# Patient Record
Sex: Male | Born: 1940 | Race: Black or African American | Hispanic: No | Smoking: Former smoker
Health system: Southern US, Community
[De-identification: ages and names within clinical notes are randomized; demographics above are authoritative.]

## PROBLEM LIST (undated history)

## (undated) DIAGNOSIS — M1710 Unilateral primary osteoarthritis, unspecified knee: Secondary | ICD-10-CM

## (undated) DIAGNOSIS — K5730 Diverticulosis of large intestine without perforation or abscess without bleeding: Secondary | ICD-10-CM

## (undated) DIAGNOSIS — N138 Other obstructive and reflux uropathy: Secondary | ICD-10-CM

## (undated) DIAGNOSIS — I10 Essential (primary) hypertension: Secondary | ICD-10-CM

## (undated) DIAGNOSIS — Z8739 Personal history of other diseases of the musculoskeletal system and connective tissue: Secondary | ICD-10-CM

## (undated) DIAGNOSIS — M179 Osteoarthritis of knee, unspecified: Secondary | ICD-10-CM

## (undated) DIAGNOSIS — R911 Solitary pulmonary nodule: Secondary | ICD-10-CM

## (undated) DIAGNOSIS — Z87898 Personal history of other specified conditions: Secondary | ICD-10-CM

## (undated) DIAGNOSIS — I341 Nonrheumatic mitral (valve) prolapse: Secondary | ICD-10-CM

## (undated) DIAGNOSIS — N401 Enlarged prostate with lower urinary tract symptoms: Secondary | ICD-10-CM

## (undated) DIAGNOSIS — I4891 Unspecified atrial fibrillation: Secondary | ICD-10-CM

## (undated) DIAGNOSIS — Z8551 Personal history of malignant neoplasm of bladder: Secondary | ICD-10-CM

## (undated) DIAGNOSIS — I340 Nonrheumatic mitral (valve) insufficiency: Secondary | ICD-10-CM

## (undated) HISTORY — DX: Diverticulosis of large intestine without perforation or abscess without bleeding: K57.30

## (undated) HISTORY — DX: Unspecified atrial fibrillation: I48.91

## (undated) HISTORY — DX: Solitary pulmonary nodule: R91.1

## (undated) HISTORY — PX: TONSILLECTOMY: SUR1361

## (undated) HISTORY — DX: Essential (primary) hypertension: I10

---

## 1997-07-07 ENCOUNTER — Encounter: Admission: RE | Admit: 1997-07-07 | Discharge: 1997-07-07 | Payer: Self-pay | Admitting: Hematology and Oncology

## 1998-04-06 ENCOUNTER — Encounter: Admission: RE | Admit: 1998-04-06 | Discharge: 1998-04-06 | Payer: Self-pay | Admitting: Internal Medicine

## 1998-04-22 ENCOUNTER — Encounter: Admission: RE | Admit: 1998-04-22 | Discharge: 1998-04-22 | Payer: Self-pay | Admitting: Internal Medicine

## 1998-05-09 ENCOUNTER — Encounter: Admission: RE | Admit: 1998-05-09 | Discharge: 1998-05-09 | Payer: Self-pay | Admitting: Internal Medicine

## 1998-08-09 ENCOUNTER — Encounter: Admission: RE | Admit: 1998-08-09 | Discharge: 1998-08-09 | Payer: Self-pay | Admitting: Internal Medicine

## 1998-09-20 ENCOUNTER — Encounter: Admission: RE | Admit: 1998-09-20 | Discharge: 1998-09-20 | Payer: Self-pay | Admitting: Hematology and Oncology

## 1999-03-09 ENCOUNTER — Encounter: Payer: Self-pay | Admitting: Emergency Medicine

## 1999-03-09 ENCOUNTER — Emergency Department (HOSPITAL_COMMUNITY): Admission: EM | Admit: 1999-03-09 | Discharge: 1999-03-09 | Payer: Self-pay | Admitting: Emergency Medicine

## 1999-03-28 ENCOUNTER — Encounter: Admission: RE | Admit: 1999-03-28 | Discharge: 1999-03-28 | Payer: Self-pay | Admitting: Internal Medicine

## 1999-12-12 ENCOUNTER — Encounter: Admission: RE | Admit: 1999-12-12 | Discharge: 1999-12-12 | Payer: Self-pay | Admitting: Internal Medicine

## 1999-12-26 ENCOUNTER — Encounter: Admission: RE | Admit: 1999-12-26 | Discharge: 1999-12-26 | Payer: Self-pay | Admitting: Hematology and Oncology

## 2000-01-12 ENCOUNTER — Encounter: Admission: RE | Admit: 2000-01-12 | Discharge: 2000-01-12 | Payer: Self-pay | Admitting: Internal Medicine

## 2001-02-05 ENCOUNTER — Emergency Department (HOSPITAL_COMMUNITY): Admission: EM | Admit: 2001-02-05 | Discharge: 2001-02-05 | Payer: Self-pay | Admitting: Emergency Medicine

## 2001-02-26 ENCOUNTER — Encounter: Admission: RE | Admit: 2001-02-26 | Discharge: 2001-02-26 | Payer: Self-pay | Admitting: Internal Medicine

## 2001-04-02 ENCOUNTER — Encounter: Admission: RE | Admit: 2001-04-02 | Discharge: 2001-04-02 | Payer: Self-pay | Admitting: Internal Medicine

## 2001-05-21 ENCOUNTER — Encounter: Admission: RE | Admit: 2001-05-21 | Discharge: 2001-05-21 | Payer: Self-pay

## 2002-03-18 ENCOUNTER — Encounter: Admission: RE | Admit: 2002-03-18 | Discharge: 2002-03-18 | Payer: Self-pay | Admitting: Internal Medicine

## 2003-06-15 ENCOUNTER — Encounter: Admission: RE | Admit: 2003-06-15 | Discharge: 2003-06-15 | Payer: Self-pay | Admitting: Internal Medicine

## 2003-09-08 ENCOUNTER — Encounter: Admission: RE | Admit: 2003-09-08 | Discharge: 2003-09-08 | Payer: Self-pay | Admitting: Internal Medicine

## 2004-08-29 ENCOUNTER — Ambulatory Visit: Payer: Self-pay | Admitting: Internal Medicine

## 2004-09-12 ENCOUNTER — Ambulatory Visit: Payer: Self-pay | Admitting: Internal Medicine

## 2004-09-26 ENCOUNTER — Ambulatory Visit: Payer: Self-pay | Admitting: Internal Medicine

## 2004-10-18 ENCOUNTER — Ambulatory Visit: Payer: Self-pay | Admitting: Internal Medicine

## 2004-10-26 ENCOUNTER — Ambulatory Visit (HOSPITAL_COMMUNITY): Admission: RE | Admit: 2004-10-26 | Discharge: 2004-10-27 | Payer: Self-pay | Admitting: Urology

## 2004-10-26 ENCOUNTER — Encounter (INDEPENDENT_AMBULATORY_CARE_PROVIDER_SITE_OTHER): Payer: Self-pay | Admitting: Specialist

## 2004-10-26 HISTORY — PX: TRANSURETHRAL RESECTION OF BLADDER TUMOR: SHX2575

## 2004-12-20 ENCOUNTER — Ambulatory Visit: Payer: Self-pay | Admitting: Internal Medicine

## 2005-01-03 ENCOUNTER — Ambulatory Visit: Payer: Self-pay | Admitting: Internal Medicine

## 2005-04-17 ENCOUNTER — Ambulatory Visit: Payer: Self-pay | Admitting: Internal Medicine

## 2005-10-12 ENCOUNTER — Ambulatory Visit: Payer: Self-pay | Admitting: Hospitalist

## 2005-10-19 ENCOUNTER — Ambulatory Visit: Payer: Self-pay | Admitting: Internal Medicine

## 2006-02-01 DIAGNOSIS — E7800 Pure hypercholesterolemia, unspecified: Secondary | ICD-10-CM | POA: Insufficient documentation

## 2006-02-01 DIAGNOSIS — C679 Malignant neoplasm of bladder, unspecified: Secondary | ICD-10-CM

## 2006-02-01 DIAGNOSIS — I10 Essential (primary) hypertension: Secondary | ICD-10-CM

## 2006-02-01 DIAGNOSIS — M109 Gout, unspecified: Secondary | ICD-10-CM

## 2006-04-10 ENCOUNTER — Telehealth: Payer: Self-pay | Admitting: *Deleted

## 2006-04-26 ENCOUNTER — Encounter (INDEPENDENT_AMBULATORY_CARE_PROVIDER_SITE_OTHER): Payer: Self-pay | Admitting: *Deleted

## 2006-04-26 ENCOUNTER — Ambulatory Visit: Payer: Self-pay | Admitting: Internal Medicine

## 2006-04-26 DIAGNOSIS — K5730 Diverticulosis of large intestine without perforation or abscess without bleeding: Secondary | ICD-10-CM | POA: Insufficient documentation

## 2006-04-26 LAB — CONVERTED CEMR LAB
Albumin: 4.4 g/dL (ref 3.5–5.2)
Alkaline Phosphatase: 77 units/L (ref 39–117)
CO2: 25 meq/L (ref 19–32)
LDL Cholesterol: 112 mg/dL — ABNORMAL HIGH (ref 0–99)
Sodium: 142 meq/L (ref 135–145)
Total Bilirubin: 0.5 mg/dL (ref 0.3–1.2)
Total CHOL/HDL Ratio: 2.3
Total Protein: 8 g/dL (ref 6.0–8.3)
VLDL: 20 mg/dL (ref 0–40)

## 2006-05-03 ENCOUNTER — Encounter (INDEPENDENT_AMBULATORY_CARE_PROVIDER_SITE_OTHER): Payer: Self-pay | Admitting: *Deleted

## 2006-05-03 ENCOUNTER — Ambulatory Visit: Payer: Self-pay | Admitting: Internal Medicine

## 2006-05-03 LAB — CONVERTED CEMR LAB
CO2: 24 meq/L (ref 19–32)
Glucose, Bld: 133 mg/dL — ABNORMAL HIGH (ref 70–99)
Potassium: 3.4 meq/L — ABNORMAL LOW (ref 3.5–5.3)
Sodium: 136 meq/L (ref 135–145)

## 2006-05-17 ENCOUNTER — Encounter (INDEPENDENT_AMBULATORY_CARE_PROVIDER_SITE_OTHER): Payer: Self-pay | Admitting: *Deleted

## 2006-05-17 ENCOUNTER — Ambulatory Visit: Payer: Self-pay | Admitting: Hospitalist

## 2006-05-17 LAB — CONVERTED CEMR LAB
BUN: 15 mg/dL (ref 6–23)
Calcium: 9.6 mg/dL (ref 8.4–10.5)
Creatinine, Ser: 0.9 mg/dL (ref 0.40–1.50)
Potassium: 3.5 meq/L (ref 3.5–5.3)
Sodium: 140 meq/L (ref 135–145)

## 2007-02-07 ENCOUNTER — Ambulatory Visit: Payer: Self-pay | Admitting: Internal Medicine

## 2007-02-07 LAB — CONVERTED CEMR LAB
CO2: 27 meq/L (ref 19–32)
Calcium: 9.5 mg/dL (ref 8.4–10.5)
Chloride: 102 meq/L (ref 96–112)
Creatinine, Ser: 1.1 mg/dL (ref 0.40–1.50)
HCT: 39.7 % (ref 39.0–52.0)
Hemoglobin: 13.6 g/dL (ref 13.0–17.0)
MCV: 90.2 fL (ref 78.0–100.0)
Monocytes Absolute: 0.7 10*3/uL (ref 0.1–1.0)
Monocytes Relative: 11 % (ref 3–12)
Neutrophils Relative %: 58 % (ref 43–77)
Platelets: 338 10*3/uL (ref 150–400)
Sodium: 142 meq/L (ref 135–145)

## 2007-06-06 ENCOUNTER — Encounter: Payer: Self-pay | Admitting: Internal Medicine

## 2007-11-17 ENCOUNTER — Encounter: Payer: Self-pay | Admitting: Internal Medicine

## 2007-11-26 ENCOUNTER — Ambulatory Visit: Payer: Self-pay | Admitting: Internal Medicine

## 2007-11-26 DIAGNOSIS — H9319 Tinnitus, unspecified ear: Secondary | ICD-10-CM | POA: Insufficient documentation

## 2007-11-26 DIAGNOSIS — L600 Ingrowing nail: Secondary | ICD-10-CM | POA: Insufficient documentation

## 2007-12-03 ENCOUNTER — Ambulatory Visit: Payer: Self-pay | Admitting: Internal Medicine

## 2007-12-03 DIAGNOSIS — E876 Hypokalemia: Secondary | ICD-10-CM

## 2007-12-04 LAB — CONVERTED CEMR LAB
BUN: 18 mg/dL (ref 6–23)
CO2: 27 meq/L (ref 19–32)
Chloride: 100 meq/L (ref 96–112)
Cholesterol: 171 mg/dL (ref 0–200)
Glucose, Bld: 89 mg/dL (ref 70–99)
Sodium: 141 meq/L (ref 135–145)
Triglycerides: 164 mg/dL — ABNORMAL HIGH (ref <150)
VLDL: 33 mg/dL (ref 0–40)

## 2007-12-10 ENCOUNTER — Ambulatory Visit: Payer: Self-pay | Admitting: Gastroenterology

## 2007-12-11 ENCOUNTER — Encounter: Payer: Self-pay | Admitting: Gastroenterology

## 2007-12-17 ENCOUNTER — Encounter: Payer: Self-pay | Admitting: Internal Medicine

## 2007-12-17 ENCOUNTER — Ambulatory Visit: Payer: Self-pay | Admitting: *Deleted

## 2007-12-17 LAB — CONVERTED CEMR LAB
BUN: 17 mg/dL (ref 6–23)
CO2: 27 meq/L (ref 19–32)
Calcium: 9.1 mg/dL (ref 8.4–10.5)
Potassium: 4.4 meq/L (ref 3.5–5.3)

## 2008-05-05 ENCOUNTER — Ambulatory Visit: Payer: Self-pay | Admitting: Internal Medicine

## 2008-05-05 LAB — CONVERTED CEMR LAB
BUN: 16 mg/dL (ref 6–23)
CO2: 26 meq/L (ref 19–32)
Creatinine, Ser: 0.93 mg/dL (ref 0.40–1.50)
GFR calc non Af Amer: >60 mL/min (ref >60)
Potassium: 3.8 meq/L (ref 3.5–5.3)

## 2008-10-05 ENCOUNTER — Ambulatory Visit: Payer: Self-pay | Admitting: Internal Medicine

## 2008-10-06 ENCOUNTER — Ambulatory Visit: Payer: Self-pay | Admitting: Infectious Diseases

## 2008-10-08 LAB — CONVERTED CEMR LAB
ALT: 23 units/L (ref 0–53)
Alkaline Phosphatase: 63 units/L (ref 39–117)
BUN: 13 mg/dL (ref 6–23)
Chloride: 102 meq/L (ref 96–112)
Cholesterol, target level: 200 mg/dL
Cholesterol: 225 mg/dL — ABNORMAL HIGH (ref 0–200)
Glucose, Bld: 86 mg/dL (ref 70–99)
LDL Goal: 160 mg/dL
Triglycerides: 74 mg/dL (ref <150)

## 2009-02-23 ENCOUNTER — Ambulatory Visit: Payer: Self-pay | Admitting: Internal Medicine

## 2009-02-23 LAB — CONVERTED CEMR LAB
BUN: 12 mg/dL (ref 6–23)
Glucose, Bld: 85 mg/dL (ref 70–99)
LDL Goal: 160 mg/dL
Potassium: 4.1 meq/L (ref 3.5–5.3)

## 2009-04-02 ENCOUNTER — Emergency Department (HOSPITAL_COMMUNITY): Admission: EM | Admit: 2009-04-02 | Discharge: 2009-04-02 | Payer: Self-pay | Admitting: Emergency Medicine

## 2009-04-24 ENCOUNTER — Inpatient Hospital Stay (HOSPITAL_COMMUNITY): Admission: EM | Admit: 2009-04-24 | Discharge: 2009-04-25 | Payer: Self-pay | Admitting: Emergency Medicine

## 2009-04-24 ENCOUNTER — Encounter: Payer: Self-pay | Admitting: Internal Medicine

## 2009-04-24 ENCOUNTER — Ambulatory Visit: Payer: Self-pay | Admitting: Infectious Diseases

## 2009-04-24 DIAGNOSIS — D649 Anemia, unspecified: Secondary | ICD-10-CM | POA: Insufficient documentation

## 2009-04-24 DIAGNOSIS — K921 Melena: Secondary | ICD-10-CM | POA: Insufficient documentation

## 2009-04-24 DIAGNOSIS — R339 Retention of urine, unspecified: Secondary | ICD-10-CM

## 2009-04-24 LAB — CONVERTED CEMR LAB
AST: 19 units/L
Albumin: 3 g/dL
Alkaline Phosphatase: 55 units/L
BUN: 48 mg/dL
CO2: 24 meq/L
Calcium: 8.2 mg/dL
Cholesterol: 227 mg/dL
Creatinine, Ser: 4.78 mg/dL
HCT: 35 %
HDL: 79 mg/dL
Iron: 14 ug/dL
LDL Cholesterol: 126 mg/dL
MCV: 95.3 fL
Potassium: 2.9 meq/L
Sodium: 136 meq/L
TIBC: 324 ug/dL
Triglyceride fasting, serum: 108 mg/dL
Vitamin B-12: 362 pg/mL

## 2009-04-25 ENCOUNTER — Encounter: Payer: Self-pay | Admitting: Internal Medicine

## 2009-04-25 LAB — CONVERTED CEMR LAB
Creatinine, Ser: 1.71 mg/dL
HCT: 32.1 %
Hemoglobin: 10.9 g/dL
WBC, blood: 4.9 10*3/uL

## 2009-04-27 ENCOUNTER — Ambulatory Visit: Payer: Self-pay | Admitting: Internal Medicine

## 2009-04-27 ENCOUNTER — Ambulatory Visit (HOSPITAL_COMMUNITY): Admission: RE | Admit: 2009-04-27 | Discharge: 2009-04-27 | Payer: Self-pay | Admitting: Internal Medicine

## 2009-04-27 DIAGNOSIS — R918 Other nonspecific abnormal finding of lung field: Secondary | ICD-10-CM

## 2009-04-27 DIAGNOSIS — I4891 Unspecified atrial fibrillation: Secondary | ICD-10-CM | POA: Insufficient documentation

## 2009-04-27 DIAGNOSIS — N179 Acute kidney failure, unspecified: Secondary | ICD-10-CM | POA: Insufficient documentation

## 2009-04-27 LAB — CONVERTED CEMR LAB
Creatinine, Ser: 0.96 mg/dL (ref 0.40–1.50)
Free T4: 1.15 ng/dL (ref 0.80–1.80)
Glucose, Bld: 62 mg/dL — ABNORMAL LOW (ref 70–99)
Potassium: 4.3 meq/L (ref 3.5–5.3)

## 2009-04-30 ENCOUNTER — Emergency Department (HOSPITAL_COMMUNITY): Admission: EM | Admit: 2009-04-30 | Discharge: 2009-04-30 | Payer: Self-pay | Admitting: Emergency Medicine

## 2009-05-01 ENCOUNTER — Emergency Department (HOSPITAL_COMMUNITY): Admission: EM | Admit: 2009-05-01 | Discharge: 2009-05-01 | Payer: Self-pay | Admitting: Emergency Medicine

## 2009-05-03 ENCOUNTER — Encounter: Payer: Self-pay | Admitting: Internal Medicine

## 2009-05-03 ENCOUNTER — Ambulatory Visit (HOSPITAL_COMMUNITY): Admission: RE | Admit: 2009-05-03 | Discharge: 2009-05-03 | Payer: Self-pay | Admitting: Internal Medicine

## 2009-05-03 HISTORY — PX: TRANSTHORACIC ECHOCARDIOGRAM: SHX275

## 2009-05-04 ENCOUNTER — Ambulatory Visit: Payer: Self-pay | Admitting: Internal Medicine

## 2009-05-04 DIAGNOSIS — F1010 Alcohol abuse, uncomplicated: Secondary | ICD-10-CM

## 2009-05-12 ENCOUNTER — Encounter: Payer: Self-pay | Admitting: Internal Medicine

## 2009-06-03 ENCOUNTER — Encounter: Payer: Self-pay | Admitting: Internal Medicine

## 2009-06-17 ENCOUNTER — Encounter: Payer: Self-pay | Admitting: Internal Medicine

## 2009-06-26 ENCOUNTER — Telehealth: Payer: Self-pay | Admitting: Internal Medicine

## 2009-10-26 ENCOUNTER — Telehealth: Payer: Self-pay | Admitting: Internal Medicine

## 2009-11-01 ENCOUNTER — Ambulatory Visit: Payer: Self-pay | Admitting: Internal Medicine

## 2009-11-01 LAB — CONVERTED CEMR LAB
ALT: 19 units/L (ref 0–53)
Albumin: 4.1 g/dL (ref 3.5–5.2)
CO2: 26 meq/L (ref 19–32)
Calcium: 9.2 mg/dL (ref 8.4–10.5)
Chloride: 104 meq/L (ref 96–112)
Creatinine, Ser: 0.84 mg/dL (ref 0.40–1.50)
Eosinophils Relative: 4 % (ref 0–5)
Glucose, Bld: 85 mg/dL (ref 70–99)
MCHC: 34.9 g/dL (ref 30.0–36.0)
Monocytes Relative: 9 % (ref 3–12)
Neutro Abs: 2.6 10*3/uL (ref 1.7–7.7)
Platelets: 248 10*3/uL (ref 150–400)
Potassium: 3.6 meq/L (ref 3.5–5.3)
RBC: 4.61 M/uL (ref 4.22–5.81)
Total Bilirubin: 0.4 mg/dL (ref 0.3–1.2)
Total Protein: 6.8 g/dL (ref 6.0–8.3)
WBC: 4.6 10*3/uL (ref 4.0–10.5)

## 2010-01-25 ENCOUNTER — Ambulatory Visit
Admission: RE | Admit: 2010-01-25 | Discharge: 2010-01-25 | Payer: Self-pay | Source: Home / Self Care | Attending: Internal Medicine | Admitting: Internal Medicine

## 2010-01-25 LAB — CONVERTED CEMR LAB
BUN: 16 mg/dL (ref 6–23)
CO2: 27 meq/L (ref 19–32)
Creatinine, Ser: 1.05 mg/dL (ref 0.40–1.50)
Sodium: 142 meq/L (ref 135–145)

## 2010-02-21 NOTE — Assessment & Plan Note (Signed)
Summary: ACUTE/JOINES/1 WEEK RECHECK/CH   Vital Signs:  Patient profile:   70 year old male Height:      73 inches (185.42 cm) Weight:      157 pounds (72.91 kg) BMI:     20.79 Temp:     97.8 degrees F (36.56 degrees C) oral Pulse rate:   58 / minute BP sitting:   117 / 77  (right arm) Cuff size:   regular  Vitals Entered By: Theotis Barrio NT II (May 04, 2009 11:03 AM) CC: FOLLOW UP APPT / WAS TOLD TO COME BACK TODAY Is Patient Diabetic? No Pain Assessment Patient in pain? no      Nutritional Status BMI of 19 -24 = normal  Have you ever been in a relationship where you felt threatened, hurt or afraid?No   Does patient need assistance? Functional Status Self care Ambulation Normal Comments FOLLOW UP APPT /  WAS TOLD TO COME BACK TODAY.   Primary Care Provider:  Margarito Liner MD  CC:  FOLLOW UP APPT / WAS TOLD TO COME BACK TODAY.  History of Present Illness: [name] who was admitted to teaching service with urinary retention secondary to BPH and followed up in clinic by Dr. Meredith Pel is back for follow up. He was noted to have A fib on last visit and Dr. Meredith Pel wanted to follow up after starting him on metoprolol. He is doing well and has no complains. He does not report any chest pain, nausea or vomiting. He says his cathater is doing fine. He actually had gone to ED and had it removed but subsequently redeveloped retention of urine and was reinserted foley. He also was started on macrobid for UTI (7-10 WBC).  His new foley is working fine. He has no other complains.   Preventive Screening-Counseling & Management  Alcohol-Tobacco     Alcohol drinks/day: 2     Alcohol type: Whiskey     Smoking Status: quit     Year Quit: 2004  Caffeine-Diet-Exercise     Caffeine use/day: 1     Does Patient Exercise: yes     Type of exercise: Walking  Current Medications (verified): 1)  Norvasc 10 Mg Tabs (Amlodipine Besylate) .... Take 1 Tablet By Mouth Once A Day 2)   Colchicine 0.6 Mg Tabs (Colchicine) .... Take 1 Tablet By Mouth Once A Day 3)  Toprol Xl 25 Mg Xr24h-Tab (Metoprolol Succinate) .... Take 1 Tablet By Mouth Once A Day 4)  Macrobid 100 Mg Caps (Nitrofurantoin Monohyd Macro) .... Take 1 Tablet By Mouth Two Times A Day  Allergies (verified): No Known Drug Allergies  Past History:  Past Medical History: Last updated: 04/27/2009 Diverticulosis, colon Gout Hypertension Benign prostatic hypertrophy: bx neg 10/06 (Dr. Annabell Howells) Bladder CA (Dr. Annabell Howells) H/o tobacco use:  quit August 2005 Acute renal failure due to urinary obstruction 04/24/2009, resolved following Foley catheter placement Small right lung nodules by chest CT Acute urinary obstruction 04/24/2009, urology evaluation scheduled Anemia 04/24/2009 Hemoccult positive stool 04/24/2009  Past Surgical History: Last updated: 04/27/2009 TUBT for bladder CA Oct 2006  Family History: Last updated: 05/05/2008 No FH colon cancer. Aunt had diabetes. No FH early MI.  Social History: Last updated: 05/05/2008 Retired; worked in [employer]. Married; 5 children by first wife.  Risk Factors: Alcohol Use: 2 (05/04/2009) Caffeine Use: 1 (05/04/2009) Exercise: yes (05/04/2009)  Risk Factors: Smoking Status: quit (05/04/2009)  Review of Systems      See HPI  Physical Exam  General:  alert,  no distress Head:  atraumatic.   Eyes:  pupils equal, pupils round, and pupils reactive to light.   Ears:  no external deformities.   Nose:  no external erythema.   Mouth:  pharynx pink and moist.   Neck:  No deformities, masses, or tenderness noted. Lungs:  Normal respiratory effort, chest expands symmetrically. Lungs are clear to auscultation, no crackles or wheezes. Heart:  Irregular rate and regular rhythm. S1 and S2 normal without gallop, murmur, click, rub or other extra sounds. Abdomen:  Bowel sounds positive,abdomen soft and non-tender without masses, organomegaly or hernias noted. foley  appears fine.  Neurologic:  No cranial nerve deficits noted. Station and gait are normal. Plantar reflexes are down-going bilaterally. DTRs are symmetrical throughout. Sensory, motor and coordinative functions appear intact.   Impression & Recommendations:  Problem # 1:  ATRIAL FIBRILLATION (ICD-427.31) Now rate controlled. [place] score of 1 so no anticoagulation.  His updated medication list for this problem includes:    Norvasc 10 Mg Tabs (Amlodipine besylate) .[name]... Take 1 tablet by mouth once a day    Toprol Xl 25 Mg Xr24h-tab (Metoprolol succinate) .[name]... Take 1 tablet by mouth once a day  Problem # 2:  HYPERTENSION (ICD-401.9) BP well controlled. NO change in meds made.  His updated medication list for this problem includes:    Norvasc 10 Mg Tabs (Amlodipine besylate) .[name]... Take 1 tablet by mouth once a day    Toprol Xl 25 Mg Xr24h-tab (Metoprolol succinate) .[name]... Take 1 tablet by mouth once a day  BP today: 117/77 Prior BP: 134/90 (04/27/2009)  Prior 10 Yr Risk Heart Disease: 22 % (04/27/2009)  Labs Reviewed: K+: 4.3 (04/27/2009) Creat: : 0.96 (04/27/2009)   Chol: 227 (04/24/2009)   HDL: 79 (04/24/2009)   LDL: 126 (04/24/2009)   TG: 108 (04/24/2009)  Problem # 3:  HYPERCHOLESTEROLEMIA, HX OF (ICD-272.0) at goal. If he were to have any CAD, then he will need aggressive management.  Labs Reviewed: SGOT: 19 (04/24/2009)   SGPT: 15 (04/24/2009)  Lipid Goals: Chol Goal: 200 (10/08/2008)   HDL Goal: 40 (10/08/2008)   LDL Goal: 160 (02/23/2009)   TG Goal: 150 (10/08/2008)  Prior 10 Yr Risk Heart Disease: 22 % (04/27/2009)   HDL:79 (04/24/2009), 89 (10/06/2008)  LDL:126 (04/24/2009), 121 (10/06/2008)  Chol:227 (04/24/2009), 225 (10/06/2008)  Trig:108 (04/24/2009), 74 (10/06/2008)  Problem # 4:  URINARY RETENTION (ICD-788.20) has foley cathater now. I talked to [facility] and confirmed his appointment. I talked to patient and he understands that he likely to need surgery for  definitive management of his problem.   Problem # 5:  ALCOHOL ABUSE (ICD-305.00) 3 bears a day for last 55  years. He does not beleive he is abusing alcohol but he is fairly dependent on it. I asked him to stop using alchol but he is not ready for now.   Complete Medication List: 1)  Norvasc 10 Mg Tabs (Amlodipine besylate) .... Take 1 tablet by mouth once a day 2)  Colchicine 0.6 Mg Tabs (Colchicine) .... Take 1 tablet by mouth once a day 3)  Toprol Xl 25 Mg Xr24h-tab (Metoprolol succinate) .... Take 1 tablet by mouth once a day 4)  Macrobid 100 Mg Caps (Nitrofurantoin monohyd macro) .... Take 1 tablet by mouth two times a day  Patient Instructions: 1)  Please schedule a follow-up appointment in 1 month. 2)  Your appointment is with Dr. Wilson Singer on 04/18 at 1:45 PM.    Prevention & Chronic Care Immunizations  Influenza vaccine: Not documented   Influenza vaccine deferral: Refused  (02/23/2009)    Tetanus booster: Not documented   Td booster deferral: Refused  (02/23/2009)    Pneumococcal vaccine: Not documented   Pneumococcal vaccine deferral: Refused  (02/23/2009)    H. zoster vaccine: Not documented   H. zoster vaccine deferral: Refused  (02/23/2009)  Colorectal Screening   Hemoccult: positive  (04/24/2009)   Hemoccult action/deferral: Ordered  (02/23/2009)    Colonoscopy: Not documented   Colonoscopy action/deferral: Refused  (02/23/2009)  Other Screening   PSA: Not documented   PSA action/deferral: Discussed-PSA declined  (10/05/2008)   Smoking status: quit  (05/04/2009)  Lipids   Total Cholesterol: 227  (04/24/2009)   Lipid panel action/deferral: Lipid Panel ordered   LDL: 126  (04/24/2009)   LDL Direct: Not documented   HDL: 79  (04/24/2009)   Triglycerides: 74  (10/06/2008)    SGOT (AST): 19  (04/24/2009)   BMP action: Ordered   SGPT (ALT): 15  (04/24/2009)   Alkaline phosphatase: 55  (04/24/2009)   Total bilirubin: 0.9  (04/24/2009)  Hypertension    Last Blood Pressure: 117 / 77  (05/04/2009)   Serum creatinine: 0.96  (04/27/2009)   BMP action: Ordered   Serum potassium 4.3  (04/27/2009)  Self-Management Support :   Personal Goals (by the next clinic visit) :      Personal blood pressure goal: 140/90  (10/05/2008)     Personal LDL goal: 160  (02/23/2009)    Patient will work on the following items until the next clinic visit to reach self-care goals:     Medications and monitoring: take my medicines every day  (05/04/2009)     Eating: drink diet soda or water instead of juice or soda, eat more vegetables, use fresh or frozen vegetables, eat foods that are low in salt, eat baked foods instead of fried foods, eat fruit for snacks and desserts, limit or avoid alcohol  (05/04/2009)     Activity: take a 30 minute walk every day  (05/04/2009)    Hypertension self-management support: Resources for patients handout  (05/04/2009)    Lipid self-management support: Resources for patients handout  (05/04/2009)        Resource handout printed.

## 2010-02-21 NOTE — Miscellaneous (Signed)
Summary: hospital discharge (acute urinary retention)  Hospital Discharge  Date of admission: 04/24/2009  Date of discharge: 04/25/2009  Brief reason for admission/active problems: ARF due to acute urinary retention  Followup needed: Patient was in hurry to leave the hospital as wife had stroke.  - BP- holding lasix and lisinopril due to acute renal failure and hypokalemia.   - ARF- follow up with repeat BMET. Cr at admission was >4 due to acute rentention. Was 1.7 at d/c - Anemia- Hb 10.7 with FOBT +ve in the hospital (anemia panel shows AOCD)  - Chest mass- seen on CXR- followed up with CT which showed pulmonary veins joined together to give appearance of mass. Yearly follow up reccomended - Acute urinary rentention- secondary to BPH. Has indwelling Foley in. [facility] to call him with an appointment for furhter management.  - Hypokalemia- got hypokalemic in the hospital due to excessive diuresis (> 5 litres out after putting Foley catheter)    The medication and problem lists have been updated.  Please see the dictated discharge summary for details.      Patient Instructions: 1)  -Follow up in Outpatient clinic  at 9:10 am on Wednesday (04/27/2009) 2)  -Dr. Annabell Howells will call you with an appointment in his office.  3)  It is not healthy  for men to drink more than 2-3 drinks per day or for women to drink more than 1-2 drinks per day.

## 2010-02-21 NOTE — Progress Notes (Signed)
  Phone Note Outgoing Call   Call placed by: Margarito Liner MD,  October 26, 2009 3:59 PM Summary of Call: I received refill requests for furosemide, benazepril, amlodipine, and metoprolol; I called patient and he confirmed that he has been taking all four medications since his last visit without any apparent problems or symptoms.  Will refill as requested.  He has an appointment with me next week.    New/Updated Medications: BENAZEPRIL HCL 40 MG TABS (BENAZEPRIL HCL) Take 1 tablet by mouth once a day FUROSEMIDE 40 MG TABS (FUROSEMIDE) Take 1 tablet by mouth once a day

## 2010-02-21 NOTE — Progress Notes (Signed)
Summary: Converted 05/02/2009 flag [id number] colonoscopy appt  ---- Converted from flag ---- ---- 04/28/2009 12:16 PM, Mamie Hague NT II wrote: PATIENT WAS SET UP FOR ONE IN 11-09 BUT AT THAT IME HE SAID IT COST TO MUCH  SO HE DID NOT HAVE IT DONE, I CONFIRMED THIS WITH [facility] OFFICIE.  MH  ---- 04/27/2009 5:22 PM, Margarito Liner MD wrote: Please request report of last colonoscopy.  Thanks, Dorene Sorrow ------------------------------

## 2010-02-21 NOTE — Assessment & Plan Note (Signed)
Summary: EST-CK/FU/MEDS/CFB   Vital Signs:  Patient profile:   70 year old male Height:      73 inches Weight:      154.0 pounds BMI:     20.39 Temp:     97.6 degrees F oral Pulse rate:   63 / minute BP sitting:   121 / 74  (right arm)  Vitals Entered By: Filomena Jungling NT II (February 23, 2009 12:10 PM) Is Patient Diabetic? No Pain Assessment Patient in pain? no      Nutritional Status BMI of 19 -24 = normal  Have you ever been in a relationship where you felt threatened, hurt or afraid?No   Does patient need assistance? Functional Status Self care Ambulation Normal    History of Present Illness: Patient returns for follow-up of his hypertension, hyperlipidemia, and other chronic medical problems.  Today he is doing well, with no complaints.  He reports that he is compliant with his medications.  Preventive Screening-Counseling & Management  Alcohol-Tobacco     Alcohol drinks/day: 2     Alcohol type: Whiskey     Smoking Status: quit     Year Quit: 2004  Caffeine-Diet-Exercise     Caffeine use/day: 1     Does Patient Exercise: yes     Type of exercise: Walking  Current Medications (verified): 1)  Lotensin 40 Mg Tabs (Benazepril Hcl) .... Take 1 Tablet By Mouth Once A Day 2)  Lasix 40 Mg Tabs (Furosemide) .... Take 1 Tablet By Mouth Once A Day 3)  Norvasc 10 Mg Tabs (Amlodipine Besylate) .... Take 1 Tablet By Mouth Once A Day 4)  Clonidine Hcl 0.1 Mg Tabs (Clonidine Hcl) .... Take 1 Tablet By Mouth Two Times A Day 5)  Colchicine 0.6 Mg Tabs (Colchicine) .... Take 1 Tablet By Mouth Once A Day  Allergies (verified): No Known Drug Allergies  Review of Systems General:  Denies chills, fever, and sweats. CV:  Denies chest pain or discomfort, difficulty breathing at night, difficulty breathing while lying down, and swelling of feet. Resp:  Denies chest discomfort and shortness of breath. GI:  Denies abdominal pain, bloody stools, dark tarry stools, nausea, and  vomiting. GU:  Denies dysuria. MS:  Denies muscle aches and cramps. Psych:  Complains of anxiety and depression.  Physical Exam  General:  alert, no distress Lungs:  normal respiratory effort, normal breath sounds, no crackles, and no wheezes.   Heart:  normal rate, regular rhythm, no murmur, no gallop, and no rub.   Abdomen:  soft, non-tender, normal bowel sounds, no hepatomegaly, and no splenomegaly.   Extremities:  no edema   Impression & Recommendations:  Problem # 1:  HYPERTENSION (ICD-401.9) Patient's blood pressure is well controlled on current regimen.  Plan is to continue current antihypertensive medications, and follow up blood pressure at next visit.   His updated medication list for this problem includes:    Lotensin 40 Mg Tabs (Benazepril hcl) .[name]... Take 1 tablet by mouth once a day    Lasix 40 Mg Tabs (Furosemide) .[name]... Take 1 tablet by mouth once a day    Norvasc 10 Mg Tabs (Amlodipine besylate) .[name]... Take 1 tablet by mouth once a day    Clonidine Hcl 0.1 Mg Tabs (Clonidine hcl) .[name]... Take 1 tablet by mouth two times a day  BP today: 121/74 Prior BP: 122/76 (10/05/2008)  10 Yr Risk Heart Disease: 14 % Prior 10 Yr Risk Heart Disease: 14 % (10/08/2008)  Labs Reviewed: K+:  3.5 (10/06/2008) Creat: : 0.87 (10/06/2008)   Chol: 225 (10/06/2008)   HDL: 89 (10/06/2008)   LDL: 121 (10/06/2008)   TG: 74 (10/06/2008)  Orders: T-Basic Metabolic Panel [id number])  Problem # 2:  HYPERCHOLESTEROLEMIA, HX OF (ICD-272.0) Labs from September show patient at goal on diet management alone.  Labs Reviewed: SGOT: 35 (10/06/2008)   SGPT: 23 (10/06/2008)  Lipid Goals: Chol Goal: 200 (10/08/2008)   HDL Goal: 40 (10/08/2008)   LDL Goal: 160 (02/23/2009)   TG Goal: 150 (10/08/2008)  10 Yr Risk Heart Disease: 14 % Prior 10 Yr Risk Heart Disease: 14 % (10/08/2008)   HDL:89 (10/06/2008), 75 (12/03/2007)  LDL:121 (10/06/2008), 63 (09/81/1914)  Chol:225 (10/06/2008), 171 (12/03/2007)   Trig:74 (10/06/2008), 164 (12/03/2007)  Problem # 3:  GOUT (ICD-274.9) Patient is doing well, with no recent gout attacks.  His updated medication list for this problem includes:    Colchicine 0.6 Mg Tabs (Colchicine) .[name]... 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 Annabell Howells.  Problem # 5:  Preventive Health Care (ICD-V70.0) Hemoccult cards with instructions were provided today.  Complete Medication List: 1)  Lotensin 40 Mg Tabs (Benazepril hcl) .... Take 1 tablet by mouth once a day 2)  Lasix 40 Mg Tabs (Furosemide) .... Take 1 tablet by mouth once a day 3)  Norvasc 10 Mg Tabs (Amlodipine besylate) .... Take 1 tablet by mouth once a day 4)  Clonidine Hcl 0.1 Mg Tabs (Clonidine hcl) .... Take 1 tablet by mouth two times a day 5)  Colchicine 0.6 Mg Tabs (Colchicine) .... Take 1 tablet by mouth once a day  Other Orders: T-Hemoccult Card-Multiple (take home) ([id number])  Patient Instructions: 1)  Please schedule a follow-up appointment in 6 months.  Prevention & Chronic Care Immunizations   Influenza vaccine: Not documented   Influenza vaccine deferral: Refused  (02/23/2009)    Tetanus booster: Not documented   Td booster deferral: Refused  (02/23/2009)    Pneumococcal vaccine: Not documented   Pneumococcal vaccine deferral: Refused  (02/23/2009)    H. zoster vaccine: Not documented   H. zoster vaccine deferral: Refused  (02/23/2009)  Colorectal Screening   Hemoccult: Not documented   Hemoccult action/deferral: Ordered  (02/23/2009)    Colonoscopy: Not documented   Colonoscopy action/deferral: Refused  (02/23/2009)  Other Screening   PSA: Not documented   PSA action/deferral: Discussed-PSA declined  (10/05/2008)   Smoking status: quit  (02/23/2009)  Lipids   Total Cholesterol: 225  (10/06/2008)   Lipid panel action/deferral: Lipid Panel ordered   LDL: 121  (10/06/2008)   LDL  Direct: Not documented   HDL: 89  (10/06/2008)   Triglycerides: 74  (10/06/2008)    SGOT (AST): 35  (10/06/2008)   BMP action: Ordered   SGPT (ALT): 23  (10/06/2008)   Alkaline phosphatase: 63  (10/06/2008)   Total bilirubin: 0.4  (10/06/2008)    Lipid flowsheet reviewed?: Yes   Progress toward LDL goal: At goal  Hypertension   Last Blood Pressure: 121 / 74  (02/23/2009)   Serum creatinine: 0.87  (10/06/2008)   BMP action: Ordered   Serum potassium 3.5  (10/06/2008)    Hypertension flowsheet reviewed?: Yes   Progress toward BP goal: At goal  Self-Management Support :   Personal Goals (by the next clinic visit) :      Personal blood pressure goal: 140/90  (10/05/2008)     Personal  LDL goal: 160  (02/23/2009)    Patient will work on the following items until the next clinic visit to reach self-care goals:     Medications and monitoring: take my medicines every day  (02/23/2009)     Eating: drink diet soda or water instead of juice or soda, eat more vegetables, use fresh or frozen vegetables, eat foods that are low in salt, eat baked foods instead of fried foods  (02/23/2009)     Activity: take the stairs instead of the elevator, park at the far end of the parking lot  (02/23/2009)    Hypertension self-management support: Written self-care plan  (02/23/2009)   Hypertension self-care plan printed.    Lipid self-management support: Written self-care plan  (02/23/2009)   Lipid self-care plan printed.   Nursing Instructions: Provide Hemoccult cards with instructions (see order)    Process Orders Check Orders Results:     [facility]: Check successful Tests Sent for requisitioning (February 25, 2009 6:16 PM):     02/23/2009: [facility] -- T-Basic Metabolic Panel [id number] (signed)    Appended Document: EST-CK/FU/MEDS/CFB Patient's chart code should be established patient level 3 rather than new patient level 3.

## 2010-02-21 NOTE — Consult Note (Signed)
Summary: [facility]  [facility]   Imported By: Margie Billet 06/08/2009 10:12:50  _____________________________________________________________________  External Attachment:    Type:   Image     Comment:   External Document

## 2010-02-21 NOTE — Letter (Signed)
Summary: [facility]  [facility]   Imported By: Margie Billet 06/22/2009 14:07:00  _____________________________________________________________________  External Attachment:    Type:   Image     Comment:   External Document

## 2010-02-21 NOTE — Letter (Signed)
Summary: [facility]   Imported By: Margie Billet 05/09/2009 15:20:36  _____________________________________________________________________  External Attachment:    Type:   Image     Comment:   External Document

## 2010-02-21 NOTE — Consult Note (Signed)
Summary: [facility]  [facility]   Imported By: Margie Billet 05/20/2009 11:34:15  _____________________________________________________________________  External Attachment:    Type:   Image     Comment:   External Document

## 2010-02-21 NOTE — Miscellaneous (Signed)
Summary: Hospital Admission 04/24/09  INTERNAL MEDICINE ADMISSION HISTORY AND PHYSICAL  Intern: Dr. Scot Dock [phone] R2: Dr. Comer Locket   please do not remove from progress notes   PCP:Dr. Joines YQ:[id number] HPI: 70 yr old male comes to ED with complain of constipation and low grade abdominal pain. His abdominal pain and constipation started on 03/30. His pain is dull, diffuse and progressive. He grades it 5/10. He is compliant with medications. He did not take any laxative or stool softner. He also had large amount of alcohol intake on wednesday and could not urinate for several hours. He says now his urination has improved but still has some difficulty. He denies fever, nausea, vomiting, chest pain, headache, vision changes, lower extremity edema. He has history of bladder cancer for which he had surgery. He is followed by Dr. Annabell Howells for BPH and Hx of bladder cancer but he has not seen them for two years.   ALLERGIES: NKDA  PAST MEDICAL HISTORY: Past Medical History: Diverticulosis, colon Gout Hypertension Benign prostatic hypertrophy: bx neg 10/06 (Dr. Annabell Howells) Bladder CA (Dr. 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 Annabell Howells H/o tobacco use:  quit August 2005 alcohol abuse: 3-4 drinks of brandy each day.    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 Married; 5 children by first wife. lives with wife who recently had a stroke.   Substance use: alchol- as mentioned in PMH  Insurance: Medicare  and [employer]  FAMILY HISTORY Family History: No FH colon cancer. Aunt had diabetes. Mother died after having alzheimers. No FH early MI.   ROS: General: Denies fever, chills, weight loss/gain, fatigue, recent travel, sick contacts, diaphoresis, night sweats HEENT: Denies earache, nosebleed, sore throat CV: Denies chest pain, palpitations, dyspnea, orthopnea, PND, edema, syncope Resp: Denies dyspnea, DOE, cough, sputum, hemoptysis, wheezing GI: Denies nausea, vomiting, diarrhea, hematemesis, hematochezia, melena  GU: Denies hematuria, urinary incontinence Derm: Denies rash, hair loss Endocrine: Denies heat intolerance, cold intolerance, polyuria, polydipsia Heme/Lymph: Denies bruising, lymphadenopathy MS: Denies joint pain except right knee which is bothered by gout, joint swelling, recent trauma, muscle pain, back pain  Neuro/Eyes: Denies weakness, numbness, tremor, headache, diplopia, blurry vision, vision change, hearing loss, dizziness, tinnitus, imbalance, vertigo, neck stiffness Psych: Denies depression, anxiety, suicidal ideations, homicidal ideations, hallucinations   VITALS: T: 97.4 P:112  BP: 144/90 R:20  O2SAT: 97% ON:RA PHYSICAL EXAM: General:  alert, well-developed, and cooperative to examination.   Head:  normocephalic and atraumatic.   Eyes:  vision grossly intact in right eye, left eye can not see, no injection and anicteric.   Mouth:  pharynx pink and moist, no erythema, and no exudates.   Neck:  supple, full ROM, no thyromegaly, no JVD, and no carotid bruits.   Lungs:  normal respiratory effort, no accessory muscle use, normal breath sounds, no crackles, and no wheezes.  Heart:  tachycardia, regular rhythm, no murmur, no gallop, and no rub.   Abdomen:  tight, bloated, mild to moderate tenderness, low bowel sounds,  no guarding, no rebound tenderness, no hepatomegaly, and no splenomegaly.   Msk:  right knee joint swelling, no joint warmth, and no redness over joints.     Pulses:  2+ DP/PT pulses bilaterally Extremities:  No cyanosis, clubbing, edema  Neurologic:  alert & oriented X3, cranial nerves II-XII intact, strength normal in all extremities, sensation intact to light touch, and gait normal.   Skin:  turgor normal and no rashes.   Psych:  Oriented X3, memory intact for recent and remote, normally interactive, good eye contact, not anxious appearing, and not depressed appearing.  LABS:   ASSESSMENT AND PLAN:   WBC                                      7.4               4.0-10.5         K/uL  RBC                                      3.68       l      4.22-5.81        MIL/uL  Hemoglobin (HGB)                         12.0       l      13.0-17.0        g/dL  Hematocrit (HCT)                         35.0       l      39.0-52.0        %  MCV                                      95.3              78.0-100.0       fL  MCHC                                     34.3              30.0-36.0        g/dL  RDW                                      13.8              11.5-15.5        %  Platelet Count (PLT)                     212               150-400          K/uL   Sodium (NA)                              136  135-145          mEq/L  Potassium (K)                            2.9        l      3.5-5.1          mEq/L  Chloride                                 103               96-112           mEq/L  CO2                                      24                19-32            mEq/L  Glucose                                  98                70-99            mg/dL  BUN                                      48         h      6-23             mg/dL  Creatinine                               4.78       h      0.4-1.5          mg/dL  GFR, Est Non African American            12         l      >60              mL/min  GFR, Est African American                15         l      >60              mL/min    Oversized comment, see footnote  1  Bilirubin, Total                          0.9               0.3-1.2          mg/dL  Alkaline Phosphatase                     55                39-117           U/L  SGOT (AST)  19                0-37             U/L  SGPT (ALT)                               15                0-53             U/L  Total  Protein                           6.8               6.0-8.3          g/dL  Albumin-Blood                            3.0        l      3.5-5.2          g/dL  Calcium                                  8.2        l      8.4-10.5         mg/dL    Color, Urine                             YELLOW            YELLOW  Appearance                               CLOUDY     a      CLEAR  Specific Gravity                         1.010             1.005-1.030  pH                                       5.0               5.0-8.0  Urine Glucose                            NEGATIVE          NEG              mg/dL  Bilirubin                                NEGATIVE          NEG  Ketones                                  NEGATIVE          NEG  mg/dL  Blood                                    TRACE      a      NEG  Protein                                  NEGATIVE          NEG              mg/dL  Urobilinogen                             0.2               0.0-1.0          mg/dL  Nitrite                                  NEGATIVE          NEG  Leukocytes                               NEGATIVE          NEG   Squamous Epithelial / LPF                RARE              RARE  WBC / HPF                                0-2               <3               WBC/hpf  RBC / HPF                                0-2               <3               RBC/hpf  Bacteria / HPF                           RARE              RARE  ABD XRAY   1.  Nonobstructive bowel gas pattern.  The majority of the stool is   in the proximal colon.  No evidence of impaction.   2.  Oval density projecting over the right heart, best seen on the   supine  view of the abdomen is likely calcified, and may be a large   osteophyte(s) extending from the thoracic spine, but this is not   certain.  Dedicated views of the thoracic spine may be useful for   further evaluation.  A/P 1. Abdominal distension:    a> Constipation: abdominal xray shows stool in proximal bowel without SBO. We will go ahead and start him on good bowel regimen that includes colace and miralax. If no stool in next 24-48  hrs consider enema.   b> urinary retention: hx of significant BPH and lack of follow up. Also hx of bladder cancer. Will get CT scan. Abdominal distetion improved remarkably after cathaterisation. More then 2L of urine was recovered.   2. ARI: likely secondary to obstructive uropathy. Will continue to have foley in for decompression. Also get CT scan to image urinary tract, bladder and prostate. We will hold his lasix for now.   3. BPH: s/p TURP. rectal exam by ED suggested enlarged prostate. Will call urology to take another look. will start on flomax.  4. Opacity over heart on abdominal xray: will get thorasic spine xray as suggested by radiologist.  5. HTN: well controlled. Cont home meds.   6. Gout: will continue on colchicine.  7. Hypokalemia: will replete K orally  8. Alchol abuse: inpatient consult. Will check B12. Has marginal anemia so also will get anemia panel. Start him on thiamine and folate supplementation.  9. VTE PROPH: lovenox   10. Code status: Full code.

## 2010-02-21 NOTE — Assessment & Plan Note (Signed)
Summary: EST-CK/FU/MEDS/CFB   Vital Signs:  Patient profile:   70 year old male Height:      73 inches Weight:      160.8 pounds BMI:     21.29 Temp:     97.2 degrees F oral Pulse rate:   74 / minute BP sitting:   124 / 86  (right arm)  Vitals Entered By: Filomena Jungling NT II (November 01, 2009 1:56 PM) CC: checkup Pain Assessment Patient in pain? no      Nutritional Status BMI of 19 -24 = normal  Have you ever been in a relationship where you felt threatened, hurt or afraid?No   Does patient need assistance? Functional Status Self care Ambulation Normal   Primary Care Provider:  Margarito Liner MD  CC:  checkup.  History of Present Illness: Patient returns for followup of his atrial fibrillation, hypertension, hypercholesterolemia, and other chronic medical problems. He has no complaints, and reports that he has been doing well. He confirmed that since his last visit he has continued to take four blood pressure medications (Norvasc, Toprol-XL, benazepril, and furosemide) and has had no apparent problems. He denies chest pain, palpitations, shortness of breath, dizziness, syncope or near syncope, and lower extremity edema.  He has had no recent gout attacks. Following his last visit here, he followed up with his urologist and his Foley catheter was removed. He reports that he has been voiding without difficulty.  Preventive Screening-Counseling & Management  Alcohol-Tobacco     Alcohol drinks/day: 2     Alcohol type: Whiskey     Smoking Status: quit     Year Quit: 2004  Caffeine-Diet-Exercise     Caffeine use/day: 1     Does Patient Exercise: yes     Type of exercise: Walking  Current Medications (verified): 1)  Norvasc 10 Mg Tabs (Amlodipine Besylate) .... Take 1 Tablet By Mouth Once A Day 2)  Colchicine 0.6 Mg Tabs (Colchicine) .... Take 1 Tablet By Mouth Once A Day 3)  Toprol Xl 25 Mg Xr24h-Tab (Metoprolol Succinate) .... Take 1 Tablet By Mouth Once A Day 4)   Benazepril Hcl 40 Mg Tabs (Benazepril Hcl) .... Take 1 Tablet By Mouth Once A Day 5)  Furosemide 40 Mg Tabs (Furosemide) .... Take 1 Tablet By Mouth Once A Day  Allergies (verified): No Known Drug Allergies  Past History:  Past Medical History: Atrial fibrillation Diverticulosis, colon Gout Hypertension Benign prostatic hypertrophy: bx neg 10/06 (Dr. Annabell Howells) Bladder CA (Dr. Annabell Howells) H/o tobacco use:  quit August 2005 Acute renal failure due to urinary obstruction 04/24/2009, resolved following Foley catheter placement Small right lung nodules by chest CT Acute urinary obstruction 04/24/2009, urology evaluation scheduled Anemia 04/24/2009 Hemoccult positive stool 04/24/2009  Review of Systems General:  Denies chills, fatigue, fever, loss of appetite, sweats, and weight loss. ENT:  Denies decreased hearing and earache; chronic tinnitus right ear; has seen ear doctor in the past. CV:  Denies chest pain or discomfort, difficulty breathing at night, difficulty breathing while lying down, fainting, fatigue, lightheadness, near fainting, palpitations, shortness of breath with exertion, and swelling of feet. Resp:  Denies chest discomfort, cough, shortness of breath, and wheezing. GI:  Denies abdominal pain, bloody stools, nausea, and vomiting. GU:  Denies urinary frequency and urinary hesitancy. MS:  Denies muscle aches and cramps.  Physical Exam  General:  alert, no distress Lungs:  normal respiratory effort, normal breath sounds, no crackles, and no wheezes.   Heart:  irregularly irregular; normal  rate; no murmur, no gallop Abdomen:  soft, non-tender, normal bowel sounds, no hepatomegaly, and no splenomegaly.   Extremities:  no edema   Impression & Recommendations:  Problem # 1:  ATRIAL FIBRILLATION (ICD-427.31) Patient's rate is controlled and he has no symptoms related to his atrial fibrillation. I discussed at length with him the issue of stroke risk and stroke prophylaxis; given  his moderately reduced ejection fraction on 2-D echocardiogram and his history of hypertension, anticoagulation with warfarin or Pradaxa would likely provide the best protection against embolic stroke.  He does not wish to take either of these medications, and prefers to use aspirin for stroke prophylaxis; he understands that aspirin will provide less protection than full anticoagulation.  His updated medication list for this problem includes:    Norvasc 10 Mg Tabs (Amlodipine besylate) .[name]... Take 1 tablet by mouth once a day    Toprol Xl 25 Mg Xr24h-tab (Metoprolol succinate) .[name]... Take 1 tablet by mouth once a day    Aspirin Ec 81 Mg Tbec (Aspirin) .[name]... Take 1 tablet by mouth once a day  Problem # 2:  HYPERTENSION (ICD-401.9) Patient's blood pressure is well controlled on current regimen; will continue as before.  His updated medication list for this problem includes:    Norvasc 10 Mg Tabs (Amlodipine besylate) .[name]... Take 1 tablet by mouth once a day    Toprol Xl 25 Mg Xr24h-tab (Metoprolol succinate) .[name]... Take 1 tablet by mouth once a day    Benazepril Hcl 40 Mg Tabs (Benazepril hcl) .[name]... Take 1 tablet by mouth once a day    Furosemide 40 Mg Tabs (Furosemide) .[name]... Take 1 tablet by mouth once a day  BP today: 124/86 Prior BP: 117/77 (05/04/2009)  Prior 10 Yr Risk Heart Disease: 22 % (04/27/2009)  Labs Reviewed: K+: 4.3 (04/27/2009) Creat: : 0.96 (04/27/2009)   Chol: 227 (04/24/2009)   HDL: 79 (04/24/2009)   LDL: 126 (04/24/2009)   TG: 108 (04/24/2009)  Problem # 3:  HYPERCHOLESTEROLEMIA, HX OF (ICD-272.0) Patient's LDL is at goal on no medications. The plan is to continue therapeutic lifestyle changes.  Labs Reviewed: SGOT: 19 (04/24/2009)   SGPT: 15 (04/24/2009)  Lipid Goals: Chol Goal: 200 (10/08/2008)   HDL Goal: 40 (10/08/2008)   LDL Goal: 160 (02/23/2009)   TG Goal: 150 (10/08/2008)  Prior 10 Yr Risk Heart Disease: 22 % (04/27/2009)   HDL:79 (04/24/2009), 89 (10/06/2008)   LDL:126 (04/24/2009), 121 (10/06/2008)  Chol:227 (04/24/2009), 225 (10/06/2008)  Trig:108 (04/24/2009), 74 (10/06/2008)  Problem # 4:  Hx of URINARY RETENTION (ICD-788.20) Patient no longer has symptoms of urinary retention. He will followup with his urologist as scheduled.  Problem # 5:  GOUT (ICD-274.9) Patient has had no recent gout flares. The plan is to continue colchicine at current dose.  His updated medication list for this problem includes:    Colchicine 0.6 Mg Tabs (Colchicine) .[name]... Take 1 tablet by mouth once a day  Problem # 6:  PULMONARY NODULE (ICD-518.89) Plan is to repeat chest CT scan within the next 6 months.  Complete Medication List: 1)  Norvasc 10 Mg Tabs (Amlodipine besylate) .... Take 1 tablet by mouth once a day 2)  Colchicine 0.6 Mg Tabs (Colchicine) .... Take 1 tablet by mouth once a day 3)  Toprol Xl 25 Mg Xr24h-tab (Metoprolol succinate) .... Take 1 tablet by mouth once a day 4)  Benazepril Hcl 40 Mg Tabs (Benazepril hcl) .... Take 1 tablet by mouth once a day 5)  Furosemide 40 Mg Tabs (Furosemide) .... Take 1 tablet by mouth once a day 6)  Aspirin Ec 81 Mg Tbec (Aspirin) .... Take 1 tablet by mouth once a day  Other Orders: T-CMP with Estimated GFR ([id number]) T-CBC w/Diff ([id number])  Patient Instructions: 1)  Please schedule a follow-up appointment in 3 months. 2)  Start aspirin 81 mg one tablet daily.  Prevention & Chronic Care Immunizations   Influenza vaccine: Not documented   Influenza vaccine deferral: Refused  (11/01/2009)    Tetanus booster: Not documented   Td booster deferral: Refused  (11/01/2009)    Pneumococcal vaccine: Not documented   Pneumococcal vaccine deferral: Refused  (11/01/2009)    H. zoster vaccine: Not documented   H. zoster vaccine deferral: Refused  (11/01/2009)  Colorectal Screening   Hemoccult: positive  (04/24/2009)   Hemoccult action/deferral: Ordered  (02/23/2009)    Colonoscopy: Not documented    Colonoscopy action/deferral: Refused  (11/01/2009)  Other Screening   PSA: Not documented   PSA action/deferral: Discussed-PSA declined  (10/05/2008)   Smoking status: quit  (11/01/2009)  Lipids   Total Cholesterol: 227  (04/24/2009)   Lipid panel action/deferral: Lipid Panel ordered   LDL: 126  (04/24/2009)   LDL Direct: Not documented   HDL: 79  (04/24/2009)   Triglycerides: 74  (10/06/2008)    SGOT (AST): 19  (04/24/2009)   BMP action: Ordered   SGPT (ALT): 15  (04/24/2009)   Alkaline phosphatase: 55  (04/24/2009)   Total bilirubin: 0.9  (04/24/2009)    Lipid flowsheet reviewed?: Yes   Progress toward LDL goal: Unchanged  Hypertension   Last Blood Pressure: 124 / 86  (11/01/2009)   Serum creatinine: 0.96  (04/27/2009)   BMP action: Ordered   Serum potassium 4.3  (04/27/2009)    Hypertension flowsheet reviewed?: Yes   Progress toward BP goal: At goal  Self-Management Support :   Personal Goals (by the next clinic visit) :      Personal blood pressure goal: 140/90  (10/05/2008)     Personal LDL goal: 160  (02/23/2009)    Patient will work on the following items until the next clinic visit to reach self-care goals:     Medications and monitoring: take my medicines every day, bring all of my medications to every visit, examine my feet every day  (11/01/2009)     Eating: drink diet soda or water instead of juice or soda, eat more vegetables, use fresh or frozen vegetables, eat foods that are low in salt, eat baked foods instead of fried foods, eat fruit for snacks and desserts, limit or avoid alcohol  (11/01/2009)     Activity: take a 30 minute walk every day  (11/01/2009)    Hypertension self-management support: Education handout, Resources for patients handout, Written self-care plan  (11/01/2009)   Hypertension self-care plan printed.   Hypertension education handout printed    Lipid self-management support: Education handout, Resources for patients handout, Written  self-care plan  (11/01/2009)   Lipid self-care plan printed.   Lipid education handout printed      Resource handout printed.  Process Orders Check Orders Results:     [facility]: Check successful Tests Sent for requisitioning (November 04, 2009 1:20 PM):     11/01/2009: [facility] -- T-CMP with Estimated GFR [[id number]] (signed)     11/01/2009: [facility] -- T-CBC w/Diff [[id number]] (signed)

## 2010-02-21 NOTE — Assessment & Plan Note (Signed)
Summary: OK TO ADD ON PER HELEN FOR ER F/U VISIT/CH   Vital Signs:  Patient profile:   70 year old male Height:      73 inches Weight:      160.4 pounds BMI:     21.24 Temp:     97.1 degrees F Pulse rate:   84 / minute BP sitting:   134 / 90  (right arm)  Vitals Entered By: Filomena Jungling NT II (April 27, 2009 10:06 AM) CC: HFU Pain Assessment Patient in pain? no      Nutritional Status BMI of 19 -24 = normal  Have you ever been in a relationship where you felt threatened, hurt or afraid?No   Does patient need assistance? Functional Status Self care Ambulation Normal   CC:  HFU.  History of Present Illness: Patient presents for followup of a recent hospitalization April 3 through April 25, 2009 at [facility] with acute urinary retention and associated acute renal failure.  A Foley catheter was placed at the time of hospital admission, and patient's creatinine, which was initially 4.78, improved to a value of 1.71 at the time of discharge. Urology consultation was obtained from Dr. Annabell Howells, and the patient was discharged home with Foley catheter in place with a followup appointment with Dr. Annabell Howells scheduled for April 18. Other problems noted during the hospitalization include anemia with a discharge hemoglobin of 10.9, Hemoccult positive stool, and small lung nodules noted on a chest CT scan.  Patient today reports that he has done well since his discharge home. His Foley catheter is draining without problems. He has no acute complaints.  Preventive Screening-Counseling & Management  Alcohol-Tobacco     Alcohol drinks/day: 2     Alcohol type: Whiskey     Smoking Status: quit     Year Quit: 2004  Caffeine-Diet-Exercise     Caffeine use/day: 1     Does Patient Exercise: yes     Type of exercise: Walking  Current Medications (verified): 1)  Norvasc 10 Mg Tabs (Amlodipine Besylate) .... Take 1 Tablet By Mouth Once A Day 2)  Clonidine Hcl 0.1 Mg Tabs (Clonidine Hcl) ....  Take 1 Tablet By Mouth Two Times A Day 3)  Colchicine 0.6 Mg Tabs (Colchicine) .... Take 1 Tablet By Mouth Once A Day  Allergies (verified): No Known Drug Allergies  Past History:  Past Medical History: Diverticulosis, colon Gout Hypertension Benign prostatic hypertrophy: bx neg 10/06 (Dr. Annabell Howells) Bladder CA (Dr. Annabell Howells) H/o tobacco use:  quit August 2005 Acute renal failure due to urinary obstruction 04/24/2009, resolved following Foley catheter placement Small right lung nodules by chest CT Acute urinary obstruction 04/24/2009, urology evaluation scheduled Anemia 04/24/2009 Hemoccult positive stool 04/24/2009  Past Surgical History: TUBT for bladder CA Oct 2006  Review of Systems General:  Denies chills, fever, and sweats. CV:  Denies chest pain or discomfort, difficulty breathing at night, difficulty breathing while lying down, lightheadness, palpitations, shortness of breath with exertion, and swelling of feet. Resp:  Denies chest discomfort and shortness of breath. GI:  Denies abdominal pain, bloody stools, nausea, and vomiting. GU:  Foley draining well. MS:  Denies muscle aches.  Physical Exam  General:  alert, no distress Lungs:  normal respiratory effort, normal breath sounds, no crackles, and no wheezes.   Heart:  irregular;no murmurs Abdomen:  soft, non-tender, normal bowel sounds, no distention, and no masses.   Extremities:  no edema   Impression & Recommendations:  Problem # 1:  ATRIAL FIBRILLATION (ICD-427.31) Patient had an irregular rhythm on exam today, and a 12-lead EKG shows atrial fibrillation with a ventricular rate of 96 and with some premature aberrantly conducted complexes.  I reviewed the EKG from patient's recent hospitalization, and it also shows atrial fibrillation.  This is apparently a new diagnosis. Patient is completely asymptomatic.  Following ambulation around the clinic hallway today his maximal heart rate was 105.  I discussed his new  diagnosis of atrial fibrillation with Dr. Riley Kill of [facility] by telephone, and the plan is to start a beta blocker (Toprol-XL) for better rate control and obtain a 2-D echocardiogram.  Patient's CHADS2 score is one, so chronic anti-coagulation would be an option as stroke prevention.  However, with his indwelling Foley and upcoming urology evaluation with possible need for a surgical procedure, I will not start anticoagulation today.  I called his urologist Dr.Wrenn, who will move his urology appointment up hopefully to this week to address the issue of urinary retention.  Patient also has hemoccult positive stool and a mild anemia, and may require GI evaluation prior to considering anti-coagulation.  I cautioned patient to call or return if he develops any symptoms including palpitations, chest pain, shortness of breath, dizziness, or other problems.  His updated medication list for this problem includes:    Norvasc 10 Mg Tabs (Amlodipine besylate) .[name]... Take 1 tablet by mouth once a day    Toprol Xl 25 Mg Xr24h-tab (Metoprolol succinate) .[name]... Take 1 tablet by mouth once a day  Orders: T-TSH ([id number]) T-T4, Free [phone]) 2 D Echo (2 D Echo)  Problem # 2:  URINARY RETENTION (ICD-788.20) Patient has a Foley catheter in place which is draining without problems. As above, he will see Dr. Annabell Howells in followup.  Problem # 3:  HYPERTENSION (ICD-401.9) Patient's blood pressure is reasonably well controlled. In order to provide better heart rate control, I added Toprol-XL 25 mg daily to his regimen. Will continue Norvasc 10 mg daily, and stop his low dose of clonidine. I do not anticipate rebound hypertension following discontinution of the clonidine since he is on a low dose.  The following medications were removed from the medication list:    Clonidine Hcl 0.1 Mg Tabs (Clonidine hcl) .[name]... Take 1 tablet by mouth two times a day His updated medication list for this problem includes:     Norvasc 10 Mg Tabs (Amlodipine besylate) .[name]... Take 1 tablet by mouth once a day    Toprol Xl 25 Mg Xr24h-tab (Metoprolol succinate) .[name]... Take 1 tablet by mouth once a day  BP today: 134/90 Prior BP: 121/74 (02/23/2009)  10 Yr Risk Heart Disease: 22 % Prior 10 Yr Risk Heart Disease: 14 % (02/23/2009)  Labs Reviewed: K+: 3.6 (04/25/2009) Creat: : 1.71 (04/25/2009)   Chol: 227 (04/24/2009)   HDL: 79 (04/24/2009)   LDL: 126 (04/24/2009)   TG: 108 (04/24/2009)  Problem # 4:  ANEMIA, NORMOCYTIC (ICD-285.9) Will recheck a CBC upon return. Patient has no symptoms of anemia. His Hemoccult positive stool suggests a GI source of blood loss, although his ferritin was normal and his anemia is normocytic.  Hgb: 10.9 (04/25/2009)   Hct: 32.1 (04/25/2009)   Platelets: 204 (04/25/2009) RBC: 4.40 (02/07/2007)   RDW: 13.8 (04/24/2009)   WBC: 4.9 (04/25/2009) MCV: 95.1 (04/25/2009)   MCHC: 34.3 (02/07/2007) Ferritin: 229 (04/24/2009) Iron: 14 (04/24/2009)   TIBC: 324 (04/24/2009)   B12: 362 (04/24/2009)   Folate: 10.0 (04/24/2009)     Problem # 5:  HEMOCCULT POSITIVE STOOL (ICD-578.1) Patient will need GI evaluation; plan is GI referral once his other acute issues are addressed.  Complete Medication List: 1)  Norvasc 10 Mg Tabs (Amlodipine besylate) .... Take 1 tablet by mouth once a day 2)  Colchicine 0.6 Mg Tabs (Colchicine) .... Take 1 tablet by mouth once a day 3)  Toprol Xl 25 Mg Xr24h-tab (Metoprolol succinate) .... Take 1 tablet by mouth once a day  Other Orders: T-Basic Metabolic Panel [id number]) 12 Lead EKG (12 Lead EKG)   Patient Instructions: 1)  Please schedule a follow-up appointment in 1 week. 2)  Stop clonidine. 3)  Start Toprol XL 25 mg once daily. 4)  Please keep appointment for echocardiogram. 5)  Call immediately if you have any palpitations, dizziness, shortness of breath, chest pain, or other new problems.  Prescriptions: TOPROL XL 25 MG XR24H-TAB (METOPROLOL  SUCCINATE) Take 1 tablet by mouth once a day  #30 x 3   Entered and Authorized by:   Margarito Liner MD   Signed by:   Margarito Liner MD on 04/27/2009   Method used:   Electronically to        [facility] [id number] (retail)       [address] [place], [place]  [address]       Ph: [phone]       Fax: [phone]   RxID:   [id number]    Prevention & Chronic Care Immunizations   Influenza vaccine: Not documented   Influenza vaccine deferral: Refused  (02/23/2009)    Tetanus booster: Not documented   Td booster deferral: Refused  (02/23/2009)    Pneumococcal vaccine: Not documented   Pneumococcal vaccine deferral: Refused  (02/23/2009)    H. zoster vaccine: Not documented   H. zoster vaccine deferral: Refused  (02/23/2009)  Colorectal Screening   Hemoccult: positive  (04/24/2009)   Hemoccult action/deferral: Ordered  (02/23/2009)    Colonoscopy: Not documented   Colonoscopy action/deferral: Refused  (02/23/2009)  Other Screening   PSA: Not documented   PSA action/deferral: Discussed-PSA declined  (10/05/2008)  Reports requested:   Last colonoscopy report requested.  Smoking status: quit  (04/27/2009)  Lipids   Total Cholesterol: 227  (04/24/2009)   Lipid panel action/deferral: Lipid Panel ordered   LDL: 126  (04/24/2009)   LDL Direct: Not documented   HDL: 79  (04/24/2009)   Triglycerides: 74  (10/06/2008)    SGOT (AST): 19  (04/24/2009)   BMP action: Ordered   SGPT (ALT): 15  (04/24/2009)   Alkaline phosphatase: 55  (04/24/2009)   Total bilirubin: 0.9  (04/24/2009)    Lipid flowsheet reviewed?: Yes   Progress toward LDL goal: At goal  Hypertension   Last Blood Pressure: 134 / 90  (04/27/2009)   Serum creatinine: 1.71  (04/25/2009)   BMP action: Ordered   Serum potassium 3.6  (04/25/2009)    Hypertension flowsheet reviewed?: Yes   Progress toward BP goal: At goal  Self-Management Support :   Personal Goals (by the  next clinic visit) :      Personal blood pressure goal: 140/90  (10/05/2008)     Personal LDL goal: 160  (02/23/2009)    Patient will work on the following items until the next clinic visit to reach self-care goals:     Medications and monitoring: take my medicines every day  (04/27/2009)     Eating: drink diet soda  or water instead of juice or soda, eat more vegetables, use fresh or frozen vegetables, eat foods that are low in salt, eat baked foods instead of fried foods, eat fruit for snacks and desserts  (04/27/2009)     Activity: take the stairs instead of the elevator, park at the far end of the parking lot  (04/27/2009)    Hypertension self-management support: Written self-care plan  (02/23/2009)    Lipid self-management support: Written self-care plan  (02/23/2009)    Nursing Instructions: Request report of last colonoscopy    Process Orders Check Orders Results:     [facility]: Check successful Tests Sent for requisitioning (April 27, 2009 7:15 PM):     04/27/2009: [facility] -- T-Basic Metabolic Panel [phone] (signed)     04/27/2009: [facility] -- T-TSH [phone] (signed)     04/27/2009: [facility] -- T-T4, [place] [[id number]] (signed)   Labs from recent hospitalization entered into flowsheet:  -  Date:  04/25/2009    BG Random: 99    BUN: 29    Creatinine: 1.71    Sodium: 138    Potassium: 3.6    Chloride: 108    CO2 Total: 24    Calcium: 8.4    HGB: 10.9    HCT: 32.1    MCV: 95.1    WBC: 4.9    Hgb: 10.9    Hct: 32.1    Platelets: 204  Date:  04/24/2009    BG Random: 98    BUN: 48    Creatinine: 4.78    Sodium: 136    Potassium: 2.9    Chloride: 103    CO2 Total: 24    SGOT (AST): 19    SGPT (ALT): 15    T. Bilirubin: 0.9    Alk Phos: 55    Calcium: 8.2    Total Protein: 6.8    Albumin: 3.0    WBC: 7.4    HGB: 12.0    HCT: 35.0    PLT: 212    MCV: 95.3    RDW:  13.8    Cholesterol: 227    LDL: 126    HDL: 79    Triglycerides: 578    Hemoccult positive    B12: 362    Folate: 10.0    Ferritin: 229    TIBC: 324    Iron: 14    Hgb: 12.0    Hct: 35.0

## 2010-02-23 NOTE — Assessment & Plan Note (Signed)
Summary: FU VISIT/DS   Vital Signs:  Patient profile:   70 year old male Height:      73 inches Weight:      169.3 pounds BMI:     22.42 Temp:     97.8 degrees F oral Pulse rate:   76 / minute BP sitting:   140 / 100  (right arm)  Vitals Entered By: Filomena Jungling NT II (January 25, 2010 9:32 AM) CC: checkup  Is Patient Diabetic? No Pain Assessment Patient in pain? no      Nutritional Status BMI of 19 -24 = normal  Have you ever been in a relationship where you felt threatened, hurt or afraid?No   Does patient need assistance? Functional Status Self care Ambulation Normal   Primary Care Provider:  Margarito Liner MD  CC:  checkup .  History of Present Illness: Patient returns today for followup of his hypertension, atrial fibrillation, hypercholesterolemia, and other chronic medical problems. He is doing well without any complaints.  He did not bring his medications to clinic today; he apparently has run out of one of his blood pressure medications, since he reports that recently he has only been taking three medications for his blood pressure rather than four medications.    Preventive Screening-Counseling & Management  Alcohol-Tobacco     Alcohol drinks/day: 2     Alcohol type: Whiskey     Smoking Status: quit     Year Quit: 2004  Caffeine-Diet-Exercise     Caffeine use/day: 1     Does Patient Exercise: yes     Type of exercise: Walking  Current Medications (verified): 1)  Norvasc 10 Mg Tabs (Amlodipine Besylate) .... Take 1 Tablet By Mouth Once A Day 2)  Colchicine 0.6 Mg Tabs (Colchicine) .... Take 1 Tablet By Mouth Once A Day 3)  Toprol Xl 25 Mg Xr24h-Tab (Metoprolol Succinate) .... Take 1 Tablet By Mouth Once A Day 4)  Benazepril Hcl 40 Mg Tabs (Benazepril Hcl) .... Take 1 Tablet By Mouth Once A Day 5)  Furosemide 40 Mg Tabs (Furosemide) .... Take 1 Tablet By Mouth Once A Day 6)  Aspirin Ec 81 Mg Tbec (Aspirin) .... Take 1 Tablet By Mouth Once A Day  Allergies  (verified): No Known Drug Allergies  Social History: Retired; worked in [employer]. Married; 5 children by first wife. 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  Review of Systems CV:  Denies chest pain or discomfort, difficulty breathing at night, difficulty breathing while lying down, lightheadness, near fainting, palpitations, shortness of breath with exertion, swelling of feet, and swelling of hands. Resp:  Denies chest discomfort, cough, and shortness of breath. GI:  Denies abdominal pain, bloody stools, dark tarry stools, nausea, and vomiting. GU:  Denies urinary hesitancy. MS:  Denies muscle aches and cramps. Psych:  Denies anxiety and depression.  Physical Exam  General:  alert, no distress Lungs:  normal respiratory effort, normal breath sounds, no crackles, and no wheezes.   Heart:  irregularly irregular; normal rate; no murmur, no S3, no S4 Extremities:  no edema   Impression & Recommendations:  Problem # 1:  HYPERTENSION (ICD-401.9) Patient's blood pressure is above goal, most likely because he has been out of one of his medications; review of his refill history suggests that he has been out of amlodipine.  I instructed him to call the clinic today and confirm his current medications, and then to refill the medication so he will be back on  his full antihypertensive regimen.  Will check a BMET today.  His updated medication list for this problem includes:    Norvasc 10 Mg Tabs (Amlodipine besylate) .[name]... Take 1 tablet by mouth once a day    Toprol Xl 25 Mg Xr24h-tab (Metoprolol succinate) .[name]... Take 1 tablet by mouth once a day    Benazepril Hcl 40 Mg Tabs (Benazepril hcl) .[name]... Take 1 tablet by mouth once a day    Furosemide 40 Mg Tabs (Furosemide) .[name]... Take 1 tablet by mouth once a day  Orders: T-Basic Metabolic Panel [phone])  BP today: 140/100 Prior BP: 124/86 (11/01/2009)  Prior 10 Yr Risk Heart Disease: 22 % (04/27/2009)  Labs Reviewed: K+:  3.6 (11/01/2009) Creat: : 0.84 (11/01/2009)   Chol: 227 (04/24/2009)   HDL: 79 (04/24/2009)   LDL: 126 (04/24/2009)   TG: 108 (04/24/2009)  Problem # 2:  ATRIAL FIBRILLATION (ICD-427.31) Patient is asymptomatic and rate-controlled on current regimen.  As previously documented, he declined anticoagulation for stroke prophylaxis.  Will continue current regimen.  His updated medication list for this problem includes:    Norvasc 10 Mg Tabs (Amlodipine besylate) .[name]... Take 1 tablet by mouth once a day    Toprol Xl 25 Mg Xr24h-tab (Metoprolol succinate) .[name]... Take 1 tablet by mouth once a day    Aspirin Ec 81 Mg Tbec (Aspirin) .[name]... Take 1 tablet by mouth once a day  Problem # 3:  HYPERCHOLESTEROLEMIA, HX OF (ICD-272.0) Patient has had acceptable LDL without medication. Plan is to check a lipid panel at next visit.  Problem # 4:  HEMOCCULT POSITIVE STOOL (ICD-578.1) I again discussed this with patient and advised referral for a screening colonoscopy.  He declined referral, and said that he could not afford the prep or copay when he was referred in the past.  I explained the risk of colon cancer and the rationale for the colonoscopy.  He will let us know if he reconsiders, in which case will refer again to GI.  Problem # 5:  Hx of URINARY RETENTION (ICD-788.20) Patient will follow-up with his urologist.  He has had no recent problems with urinary retention.  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  Complete Medication List: 1)  Norvasc 10 Mg Tabs (Amlodipine besylate) .... Take 1 tablet by mouth once a day 2)  Colchicine 0.6 Mg Tabs (Colchicine) .... Take 1 tablet by mouth once a day 3)  Toprol Xl 25 Mg Xr24h-tab (Metoprolol succinate) .... Take 1 tablet by mouth once a day 4)  Benazepril Hcl 40 Mg Tabs (Benazepril hcl) .... Take 1 tablet by mouth once a day 5)  Furosemide 40 Mg Tabs (Furosemide) .... Take 1 tablet by mouth once a day 6)  Aspirin Ec 81 Mg Tbec (Aspirin) ....  Take 1 tablet by mouth once a day  Patient Instructions: 1)  Please schedule a follow-up appointment in 2 months. 2)  Please call clinic later today in order to confirm your medication list. 3)  Please bring all medications to each clinic visit.  Prescriptions: TOPROL XL 25 MG XR24H-TAB (METOPROLOL SUCCINATE) Take 1 tablet by mouth once a day  #30 Tablet x 11   Entered and Authorized by:   Margarito Liner MD   Signed by:   Margarito Liner MD on 01/25/2010   Method used:   Electronically to        [facility] [id number] (retail)       [address]  [place], [place]  [address]       Ph: [phone]       Fax: [phone]   RxID:   [id number] NORVASC 10 MG TABS (AMLODIPINE BESYLATE) Take 1 tablet by mouth once a day  #30 Tablet x 11   Entered and Authorized by:   Margarito Liner MD   Signed by:   Margarito Liner MD on 01/25/2010   Method used:   Electronically to        [facility] [id number] (retail)       [address]       [place], [place]  [address]       Ph: [phone]       Fax: [phone]   RxID:   [id number]    Orders Added: 1)  T-Basic Metabolic Panel [phone] 2)  Est. Patient Level III [[address]]    Process Orders Check Orders Results:     [facility]: Check successful Tests Sent for requisitioning (January 25, 2010 10:26 AM):     01/25/2010: [facility] -- T-Basic Metabolic Panel [phone] (signed)     Prevention & Chronic Care Immunizations   Influenza vaccine: Not documented   Influenza vaccine deferral: Refused  (01/25/2010)    Tetanus booster: Not documented   Td booster deferral: Refused  (01/25/2010)    Pneumococcal vaccine: Not documented   Pneumococcal vaccine deferral: Refused  (01/25/2010)    H. zoster vaccine: Not documented   H. zoster vaccine deferral: Refused  (01/25/2010)  Colorectal Screening   Hemoccult: positive  (04/24/2009)    Hemoccult action/deferral: Ordered  (02/23/2009)    Colonoscopy: Not documented   Colonoscopy action/deferral: Refused  (01/25/2010)  Other Screening   PSA: Not documented   PSA action/deferral: Discussed-PSA declined  (10/05/2008)   Smoking status: quit  (01/25/2010)  Lipids   Total Cholesterol: 227  (04/24/2009)   Lipid panel action/deferral: Lipid Panel ordered   LDL: 126  (04/24/2009)   LDL Direct: Not documented   HDL: 79  (04/24/2009)   Triglycerides: 74  (10/06/2008)    SGOT (AST): 22  (11/01/2009)   BMP action: Ordered   SGPT (ALT): 19  (11/01/2009)   Alkaline phosphatase: 87  (11/01/2009)   Total bilirubin: 0.4  (11/01/2009)    Lipid flowsheet reviewed?: Yes   Progress toward LDL goal: At goal  Hypertension   Last Blood Pressure: 140 / 100  (01/25/2010)   Serum creatinine: 0.84  (11/01/2009)   BMP action: Ordered   Serum potassium 3.6  (11/01/2009)    Hypertension flowsheet reviewed?: Yes   Progress toward BP goal: Deteriorated  Self-Management Support :   Personal Goals (by the next clinic visit) :      Personal blood pressure goal: 140/90  (10/05/2008)     Personal LDL goal: 160  (02/23/2009)    Patient will work on the following items until the next clinic visit to reach self-care goals:     Medications and monitoring: take my medicines every day, bring all of my medications to every visit  (01/25/2010)     Eating: drink diet soda or water instead of juice or soda, eat more vegetables, use fresh or frozen vegetables, eat foods that are low in salt, eat baked foods instead of fried foods, eat fruit for snacks and desserts  (01/25/2010)     Activity: take a 30 minute walk every day  (01/25/2010)  Hypertension self-management support: Education handout  (01/25/2010)   Hypertension education handout printed    Lipid self-management support: Education handout  (01/25/2010)     Lipid education handout printed

## 2010-03-31 ENCOUNTER — Encounter: Payer: Self-pay | Admitting: Internal Medicine

## 2010-04-04 ENCOUNTER — Ambulatory Visit (INDEPENDENT_AMBULATORY_CARE_PROVIDER_SITE_OTHER): Payer: MEDICARE | Admitting: Internal Medicine

## 2010-04-04 ENCOUNTER — Encounter: Payer: Self-pay | Admitting: Internal Medicine

## 2010-04-04 VITALS — BP 124/85 | HR 89 | Temp 97.7°F | Ht 73.0 in | Wt 162.8 lb

## 2010-04-04 DIAGNOSIS — E7800 Pure hypercholesterolemia, unspecified: Secondary | ICD-10-CM

## 2010-04-04 DIAGNOSIS — C679 Malignant neoplasm of bladder, unspecified: Secondary | ICD-10-CM

## 2010-04-04 DIAGNOSIS — I4891 Unspecified atrial fibrillation: Secondary | ICD-10-CM

## 2010-04-04 DIAGNOSIS — I10 Essential (primary) hypertension: Secondary | ICD-10-CM

## 2010-04-04 LAB — COMPREHENSIVE METABOLIC PANEL
AST: 28 U/L (ref 0–37)
Albumin: 4.4 g/dL (ref 3.5–5.2)
Alkaline Phosphatase: 90 U/L (ref 39–117)
Calcium: 9.3 mg/dL (ref 8.4–10.5)
Chloride: 100 mEq/L (ref 96–112)
Creat: 0.99 mg/dL (ref 0.40–1.50)
Glucose, Bld: 76 mg/dL (ref 70–99)
Potassium: 3.7 mEq/L (ref 3.5–5.3)
Sodium: 140 mEq/L (ref 135–145)
Total Bilirubin: 0.5 mg/dL (ref 0.3–1.2)

## 2010-04-04 LAB — LIPID PANEL
Cholesterol: 250 mg/dL — ABNORMAL HIGH (ref 0–200)
Triglycerides: 129 mg/dL (ref <150)

## 2010-04-04 NOTE — Progress Notes (Signed)
  Subjective:    Patient ID: Tommy Morris, male    DOB: 1940/05/17, 70 y.o.   MRN: [id number]  HPI Patient returns for follow up of his hypertension and other chronic medical problems. He reports that he has been compliant with his antihypertensive medications. He has no acute complaints today. He did not bring his medications to clinic, but confirmed his list when I read it back to him. He denies any palpitations or other cardiac symptoms.   Review of Systems  Constitutional: Negative for appetite change.  Respiratory: Negative for chest tightness and shortness of breath.   Cardiovascular: Negative for chest pain, palpitations and leg swelling.  Gastrointestinal: Negative for nausea, vomiting and abdominal pain.  Genitourinary: Negative for dysuria and difficulty urinating.       Objective:   Physical Exam  Constitutional: No distress.  Cardiovascular: An irregularly irregular rhythm present. Exam reveals no gallop and no friction rub.   No murmur heard. Pulmonary/Chest: Effort normal and breath sounds normal. No respiratory distress. He has no wheezes. He has no rales.  Musculoskeletal: He exhibits no edema.          Assessment & Plan:

## 2010-04-04 NOTE — Assessment & Plan Note (Addendum)
Lipids:    Component Value Date/Time   CHOL 227 04/24/2009   TRIG 74 10/06/2008 1823   HDL 79 04/24/2009   VLDL 15 1/61/0960 1823   CHOLHDL 2.5 Ratio 10/06/2008 1823    Patient is doing well on diet control alone. Will check a lipid panel.

## 2010-04-04 NOTE — Assessment & Plan Note (Signed)
Patient is asymptomatic with acceptable rate control on current medication regimen. He is taking aspirin for stroke prophylaxis, since he declined anticoagulation.  Plan is to continue current medication regimen.

## 2010-04-04 NOTE — Assessment & Plan Note (Signed)
Lab Results  Component Value Date   NA 142 01/25/2010   K 4.2 01/25/2010   CL 104 01/25/2010   CO2 27 01/25/2010   BUN 16 01/25/2010   CREATININE 1.05 01/25/2010    BP Readings from Last 3 Encounters:  04/04/10 124/85  01/25/10 140/100  11/01/09 124/86    Assessment: Hypertension control:  controlled  Progress toward goals:  at goal Barriers to meeting goals:  no barriers identified  Plan: Hypertension treatment:  continue current medications

## 2010-04-04 NOTE — Assessment & Plan Note (Signed)
Patient has no symptoms of recurrence. I advised him to follow up with his urologist.

## 2010-04-05 ENCOUNTER — Ambulatory Visit: Payer: Self-pay | Admitting: Internal Medicine

## 2010-04-12 LAB — COMPREHENSIVE METABOLIC PANEL
ALT: 15 U/L (ref 0–53)
Alkaline Phosphatase: 55 U/L (ref 39–117)
BUN: 48 mg/dL — ABNORMAL HIGH (ref 6–23)
CO2: 24 mEq/L (ref 19–32)
Calcium: 8.2 mg/dL — ABNORMAL LOW (ref 8.4–10.5)
Chloride: 103 mEq/L (ref 96–112)
GFR calc non Af Amer: 12 mL/min — ABNORMAL LOW (ref >60)
Glucose, Bld: 98 mg/dL (ref 70–99)
Potassium: 2.9 mEq/L — ABNORMAL LOW (ref 3.5–5.1)
Total Protein: 6.8 g/dL (ref 6.0–8.3)

## 2010-04-12 LAB — URINALYSIS, ROUTINE W REFLEX MICROSCOPIC
Bilirubin Urine: NEGATIVE
Glucose, UA: NEGATIVE mg/dL
Ketones, ur: NEGATIVE mg/dL
Ketones, ur: NEGATIVE mg/dL
Leukocytes, UA: NEGATIVE
Nitrite: NEGATIVE
Nitrite: NEGATIVE
Protein, ur: 100 mg/dL — AB
Protein, ur: NEGATIVE mg/dL
Urobilinogen, UA: 0.2 mg/dL (ref 0.0–1.0)
Urobilinogen, UA: 1 mg/dL (ref 0.0–1.0)

## 2010-04-12 LAB — DIFFERENTIAL
Basophils Absolute: 0.1 10*3/uL (ref 0.0–0.1)
Basophils Relative: 2 % — ABNORMAL HIGH (ref 0–1)
Lymphs Abs: 0.5 10*3/uL — ABNORMAL LOW (ref 0.7–4.0)
Monocytes Relative: 7 % (ref 3–12)
Neutrophils Relative %: 83 % — ABNORMAL HIGH (ref 43–77)

## 2010-04-12 LAB — URINE CULTURE
Colony Count: 15000
Culture: NO GROWTH

## 2010-04-12 LAB — BASIC METABOLIC PANEL
BUN: 29 mg/dL — ABNORMAL HIGH (ref 6–23)
BUN: 38 mg/dL — ABNORMAL HIGH (ref 6–23)
CO2: 24 mEq/L (ref 19–32)
Calcium: 8.4 mg/dL (ref 8.4–10.5)
Calcium: 8.8 mg/dL (ref 8.4–10.5)
Chloride: 103 mEq/L (ref 96–112)
Creatinine, Ser: 1.71 mg/dL — ABNORMAL HIGH (ref 0.4–1.5)
Creatinine, Ser: 2.93 mg/dL — ABNORMAL HIGH (ref 0.4–1.5)
GFR calc Af Amer: 26 mL/min — ABNORMAL LOW (ref >60)
GFR calc Af Amer: 48 mL/min — ABNORMAL LOW (ref >60)
GFR calc non Af Amer: 21 mL/min — ABNORMAL LOW (ref >60)

## 2010-04-12 LAB — RAPID URINE DRUG SCREEN, HOSP PERFORMED
Benzodiazepines: NOT DETECTED
Cocaine: NOT DETECTED
Opiates: POSITIVE — AB
Tetrahydrocannabinol: NOT DETECTED

## 2010-04-12 LAB — URINE MICROSCOPIC-ADD ON

## 2010-04-12 LAB — LIPID PANEL
HDL: 79 mg/dL (ref >39)
LDL Cholesterol: 126 mg/dL — ABNORMAL HIGH (ref 0–99)
Total CHOL/HDL Ratio: 2.9 RATIO
VLDL: 22 mg/dL (ref 0–40)

## 2010-04-12 LAB — CBC
HCT: 35 % — ABNORMAL LOW (ref 39.0–52.0)
MCHC: 34.1 g/dL (ref 30.0–36.0)
MCHC: 34.3 g/dL (ref 30.0–36.0)
MCV: 95.1 fL (ref 78.0–100.0)
MCV: 95.3 fL (ref 78.0–100.0)
Platelets: 204 10*3/uL (ref 150–400)
RBC: 3.37 MIL/uL — ABNORMAL LOW (ref 4.22–5.81)
WBC: 4.9 10*3/uL (ref 4.0–10.5)
WBC: 7.4 10*3/uL (ref 4.0–10.5)

## 2010-04-12 LAB — RETICULOCYTES
RBC.: 4.36 MIL/uL (ref 4.22–5.81)
Retic Count, Absolute: 48 10*3/uL (ref 19.0–186.0)
Retic Ct Pct: 1.1 % (ref 0.4–3.1)

## 2010-04-12 LAB — IRON AND TIBC: TIBC: 324 ug/dL (ref 215–435)

## 2010-04-12 LAB — FOLATE: Folate: 10 ng/mL

## 2010-06-09 NOTE — Op Note (Signed)
NAME:  Tommy Morris, DELIS NO.:  [id number]   MEDICAL RECORD NO.:  [id number]          PATIENT TYPE:  AMB   LOCATION:  DAY                          FACILITY:  [facility]   PHYSICIAN:  Glade Nurse, MD     DATE OF BIRTH:  January 16, 1941   DATE OF PROCEDURE:  10/26/2004  DATE OF DISCHARGE:                                 OPERATIVE REPORT   PREOPERATIVE DIAGNOSIS:  1.  Elevated PSA.  2.  Bladder mass.   POSTOPERATIVE DIAGNOSIS:  1.  Elevated PSA.  2.  Bladder mass.   PROCEDURE:  1.  Transrectal ultrasound-guided biopsy of prostate.  2.  Cystourethroscopy.  3.  TURBT, 2.0 to 5.0 cm.  4.  Placement of right double-J stent.   SURGEON:  Davian Hanshaw. Annabell Howells, M.D.   ASSISTANT:  Glade Nurse, M.D.   ANESTHESIA:  General endotracheal.   SPECIMENS:  1.  Prostate.  2.  Bladder mass.   PROCEDURE:  The patient was identified by his wrist bracelet and brought to  Room 6 where he received preoperative antibiotics and was administered  general anesthesia. He was prepped and draped in usual sterile fashion.  Next, we gently inserted the transrectal-ultrasound probe into the rectum  and visualized the prostate. We systematically viewed the entire prostate  from seminal vesicles through apex, both the transverse and sagittal planes.  Measurements were taken which demonstrated a volume of 47.4 cc. The  patient's preoperative PSA was 5.86 giving him a PSA density 0.12. Next, we  took 12 core biopsy taking two cores at the base on each side, one lateral  one medially. We reported this process at the mid and apical portions of  gland. This was repeated on the contralateral side. Next, the probe was  removed. Pressure was held on his prostate. No hematoma was appreciated.  Following this, the patient was reprepped and draped. We inserted a 22-  French rigid cystoscope into the anterior urethra. His anterior and  posterior urethra were without mucosal abnormality. Of note, his prostatic  urethra was quite long with a high bladder neck without any lateral or  median lobe hypertrophy. On entering the bladder, we immediately noticed a  large papillary mass on the right side of the trigone, just medial lateral  to the ureteral orifice. Both ureteral orifices were seen to be effluxing  clear bilaterally. The remainder of the mucosa was mildly trabeculated but  was without other mucosal abnormalities or foreign body. Next, we removed  the cystoscope and inserted a 26-French resectoscope sheath with a  obturator. The obturator was removed. Resectoscope was placed. We then  resected the mass from a lateral orientation medially, keeping the ureteral  orifice under constant vision and trying to maintain a superficial resection  plane while still resecting the urothelium and obtaining muscle in the  specimen. The entire mass was resected and passed off the field for  pathologic analysis. We then used the coagulation setting to obtain  hemostasis on the periphery of the resection site.  Next, the patient was given an amp of indigo carmine, and a clear  efflux of  blue urine was seen from the left ureteral orifice. From the right ureteral  orifice, we noted that the intramural ureter had been involved in the  resection. It was the anterior wall of the urethra over approximately 1/2 cm  area was resected leaving a window in the ureter. On further infection, we  were not able to identify the ureteral orifice. We then removed the  resectoscope and placed a 22-French cystoscope, and with the aid of an  _____________ bridge, we were able to pass a 0.038 guidewire through the  defect in the intramural ureter in retrograde fashion. Over this, we placed  a 5-French end-hole catheter and the wire proximally to the mid ureteral  position, and the wire was removed. We then aspirated and obtained blue  urine confirming our presence in the collecting system. We then placed the  0.039 guidewire back  through the end-hole catheter, back loaded off the end-  hole catheter, and placed a 6 x 26 double-J stent, obtaining excellent  distal curl. Following this, the hemostasis of the bladder was reconfirmed  and the cystoscope was removed, and we were able to easily place a 20-French  Foley catheter with clear blue urine being obtained. Foley catheter was  attached to drainage bag, and the patient was reversed from his anesthesia  which he tolerated without complication. Please note Dr. Annabell Howells was present  and participated in all aspects of this case.           ______________________________  Glade Nurse, MD     MT/MEDQ  D:  10/26/2004  T:  10/26/2004  Job:  [id number]

## 2010-10-24 ENCOUNTER — Other Ambulatory Visit: Payer: Self-pay | Admitting: Internal Medicine

## 2011-01-31 ENCOUNTER — Ambulatory Visit (INDEPENDENT_AMBULATORY_CARE_PROVIDER_SITE_OTHER): Payer: Medicare Other | Admitting: Internal Medicine

## 2011-01-31 ENCOUNTER — Encounter: Payer: Self-pay | Admitting: Internal Medicine

## 2011-01-31 DIAGNOSIS — D649 Anemia, unspecified: Secondary | ICD-10-CM

## 2011-01-31 DIAGNOSIS — I4891 Unspecified atrial fibrillation: Secondary | ICD-10-CM

## 2011-01-31 DIAGNOSIS — J984 Other disorders of lung: Secondary | ICD-10-CM

## 2011-01-31 DIAGNOSIS — I10 Essential (primary) hypertension: Secondary | ICD-10-CM

## 2011-01-31 DIAGNOSIS — C679 Malignant neoplasm of bladder, unspecified: Secondary | ICD-10-CM

## 2011-01-31 DIAGNOSIS — R911 Solitary pulmonary nodule: Secondary | ICD-10-CM

## 2011-01-31 LAB — CBC WITH DIFFERENTIAL/PLATELET
Basophils Relative: 1 % (ref 0–1)
HCT: 45.9 % (ref 39.0–52.0)
Hemoglobin: 15.9 g/dL (ref 13.0–17.0)
Lymphocytes Relative: 26 % (ref 12–46)
Lymphs Abs: 1.7 10*3/uL (ref 0.7–4.0)
Monocytes Absolute: 0.7 10*3/uL (ref 0.1–1.0)
Monocytes Relative: 11 % (ref 3–12)
Neutro Abs: 3.9 10*3/uL (ref 1.7–7.7)
Neutrophils Relative %: 59 % (ref 43–77)
RBC: 4.91 MIL/uL (ref 4.22–5.81)
WBC: 6.6 10*3/uL (ref 4.0–10.5)

## 2011-01-31 LAB — LIPID PANEL: Total CHOL/HDL Ratio: 2.3 Ratio

## 2011-01-31 MED ORDER — FUROSEMIDE 40 MG PO TABS: 40.0000 mg | ORAL_TABLET | Freq: Every day | ORAL | Status: DC

## 2011-01-31 MED ORDER — BENAZEPRIL HCL 40 MG PO TABS: 40.0000 mg | ORAL_TABLET | Freq: Every day | ORAL | Status: DC

## 2011-01-31 MED ORDER — AMLODIPINE BESYLATE 5 MG PO TABS: 5.0000 mg | ORAL_TABLET | Freq: Every day | ORAL | Status: DC

## 2011-01-31 MED ORDER — ASPIRIN 81 MG PO TBEC: 81.0000 mg | DELAYED_RELEASE_TABLET | Freq: Every day | ORAL | Status: DC

## 2011-01-31 MED ORDER — METOPROLOL SUCCINATE ER 25 MG PO TB24: 12.5000 mg | ORAL_TABLET | Freq: Every day | ORAL | Status: DC

## 2011-01-31 NOTE — Assessment & Plan Note (Signed)
Patient is asymptomatic; his heart rate is in the upper end of normal range since he has been out of his metoprolol.  His blood pressure is normal.  The plan is to restart Toprol-XL at a dose of 12.5 mg daily; will also decrease amlodipine 5 mg daily to avoid lowering his blood pressure with the addition of metoprolol.  I again discussed the advisability of anticoagulation for stroke prophylaxis, but patient declined this; he is willing to take a daily aspirin as before.

## 2011-01-31 NOTE — Assessment & Plan Note (Signed)
Patient had 2 small right lung nodules on a chest CT scan on 04/25/2009, with a followup CT scan recommended in one year.  He did not follow up so this has not been done.  I discussed this with him today, and the plan is to obtain a chest CT scan to evaluate the right lung nodules.

## 2011-01-31 NOTE — Progress Notes (Signed)
  Subjective:    Patient ID: Tommy Morris, male    DOB: 03-22-1940, 71 y.o.   MRN: [id number]  HPI Patient returns for followup of his hypertension, chronic atrial fibrillation, and other chronic medical problems.  He has no acute complaints today.  He reports that he ran out of his metoprolol about one month ago, but otherwise has been compliant with his medications. He reports that his wife died of a stroke about one month ago; he says that he has been dealing reasonably well with the loss.  Review of Systems  Constitutional: Negative for fever, chills, diaphoresis and appetite change.  Eyes: Negative for visual disturbance.  Respiratory: Negative for shortness of breath.   Cardiovascular: Negative for chest pain, palpitations and leg swelling.  Gastrointestinal: Negative for nausea, vomiting, abdominal pain and blood in stool.  Genitourinary: Negative for dysuria and difficulty urinating.  Musculoskeletal: Negative for myalgias and arthralgias.  Neurological: Negative for dizziness, weakness and numbness.       Objective:   Physical Exam  Constitutional: No distress.  Cardiovascular: S1 normal and S2 normal.  An irregularly irregular rhythm present. Exam reveals no S3.   No murmur heard.      Apical heart rate is 96.  Pulmonary/Chest: Effort normal and breath sounds normal. He has no wheezes. He has no rales.  Abdominal: Bowel sounds are normal. There is no tenderness.  Musculoskeletal: He exhibits no edema.       Assessment & Plan:

## 2011-01-31 NOTE — Assessment & Plan Note (Signed)
Lab Results  Component Value Date   NA 140 04/04/2010   K 3.7 04/04/2010   CL 100 04/04/2010   CO2 26 04/04/2010   BUN 17 04/04/2010   CREATININE 0.99 04/04/2010   CREATININE 1.05 01/25/2010    BP Readings from Last 3 Encounters:  01/31/11 112/76  04/04/10 124/85  01/25/10 140/100    Assessment: Hypertension control:  controlled  Progress toward goals:  at goal Barriers to meeting goals:  no barriers identified  Plan: Hypertension treatment:  As noted under the problem of atrial fibrillation, the plan is to have patient restart Toprol-XL at 12.5 mg daily for rate control, and also decrease amlodipine to a dose of 5 mg daily to avoid dropping his blood pressure with the resumption of metoprolol. Will continue furosemide and benazepril at current dose.

## 2011-01-31 NOTE — Patient Instructions (Addendum)
1.  Stop amlodipine 10 mg daily. 2.  Start amlodipine 5 mg one tablet daily. 3.  Change metoprolol XL (Toprol XL) 25 mg to a dose of one-half tablet daily. 4.  Please keep appointment for CT scan of chest to followup small right lung nodules appear

## 2011-01-31 NOTE — Assessment & Plan Note (Signed)
Lab Results  Component Value Date   HGB 14.8 11/01/2009   HGB 10.9* 04/25/2009   HGB 10.9 04/25/2009    Plan is to check a CBC today.

## 2011-01-31 NOTE — Assessment & Plan Note (Signed)
I advised patient to followup with his urologist Dr. Annabell Howells, and he agreed to do so to

## 2011-02-01 LAB — COMPLETE METABOLIC PANEL WITH GFR
Albumin: 4.1 g/dL (ref 3.5–5.2)
Alkaline Phosphatase: 109 U/L (ref 39–117)
BUN: 15 mg/dL (ref 6–23)
CO2: 27 mEq/L (ref 19–32)
Calcium: 9.5 mg/dL (ref 8.4–10.5)
Chloride: 99 mEq/L (ref 96–112)
GFR, Est Non African American: 77 mL/min
Glucose, Bld: 67 mg/dL — ABNORMAL LOW (ref 70–99)
Potassium: 3.5 mEq/L (ref 3.5–5.3)
Sodium: 142 mEq/L (ref 135–145)
Total Protein: 7.5 g/dL (ref 6.0–8.3)

## 2011-02-21 ENCOUNTER — Other Ambulatory Visit (HOSPITAL_COMMUNITY): Payer: Medicare Other

## 2011-04-04 ENCOUNTER — Encounter: Payer: Self-pay | Admitting: Internal Medicine

## 2011-04-04 ENCOUNTER — Ambulatory Visit (INDEPENDENT_AMBULATORY_CARE_PROVIDER_SITE_OTHER): Payer: Medicare Other | Admitting: Internal Medicine

## 2011-04-04 VITALS — BP 147/90 | HR 74 | Temp 97.7°F | Wt 165.1 lb

## 2011-04-04 DIAGNOSIS — Z1211 Encounter for screening for malignant neoplasm of colon: Secondary | ICD-10-CM

## 2011-04-04 DIAGNOSIS — F1011 Alcohol abuse, in remission: Secondary | ICD-10-CM

## 2011-04-04 DIAGNOSIS — E7800 Pure hypercholesterolemia, unspecified: Secondary | ICD-10-CM

## 2011-04-04 DIAGNOSIS — C679 Malignant neoplasm of bladder, unspecified: Secondary | ICD-10-CM

## 2011-04-04 DIAGNOSIS — J984 Other disorders of lung: Secondary | ICD-10-CM

## 2011-04-04 DIAGNOSIS — I10 Essential (primary) hypertension: Secondary | ICD-10-CM

## 2011-04-04 DIAGNOSIS — I4891 Unspecified atrial fibrillation: Secondary | ICD-10-CM

## 2011-04-04 MED ORDER — METOPROLOL SUCCINATE ER 25 MG PO TB24: 25.0000 mg | ORAL_TABLET | Freq: Every day | ORAL | Status: DC

## 2011-04-04 NOTE — Patient Instructions (Addendum)
Increase metoprolol XL (Toprol-XL) 25 mg daily dose of one tablet daily. Please complete and return the Hemoccult cards as instructed. Please keep your appointment with Dr. Annabell Howells for followup of your bladder cancer. Please call and reschedule your appointment for a screening colonoscopy. Please call and reschedule your appointment for a chest CT scan to followup two small lung nodules.

## 2011-04-04 NOTE — Assessment & Plan Note (Signed)
Assessment: Patient reports consumption at upper limits of moderate range.  Plan: I advised that patient limit alcohol consumption.

## 2011-04-04 NOTE — Assessment & Plan Note (Signed)
Assessment: Patient did not followup for the scheduled chest CT scan to monitor 2 small right lung nodules noted in April of 2001.   Plan: I advised him to reschedule the scan and keep the appointment.

## 2011-04-04 NOTE — Assessment & Plan Note (Signed)
Assessment: Patient has acceptable rate control and no new symptoms on current regimen.  He previously declined warfarin anticoagulation following a discussion of the potential benefits and risks.  Plan: Continue aspirin 81 mg daily and metoprolol for rate control; the metoprolol dose was increased to 25 mg daily today in order to more effectively treat his hypertension.

## 2011-04-04 NOTE — Assessment & Plan Note (Signed)
Assessment: Patient reports that he has an appointment with Dr. Annabell Howells in April.  Plan: I advised patient to keep his scheduled appointment with Dr. Annabell Howells.

## 2011-04-04 NOTE — Assessment & Plan Note (Signed)
Lab Results  Component Value Date   NA 142 01/31/2011   K 3.5 01/31/2011   CL 99 01/31/2011   CO2 27 01/31/2011   BUN 15 01/31/2011   CREATININE 0.98 01/31/2011    BP Readings from Last 3 Encounters:  04/04/11 147/90  01/31/11 112/76  04/04/10 124/85    Assessment: Hypertension control:  mildly elevated  Progress toward goals:  deteriorated Barriers to meeting goals:  no barriers identified  Plan: Hypertension treatment:  Increase metoprolol XL to a dose of 25 mg daily; continue amlodipine at current dose of 5 mg daily; continue benazepril 40 mg daily; continue furosemide 40 mg daily.

## 2011-04-04 NOTE — Progress Notes (Signed)
  Subjective:    Patient ID: Tommy Morris, male    DOB: [birth year], 71 y.o.   MRN: [id number]  HPI Patient returns for followup of his hypertension, hypercholesterolemia, chronic atrial fibrillation, and other chronic medical problems.  He has no acute complaints today, and reports that he has been doing well.  He reports that he has been compliant with his medications, but he has not yet taken his dose of metoprolol today.  Although I advised at the time of patient's last visit that he follow up with gastroenterology for a colonoscopy, and also advised and ordered a chest CT scan  to follow up a pulmonary nodule, he did not have these procedures done; he reports that the main issue is having to pay a co-pay.   Review of Systems  Constitutional: Negative for fever, chills and diaphoresis.  Respiratory: Negative for shortness of breath.   Cardiovascular: Negative for chest pain and leg swelling.  Gastrointestinal: Negative for nausea, vomiting and abdominal pain.       Objective:   Physical Exam  Constitutional: No distress.  Cardiovascular: An irregularly irregular rhythm present. Exam reveals no S3 and no S4.   No murmur heard.      No lower extremity edema.  Pulmonary/Chest: Effort normal and breath sounds normal. He has no wheezes. He has no rales.  Abdominal: Soft. Bowel sounds are normal. There is no tenderness. There is no rebound and no guarding.       Assessment & Plan:

## 2011-04-04 NOTE — Assessment & Plan Note (Addendum)
Lipids:    Component Value Date/Time   CHOL 231* 01/31/2011 1105   TRIG 77 01/31/2011 1105   HDL 100 01/31/2011 1105   LDLCALC 116* 01/31/2011 1105   VLDL 15 01/31/2011 1105   CHOLHDL 2.3 01/31/2011 1105    Assessment: Patient's LDL is at goal on dietary management alone.  Plan: Continue dietary management.

## 2011-08-15 IMAGING — CR DG KNEE COMPLETE 4+V*R*
4 series · 4 of 4 positions shown · non-contrast
Comparison: None

CLINICAL DATA: Right knee pain.

RIGHT KNEE - COMPLETE 4+ VIEW

[t knee ap right *]
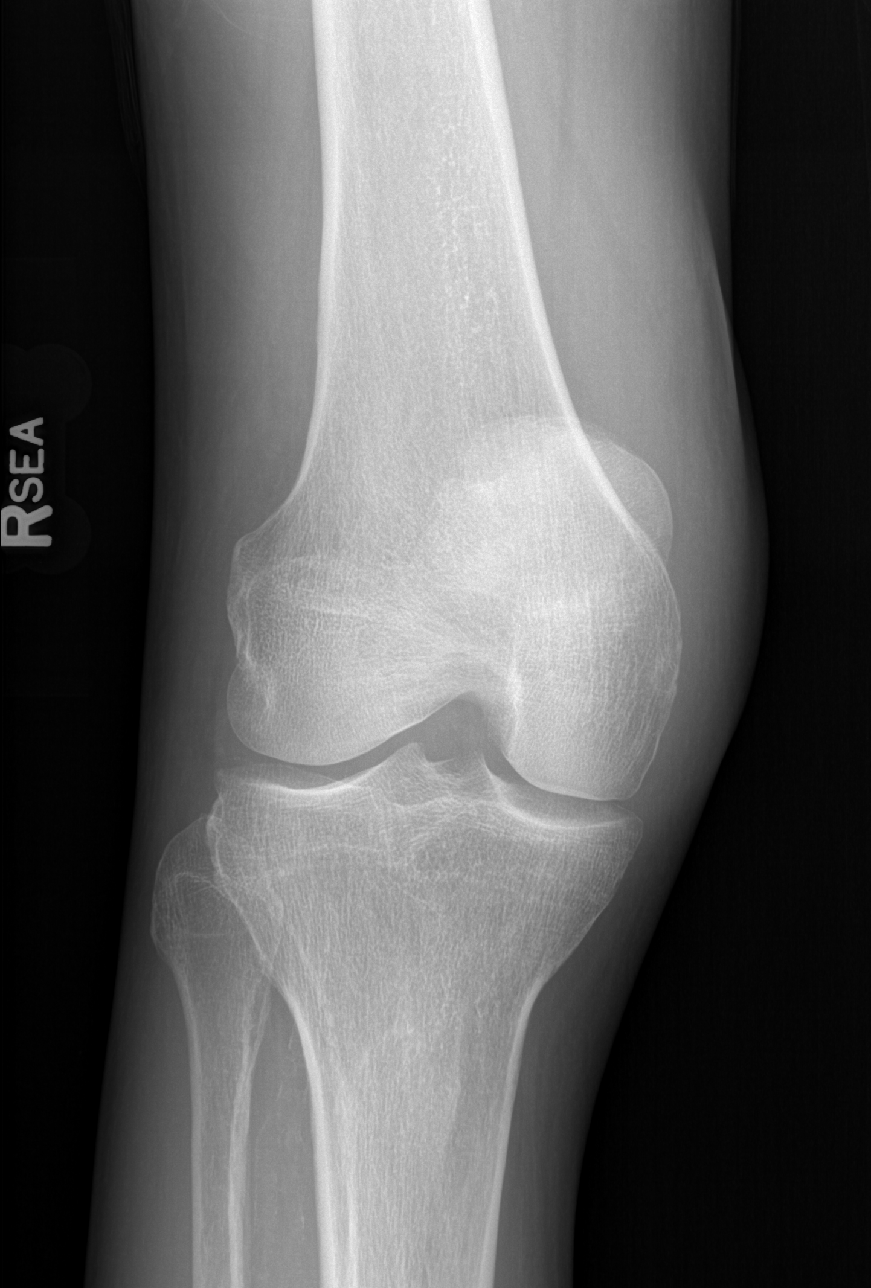

[t knee oblique right (1 of 2)]
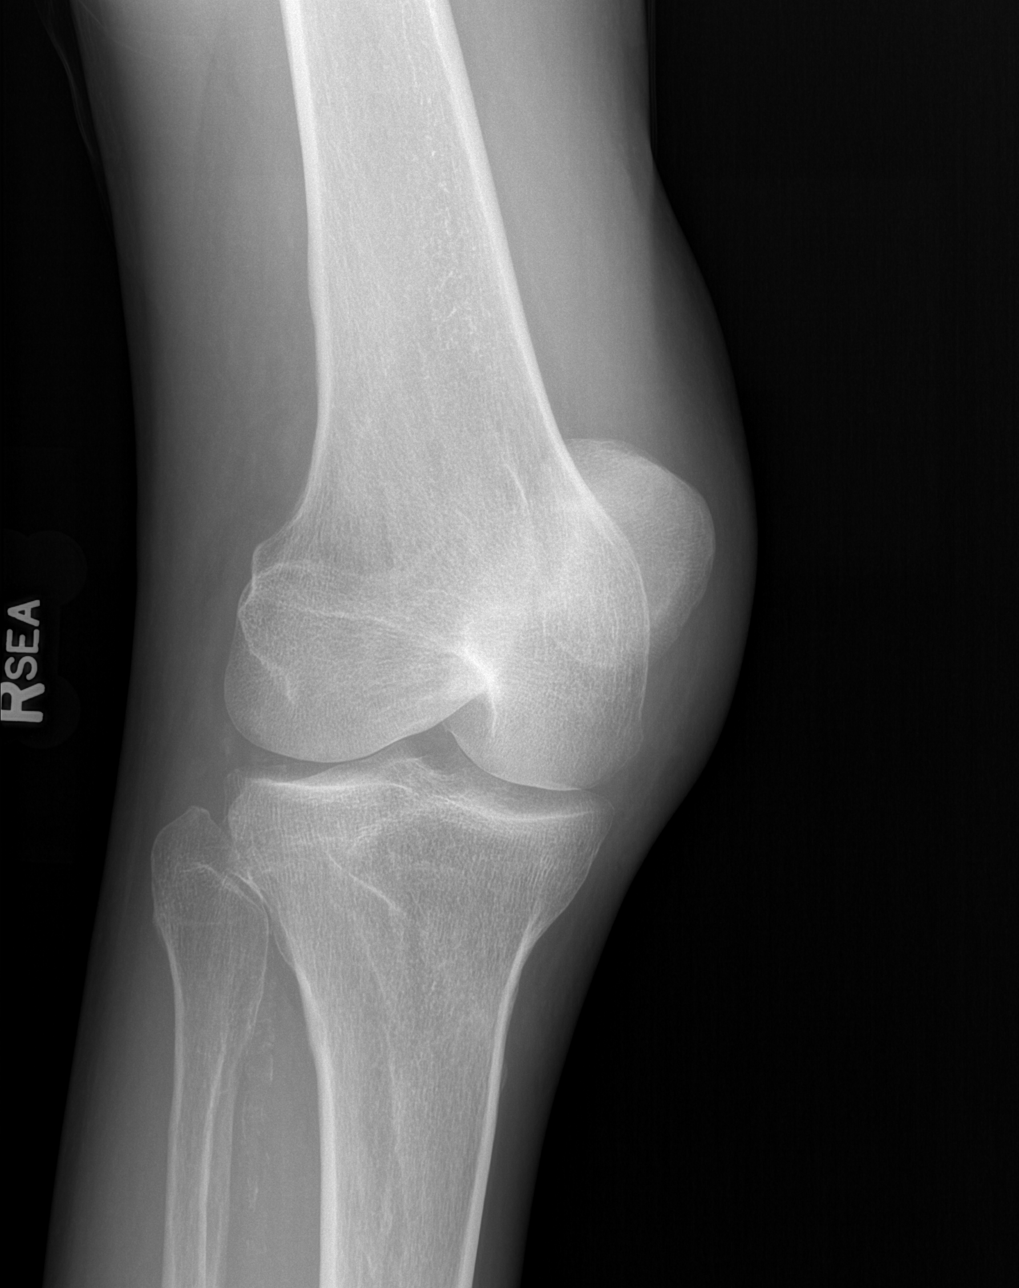

[t knee oblique right (2 of 2)]
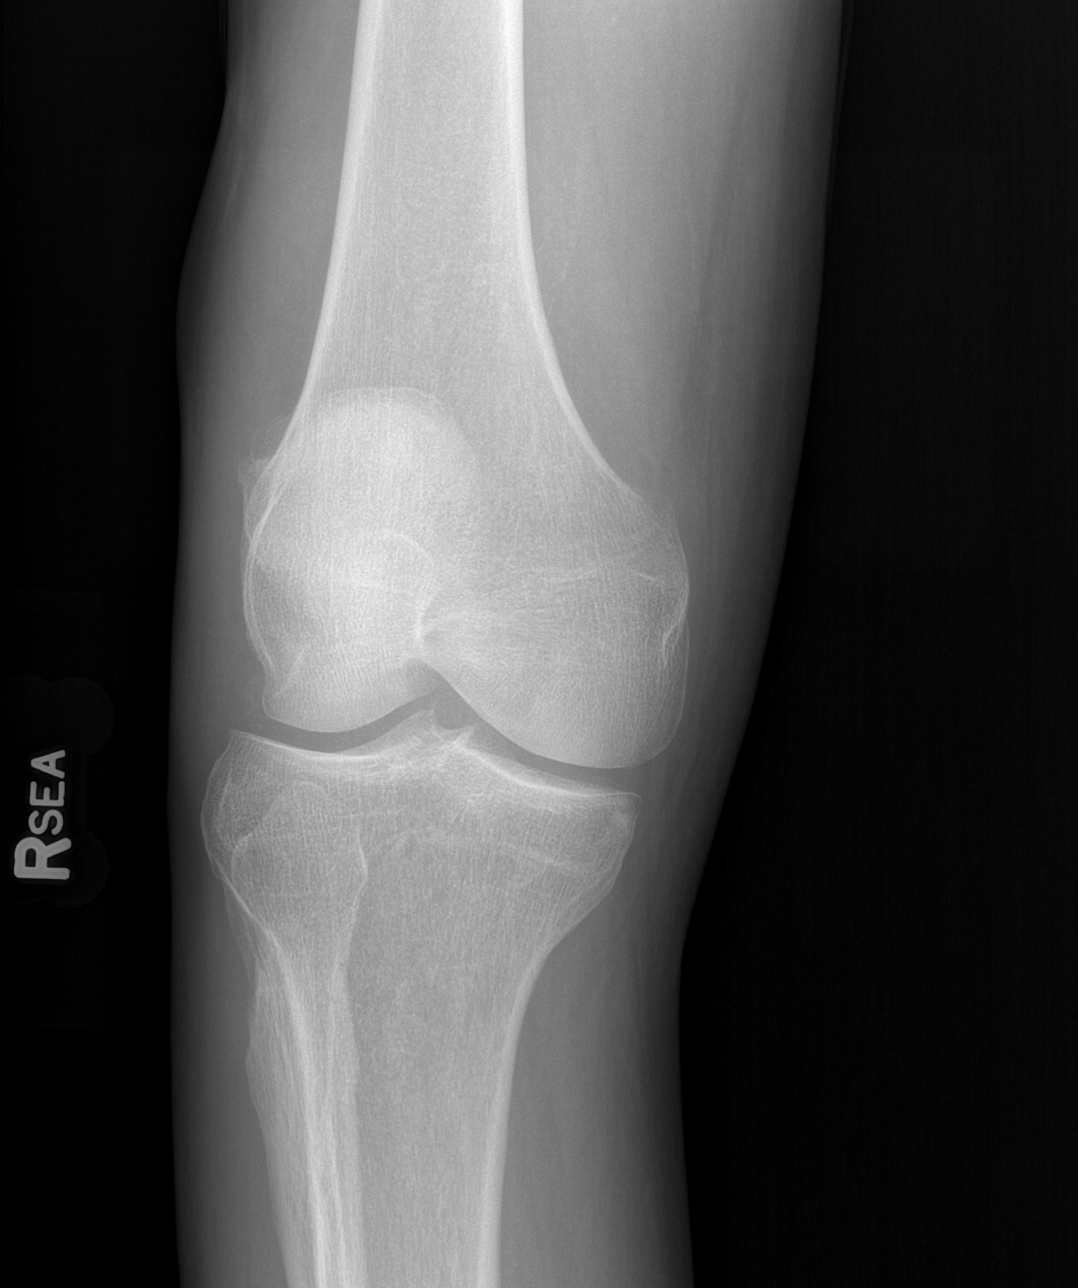

[t knee lat right]
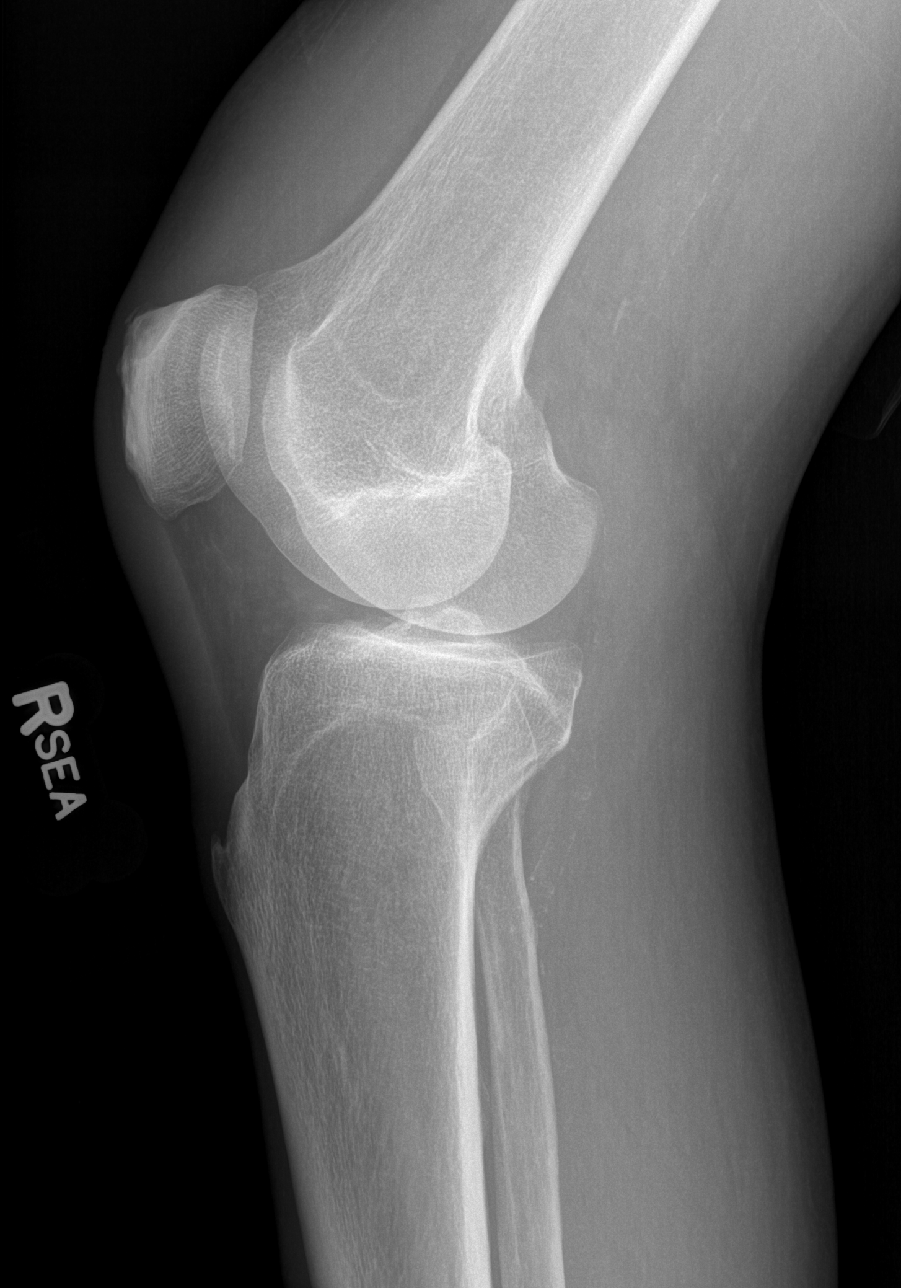

[4 of 4 positions shown; findings below may reference images not displayed]

FINDINGS: The joint spaces are fairly well maintained.  There are
mild degenerative changes and chondrocalcinosis.  Vascular
calcifications are noted.  A joint effusion is suspected.
IMPRESSION: Mild degenerative changes but no acute bony findings.  Probable
joint effusion.

## 2011-08-31 ENCOUNTER — Other Ambulatory Visit: Payer: Self-pay | Admitting: *Deleted

## 2011-08-31 NOTE — Telephone Encounter (Signed)
Last labs 01/31/11

## 2011-09-03 MED ORDER — FUROSEMIDE 40 MG PO TABS: 40.0000 mg | ORAL_TABLET | Freq: Every day | ORAL | Status: DC

## 2011-09-03 MED ORDER — AMLODIPINE BESYLATE 5 MG PO TABS: 5.0000 mg | ORAL_TABLET | Freq: Every day | ORAL | Status: DC

## 2011-09-03 MED ORDER — BENAZEPRIL HCL 40 MG PO TABS: 40.0000 mg | ORAL_TABLET | Freq: Every day | ORAL | Status: DC

## 2011-09-28 ENCOUNTER — Other Ambulatory Visit: Payer: Self-pay | Admitting: Internal Medicine

## 2011-12-12 ENCOUNTER — Ambulatory Visit (INDEPENDENT_AMBULATORY_CARE_PROVIDER_SITE_OTHER): Payer: Medicare Other | Admitting: Internal Medicine

## 2011-12-12 ENCOUNTER — Encounter: Payer: Self-pay | Admitting: Internal Medicine

## 2011-12-12 VITALS — BP 127/85 | HR 72 | Temp 98.0°F | Ht 73.0 in | Wt 148.2 lb

## 2011-12-12 DIAGNOSIS — J984 Other disorders of lung: Secondary | ICD-10-CM

## 2011-12-12 DIAGNOSIS — R634 Abnormal weight loss: Secondary | ICD-10-CM | POA: Insufficient documentation

## 2011-12-12 DIAGNOSIS — I4891 Unspecified atrial fibrillation: Secondary | ICD-10-CM

## 2011-12-12 DIAGNOSIS — C679 Malignant neoplasm of bladder, unspecified: Secondary | ICD-10-CM

## 2011-12-12 DIAGNOSIS — R911 Solitary pulmonary nodule: Secondary | ICD-10-CM

## 2011-12-12 DIAGNOSIS — I10 Essential (primary) hypertension: Secondary | ICD-10-CM

## 2011-12-12 LAB — CBC WITH DIFFERENTIAL/PLATELET
HCT: 42.1 % (ref 39.0–52.0)
Hemoglobin: 14.8 g/dL (ref 13.0–17.0)
Lymphocytes Relative: 34 % (ref 12–46)
Monocytes Absolute: 0.5 10*3/uL (ref 0.1–1.0)
Monocytes Relative: 11 % (ref 3–12)
Neutro Abs: 1.9 10*3/uL (ref 1.7–7.7)
Neutrophils Relative %: 48 % (ref 43–77)
RBC: 4.55 MIL/uL (ref 4.22–5.81)
WBC: 4 10*3/uL (ref 4.0–10.5)

## 2011-12-12 LAB — COMPLETE METABOLIC PANEL WITH GFR
Alkaline Phosphatase: 97 U/L (ref 39–117)
BUN: 15 mg/dL (ref 6–23)
Creat: 0.9 mg/dL (ref 0.50–1.35)
GFR, Est African American: >89 mL/min
GFR, Est Non African American: 84 mL/min
Glucose, Bld: 68 mg/dL — ABNORMAL LOW (ref 70–99)
Sodium: 145 mEq/L (ref 135–145)
Total Bilirubin: 0.5 mg/dL (ref 0.3–1.2)
Total Protein: 6.7 g/dL (ref 6.0–8.3)

## 2011-12-12 LAB — TSH: TSH: 0.92 u[IU]/mL (ref 0.350–4.500)

## 2011-12-12 NOTE — Patient Instructions (Addendum)
1.  Continue current medications. 2.  Please keep appointment for CT scan of the chest to follow up two small lung nodules.  3.  Please eat regular meals and snacks. 4.  Please complete and return Hemoccult cards as instructed.

## 2011-12-12 NOTE — Assessment & Plan Note (Signed)
Assessment: Patient still did not followup for the recommended chest CT scan to monitor 2 small right lung nodules noted in April of 2011. I discussed again the importance of having a follow-up chest CT scan, and he agreed to have the scan done.  Plan: I again ordered a chest CT scan to follow up the two small right lung nodules.

## 2011-12-12 NOTE — Assessment & Plan Note (Signed)
Lab Results  Component Value Date   NA 142 01/31/2011   K 3.5 01/31/2011   CL 99 01/31/2011   CO2 27 01/31/2011   BUN 15 01/31/2011   CREATININE 0.98 01/31/2011    BP Readings from Last 3 Encounters:  12/12/11 127/85  04/04/11 147/90  01/31/11 112/76    Assessment: Hypertension control:  controlled  Progress toward goals:  at goal Barriers to meeting goals:  no barriers identified  Plan: Hypertension treatment:  continue current medications (amlodipine 5 mg daily, benazepril 40 mg daily, and furosemide 40 mg daily)

## 2011-12-12 NOTE — Progress Notes (Signed)
  Subjective:    Patient ID: Tommy Morris, male    DOB: 07/31/1940, 71 y.o.   MRN: [id number]  HPI Patient returns for followup of his hypertension, chronic atrial fibrillation, and other chronic medical problems.  He has no acute complaints today, and reports that he has been doing well.  He does report an approximate 15 pound weight loss which he attributes to eating less.  He did not bring his medications to clinic, but confirmed his list from memory and also stated that he had not taken Toprol-XL for approximately 6 months and feels that he has done well without that medication.   Review of Systems  Constitutional: Positive for unexpected weight change. Negative for fever, chills, diaphoresis and appetite change.  Respiratory: Negative for cough and shortness of breath.   Cardiovascular: Negative for chest pain, palpitations and leg swelling.  Gastrointestinal: Negative for nausea, vomiting, abdominal pain and blood in stool.  Genitourinary: Negative for difficulty urinating.  Neurological: Negative for speech difficulty, weakness and numbness.  Psychiatric/Behavioral: Negative for dysphoric mood.       Objective:   Physical Exam  Constitutional: No distress.  Cardiovascular: An irregularly irregular rhythm present. Exam reveals no S3.   Murmur heard.  Crescendo decrescendo systolic murmur is present with a grade of 2/6       No edema  Pulmonary/Chest: Effort normal and breath sounds normal. He has no wheezes. He has no rales.  Abdominal: Soft. Bowel sounds are normal. He exhibits no distension. There is no hepatosplenomegaly. There is no tenderness. There is no guarding.       Assessment & Plan:

## 2011-12-12 NOTE — Assessment & Plan Note (Signed)
Assessment: Patient has followed up with his urologist and reestablished care.  Plan: I encouraged patient to follow up as planned.

## 2011-12-12 NOTE — Assessment & Plan Note (Signed)
Wt Readings from Last 3 Encounters:  12/12/11 148 lb 3.2 oz (67.223 kg)  04/04/11 165 lb 1.6 oz (74.889 kg)  01/31/11 163 lb 9.6 oz (74.208 kg)    Assessment: Patient presents with an approximate 17 pound weight loss over the past 8 months.  He attributes this to eating less, although he does not seem to have loss of appetite.  He denies symptoms of depression.  I discussed at length with him the importance of working this up, including a chest CT scan to follow up the previously noted pulmonary nodules (patient did not follow up for the CT scan I previously ordered), and also including a colonoscopy to evaluate his history of heme positive stool and now his weight loss.  Patient agreed to have a CT scan of the chest, but he still refuses colonoscopy.  Plan: CT scan of the chest to follow up the previously noted pulmonary nodules; labs to include a comprehensive metabolic panel, CBC with differential, and TSH.  I advised patient to eat threeregular meals a day and snacks, and to supplement his diet with Ensure or a similar supplement.

## 2011-12-12 NOTE — Assessment & Plan Note (Signed)
Assessment: Patient has chronic atrial fibrillation.  Today he stated that he has not taken his metoprolol in over 6 months and that he is doing fine without the medication.  He previously declined anticoagulation for his atrial fibrillation, and chose aspirin alone as stroke prophylaxis.  Today in clinic his rate is reasonably well controlled both at rest and with ambulation.  Plan: Continue aspirin 81 mg daily.  I advised patient to let me know if he has any tachycardia, palpitations, or other symptoms related to his atrial fibrillation.

## 2012-01-03 ENCOUNTER — Other Ambulatory Visit: Payer: Self-pay | Admitting: Internal Medicine

## 2012-01-03 MED ORDER — POTASSIUM CHLORIDE ER 10 MEQ PO TBCR: 10.0000 meq | EXTENDED_RELEASE_TABLET | Freq: Every day | ORAL | Status: DC

## 2012-01-03 NOTE — Progress Notes (Signed)
Patient's potassium is low at 3.3, and he is on a diuretic. The plan is to start potassium chloride 10 mEq daily. I tried to reach patient by phone, but his number Is no longer in use. The plan is to send a prescription to his pharmacy with a note to ask him to contact the clinic regarding the prescription. He has a follow-up appointment with me in January.

## 2012-01-07 ENCOUNTER — Ambulatory Visit (HOSPITAL_COMMUNITY): Payer: Medicare Other

## 2012-01-08 ENCOUNTER — Encounter: Payer: Self-pay | Admitting: *Deleted

## 2012-02-06 ENCOUNTER — Ambulatory Visit: Payer: Medicare Other | Admitting: Internal Medicine

## 2012-04-03 ENCOUNTER — Other Ambulatory Visit: Payer: Self-pay | Admitting: *Deleted

## 2012-04-04 MED ORDER — AMLODIPINE BESYLATE 5 MG PO TABS: 5.0000 mg | ORAL_TABLET | Freq: Every day | ORAL | Status: DC

## 2012-04-30 ENCOUNTER — Ambulatory Visit (INDEPENDENT_AMBULATORY_CARE_PROVIDER_SITE_OTHER): Payer: Medicare Other | Admitting: Internal Medicine

## 2012-04-30 ENCOUNTER — Encounter: Payer: Self-pay | Admitting: Internal Medicine

## 2012-04-30 VITALS — BP 126/72 | HR 118 | Temp 96.8°F | Wt 159.3 lb

## 2012-04-30 DIAGNOSIS — I10 Essential (primary) hypertension: Secondary | ICD-10-CM

## 2012-04-30 DIAGNOSIS — I4891 Unspecified atrial fibrillation: Secondary | ICD-10-CM

## 2012-04-30 DIAGNOSIS — R634 Abnormal weight loss: Secondary | ICD-10-CM

## 2012-04-30 DIAGNOSIS — J984 Other disorders of lung: Secondary | ICD-10-CM

## 2012-04-30 LAB — COMPLETE METABOLIC PANEL WITH GFR
ALT: 19 U/L (ref 0–53)
Albumin: 3.8 g/dL (ref 3.5–5.2)
Alkaline Phosphatase: 80 U/L (ref 39–117)
CO2: 28 mEq/L (ref 19–32)
GFR, Est African American: >89 mL/min
GFR, Est Non African American: 80 mL/min
Glucose, Bld: 69 mg/dL — ABNORMAL LOW (ref 70–99)
Potassium: 3.9 mEq/L (ref 3.5–5.3)
Sodium: 141 mEq/L (ref 135–145)
Total Bilirubin: 0.5 mg/dL (ref 0.3–1.2)
Total Protein: 6.6 g/dL (ref 6.0–8.3)

## 2012-04-30 LAB — CBC WITH DIFFERENTIAL/PLATELET
Basophils Absolute: 0 10*3/uL (ref 0.0–0.1)
Basophils Relative: 1 % (ref 0–1)
Eosinophils Absolute: 0.1 10*3/uL (ref 0.0–0.7)
HCT: 41.6 % (ref 39.0–52.0)
Hemoglobin: 14.9 g/dL (ref 13.0–17.0)
MCH: 33 pg (ref 26.0–34.0)
MCHC: 35.8 g/dL (ref 30.0–36.0)
Monocytes Absolute: 0.6 10*3/uL (ref 0.1–1.0)
Monocytes Relative: 14 % — ABNORMAL HIGH (ref 3–12)
Neutro Abs: 2.5 10*3/uL (ref 1.7–7.7)
RDW: 14.8 % (ref 11.5–15.5)

## 2012-04-30 MED ORDER — BENAZEPRIL HCL 40 MG PO TABS: 40.0000 mg | ORAL_TABLET | Freq: Every day | ORAL | Status: DC

## 2012-04-30 MED ORDER — AMLODIPINE BESYLATE 5 MG PO TABS: 5.0000 mg | ORAL_TABLET | Freq: Every day | ORAL | Status: DC

## 2012-04-30 MED ORDER — POTASSIUM CHLORIDE ER 10 MEQ PO TBCR: 10.0000 meq | EXTENDED_RELEASE_TABLET | Freq: Every day | ORAL | Status: DC

## 2012-04-30 MED ORDER — FUROSEMIDE 40 MG PO TABS: 40.0000 mg | ORAL_TABLET | Freq: Every day | ORAL | Status: DC

## 2012-04-30 NOTE — Assessment & Plan Note (Signed)
BP Readings from Last 3 Encounters:  04/30/12 126/72  12/12/11 127/85  04/04/11 147/90    Lab Results  Component Value Date   NA 145 12/12/2011   K 3.3* 12/12/2011   CREATININE 0.90 12/12/2011    Assessment: Blood pressure control: controlled Progress toward BP goal:  at goal Comments: Doing well on amlodipine 5 mg daily, benazepril 40 mg daily, and furosemide 40 mg daily.  Plan: Medications:  continue current medications Educational resources provided: brochure;video Self management tools provided: home blood pressure logbook

## 2012-04-30 NOTE — Patient Instructions (Addendum)
General Instructions: Please keep appointment for CT scan of the chest to follow pulmonary nodules. Please complete and return the Hemoccult cards as instructed.    Treatment Goals:  Goals (1 Years of Data) as of 04/30/12         As of Today As of Today 12/12/11 04/04/11 01/31/11     Blood Pressure    . Blood Pressure < 140/90  126/72 146/95 127/85 147/90      Result Component    . LDL CALC < 130      116      Progress Toward Treatment Goals:  Treatment Goal 04/30/2012  Blood pressure at goal    Self Care Goals & Plans:  Self Care Goal 04/30/2012  Manage my medications take my medicines as prescribed; bring my medications to every visit  Eat healthy foods eat baked foods instead of fried foods; eat foods that are low in salt  Be physically active find an activity I enjoy       Care Management & Community Referrals:  Referral 04/30/2012  Referrals made for care management support none needed  Referrals made to community resources none

## 2012-04-30 NOTE — Assessment & Plan Note (Signed)
Wt Readings from Last 3 Encounters:  04/30/12 159 lb 4.8 oz (72.258 kg)  12/12/11 148 lb 3.2 oz (67.223 kg)  04/04/11 165 lb 1.6 oz (74.889 kg)    Assessment: Patient has gained 11 pounds since his last visit here, and reports improved appetite.  He again declined colonoscopy, as he has done in the past.   Plan: No further workup planned at this time; I encouraged good oral intake.

## 2012-04-30 NOTE — Progress Notes (Signed)
  Subjective:    Patient ID: Tommy Morris, male    DOB: April 20, 1940, 72 y.o.   MRN: [id number]  HPI Patient returns for followup of his chronic atrial fibrillation, hypertension, and other medical problems.  He has no acute complaints today, and reports that he has been doing well.  He reports that he has been compliant with his medications.  He reports that he followed up with his urologist since his last visit here.  His appetite and his weight have improved since his last visit, and he has gained back about 10 pounds.  He reports that he is currently drinking about 3 alcoholic drinks per day, and plans to cut down.  He has not yet had the previously ordered CT scan of the chest, which was ordered to follow up previously noted pulmonary nodules   Review of Systems  Constitutional: Negative for fever, chills and diaphoresis.  HENT: Positive for tinnitus (Chronic right tinnitus for years). Negative for hearing loss.   Eyes: Negative for visual disturbance.  Respiratory: Negative for cough, shortness of breath and wheezing.   Cardiovascular: Negative for chest pain, palpitations and leg swelling.  Gastrointestinal: Negative for nausea, vomiting, abdominal pain and blood in stool.  Genitourinary: Negative for dysuria and difficulty urinating.  Musculoskeletal: Negative for myalgias and arthralgias.  Skin: Negative for rash.  Neurological: Negative for syncope, weakness and numbness.       Objective:   Physical Exam  Constitutional: He appears well-nourished. No distress.  Cardiovascular: S1 normal and S2 normal.  An irregularly irregular rhythm present. Exam reveals no S3 and no S4.   No murmur heard. Abdominal: Soft. Bowel sounds are normal. There is no hepatosplenomegaly. There is no tenderness.  Musculoskeletal: He exhibits no edema.       Assessment & Plan:

## 2012-04-30 NOTE — Assessment & Plan Note (Signed)
Assessment: Patient still has not followed up for the recommended chest CT scan to monitor two small right lung nodules noted in April of 2011. I discussed again the importance of having a follow-up chest CT scan, and he agreed to have the scan done.    Plan: We will reschedule the CT scan.

## 2012-04-30 NOTE — Assessment & Plan Note (Signed)
Assessment: Patient has chronic atrial fibrillation.  His rate is well controlled both at rest and with ambulation in clinic today.  I again discussed the issue of anticoagulation for his atrial fibrillation In order to prevent strokes; the patient does not want to go on anticoagulation and prefers to continue aspirin 81 mg daily.  Plan: Continue aspirin 81 mg daily.  I advised patient to let me know if he has any tachycardia, palpitations, or other symptoms related to his atrial fibrillation.

## 2012-12-10 ENCOUNTER — Inpatient Hospital Stay (HOSPITAL_COMMUNITY): Payer: Medicare Other

## 2012-12-10 ENCOUNTER — Ambulatory Visit: Payer: Medicare Other | Admitting: Internal Medicine

## 2012-12-10 ENCOUNTER — Inpatient Hospital Stay (HOSPITAL_COMMUNITY)
Admission: EM | Admit: 2012-12-10 | Discharge: 2012-12-11 | DRG: 684 | Disposition: A | Discharge status: Home / Self Care | Payer: Medicare Other | Attending: Internal Medicine | Admitting: Internal Medicine

## 2012-12-10 ENCOUNTER — Encounter: Payer: Medicare Other | Admitting: Internal Medicine

## 2012-12-10 ENCOUNTER — Emergency Department (HOSPITAL_COMMUNITY): Payer: Medicare Other

## 2012-12-10 ENCOUNTER — Encounter (HOSPITAL_COMMUNITY): Payer: Self-pay | Admitting: Emergency Medicine

## 2012-12-10 DIAGNOSIS — N320 Bladder-neck obstruction: Secondary | ICD-10-CM | POA: Diagnosis present

## 2012-12-10 DIAGNOSIS — Z91199 Patient's noncompliance with other medical treatment and regimen due to unspecified reason: Secondary | ICD-10-CM

## 2012-12-10 DIAGNOSIS — C679 Malignant neoplasm of bladder, unspecified: Secondary | ICD-10-CM | POA: Diagnosis present

## 2012-12-10 DIAGNOSIS — N401 Enlarged prostate with lower urinary tract symptoms: Secondary | ICD-10-CM | POA: Diagnosis present

## 2012-12-10 DIAGNOSIS — N329 Bladder disorder, unspecified: Secondary | ICD-10-CM

## 2012-12-10 DIAGNOSIS — Z87891 Personal history of nicotine dependence: Secondary | ICD-10-CM

## 2012-12-10 DIAGNOSIS — R338 Other retention of urine: Secondary | ICD-10-CM

## 2012-12-10 DIAGNOSIS — N179 Acute kidney failure, unspecified: Principal | ICD-10-CM

## 2012-12-10 DIAGNOSIS — I10 Essential (primary) hypertension: Secondary | ICD-10-CM | POA: Diagnosis present

## 2012-12-10 DIAGNOSIS — R339 Retention of urine, unspecified: Secondary | ICD-10-CM

## 2012-12-10 DIAGNOSIS — N281 Cyst of kidney, acquired: Secondary | ICD-10-CM | POA: Diagnosis present

## 2012-12-10 DIAGNOSIS — M109 Gout, unspecified: Secondary | ICD-10-CM | POA: Diagnosis present

## 2012-12-10 DIAGNOSIS — Z8559 Personal history of malignant neoplasm of other urinary tract organ: Secondary | ICD-10-CM

## 2012-12-10 DIAGNOSIS — N138 Other obstructive and reflux uropathy: Secondary | ICD-10-CM | POA: Diagnosis present

## 2012-12-10 DIAGNOSIS — Z9119 Patient's noncompliance with other medical treatment and regimen: Secondary | ICD-10-CM

## 2012-12-10 DIAGNOSIS — I4891 Unspecified atrial fibrillation: Secondary | ICD-10-CM

## 2012-12-10 DIAGNOSIS — E876 Hypokalemia: Secondary | ICD-10-CM | POA: Diagnosis present

## 2012-12-10 DIAGNOSIS — Z8551 Personal history of malignant neoplasm of bladder: Secondary | ICD-10-CM

## 2012-12-10 DIAGNOSIS — Z820 Family history of epilepsy and other diseases of the nervous system: Secondary | ICD-10-CM

## 2012-12-10 DIAGNOSIS — Z79899 Other long term (current) drug therapy: Secondary | ICD-10-CM

## 2012-12-10 DIAGNOSIS — F1010 Alcohol abuse, uncomplicated: Secondary | ICD-10-CM | POA: Diagnosis present

## 2012-12-10 DIAGNOSIS — E7800 Pure hypercholesterolemia, unspecified: Secondary | ICD-10-CM | POA: Diagnosis present

## 2012-12-10 LAB — URINALYSIS, ROUTINE W REFLEX MICROSCOPIC
Specific Gravity, Urine: 1.011 (ref 1.005–1.030)
Urobilinogen, UA: 0.2 mg/dL (ref 0.0–1.0)
pH: 5 (ref 5.0–8.0)

## 2012-12-10 LAB — URINE MICROSCOPIC-ADD ON

## 2012-12-10 LAB — CBC
MCH: 34.3 pg — ABNORMAL HIGH (ref 26.0–34.0)
MCV: 93.1 fL (ref 78.0–100.0)
Platelets: 269 10*3/uL (ref 150–400)
RBC: 4.66 MIL/uL (ref 4.22–5.81)
RDW: 13 % (ref 11.5–15.5)

## 2012-12-10 LAB — POCT I-STAT, CHEM 8
Chloride: 103 mEq/L (ref 96–112)
Creatinine, Ser: 5.1 mg/dL — ABNORMAL HIGH (ref 0.50–1.35)
HCT: 47 % (ref 39.0–52.0)
Hemoglobin: 16 g/dL (ref 13.0–17.0)
Potassium: 2.9 mEq/L — ABNORMAL LOW (ref 3.5–5.1)
Sodium: 142 mEq/L (ref 135–145)

## 2012-12-10 LAB — COMPREHENSIVE METABOLIC PANEL
ALT: 52 U/L (ref 0–53)
BUN: 72 mg/dL — ABNORMAL HIGH (ref 6–23)
CO2: 29 mEq/L (ref 19–32)
Calcium: 9.5 mg/dL (ref 8.4–10.5)
Creatinine, Ser: 2.85 mg/dL — ABNORMAL HIGH (ref 0.50–1.35)
GFR calc Af Amer: 24 mL/min — ABNORMAL LOW (ref >90)
GFR calc non Af Amer: 20 mL/min — ABNORMAL LOW (ref >90)
Glucose, Bld: 80 mg/dL (ref 70–99)
Sodium: 146 mEq/L — ABNORMAL HIGH (ref 135–145)
Total Bilirubin: 0.7 mg/dL (ref 0.3–1.2)
Total Protein: 7.8 g/dL (ref 6.0–8.3)

## 2012-12-10 LAB — PROTIME-INR: INR: 1.14 (ref 0.00–1.49)

## 2012-12-10 LAB — PHOSPHORUS: Phosphorus: 4.1 mg/dL (ref 2.3–4.6)

## 2012-12-10 MED ORDER — LORAZEPAM 0.5 MG PO TABS
0.5000 mg | ORAL_TABLET | Freq: Once | ORAL | Status: AC
  Administered 2012-12-10: 0.5 mg via ORAL

## 2012-12-10 MED ORDER — LORAZEPAM 1 MG PO TABS
1.0000 mg | ORAL_TABLET | Freq: Four times a day (QID) | ORAL | Status: DC | PRN
  Filled 2012-12-10: qty 1

## 2012-12-10 MED ORDER — HYDRALAZINE HCL 25 MG PO TABS
25.0000 mg | ORAL_TABLET | Freq: Three times a day (TID) | ORAL | Status: DC
  Administered 2012-12-10: 25 mg via ORAL
  Filled 2012-12-10 (×6): qty 1

## 2012-12-10 MED ORDER — HEPARIN SODIUM (PORCINE) 5000 UNIT/ML IJ SOLN
5000.0000 [IU] | Freq: Three times a day (TID) | INTRAMUSCULAR | Status: DC
  Administered 2012-12-10 – 2012-12-11 (×3): 5000 [IU] via SUBCUTANEOUS
  Filled 2012-12-10 (×6): qty 1

## 2012-12-10 MED ORDER — ASPIRIN EC 81 MG PO TBEC
81.0000 mg | DELAYED_RELEASE_TABLET | Freq: Every day | ORAL | Status: DC
  Administered 2012-12-10 – 2012-12-11 (×2): 81 mg via ORAL
  Filled 2012-12-10 (×2): qty 1

## 2012-12-10 MED ORDER — ADULT MULTIVITAMIN W/MINERALS CH
1.0000 | ORAL_TABLET | Freq: Every day | ORAL | Status: DC
  Administered 2012-12-10 – 2012-12-11 (×2): 1 via ORAL
  Filled 2012-12-10 (×2): qty 1

## 2012-12-10 MED ORDER — SODIUM CHLORIDE 0.9 % IJ SOLN: 3.0000 mL | INTRAMUSCULAR | Status: DC | PRN

## 2012-12-10 MED ORDER — SODIUM CHLORIDE 0.9 % IJ SOLN
3.0000 mL | Freq: Two times a day (BID) | INTRAMUSCULAR | Status: DC
  Administered 2012-12-10 – 2012-12-11 (×3): 3 mL via INTRAVENOUS

## 2012-12-10 MED ORDER — AMLODIPINE BESYLATE 10 MG PO TABS
10.0000 mg | ORAL_TABLET | Freq: Every day | ORAL | Status: DC
  Administered 2012-12-10 – 2012-12-11 (×2): 10 mg via ORAL
  Filled 2012-12-10 (×2): qty 1

## 2012-12-10 MED ORDER — THIAMINE HCL 100 MG/ML IJ SOLN
100.0000 mg | Freq: Every day | INTRAMUSCULAR | Status: DC
  Filled 2012-12-10 (×2): qty 1

## 2012-12-10 MED ORDER — FOLIC ACID 1 MG PO TABS
1.0000 mg | ORAL_TABLET | Freq: Every day | ORAL | Status: DC
  Administered 2012-12-10 – 2012-12-11 (×2): 1 mg via ORAL
  Filled 2012-12-10 (×2): qty 1

## 2012-12-10 MED ORDER — SODIUM CHLORIDE 0.9 % IV SOLN: 250.0000 mL | INTRAVENOUS | Status: DC | PRN

## 2012-12-10 MED ORDER — SODIUM CHLORIDE 0.9 % IJ SOLN: 3.0000 mL | Freq: Two times a day (BID) | INTRAMUSCULAR | Status: DC

## 2012-12-10 MED ORDER — VITAMIN B-1 100 MG PO TABS
100.0000 mg | ORAL_TABLET | Freq: Every day | ORAL | Status: DC
  Administered 2012-12-10 – 2012-12-11 (×2): 100 mg via ORAL
  Filled 2012-12-10 (×2): qty 1

## 2012-12-10 MED ORDER — LORAZEPAM 2 MG/ML IJ SOLN: 0.5000 mg | Freq: Once | INTRAMUSCULAR | Status: DC

## 2012-12-10 MED ORDER — LORAZEPAM 2 MG/ML IJ SOLN: 1.0000 mg | Freq: Four times a day (QID) | INTRAMUSCULAR | Status: DC | PRN

## 2012-12-10 NOTE — ED Notes (Addendum)
Foley Catheter inserted by Florence Canner, EMT

## 2012-12-10 NOTE — ED Notes (Signed)
Bladder scanner showed >999 per Florence Canner, EMT

## 2012-12-10 NOTE — ED Notes (Signed)
Patient transported to Ultrasound 

## 2012-12-10 NOTE — Progress Notes (Signed)
Pt's heart rate has been increasing from the 140's-180's while at rest. Pt asymptomatic. MD made aware and stated to continue to monitor. Will f/u.

## 2012-12-10 NOTE — ED Provider Notes (Signed)
CSN: [id number]     Arrival date & time 12/10/12  0605 History   First MD Initiated Contact with Patient 12/10/12 [phone]     Chief Complaint  Patient presents with  . Urinary Retention   (Consider location/radiation/quality/duration/timing/severity/associated sxs/prior Treatment) The history is provided by the patient. No language interpreter was used.   Tommy Morris Is a 72 year old male with a past medical history of bladder carcinoma, acute urinary retention with acute kidney injury, alcohol abuse who presents emergency Department with chief complaint of acute urinary retention.  Patient states that he was playing cards one week ago and was drinking liquor heavily.  He states that during that time he could feel the beginnings of urinary retention.  He has been urinating in small amounts however he has been unable to fully empty his bladder.  He has had some dribbling at night.  This morning his retention became acute with abdominal swelling and pain he was unable to urinate at all and came into the emergency department seeking relief. Denies fevers, chills, myalgias, arthralgias. Denies DOE, SOB, chest tightness or pressure, radiation to left arm, jaw or back, or diaphoresis. Denies dysuria, flank pain,, or hematuria. Denies headaches, light headedness, weakness, visual disturbances. Denies nausea, vomiting, diarrhea or constipation.     Past Medical History  Diagnosis Date  . Atrial fibrillation 04/27/2009    2-D echocardiogram 05/03/2009 showed mildly to moderately reduced LV systolic function with estimated ejection fraction of 40% to 45%; diffuse hypokinesis; trivial aortic regurgitation; mild mitral valve prolapse; mild to moderate mitral regurgitation; mildly dilated right and left atria.  Patient declined anticoagulation for stroke prophylaxis.  [name] Hypertension   . Hypercholesteremia   . 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  . Gout   . Diverticulosis of colon   . Tinnitus   . Pulmonary nodule     Two small right lung nodules on chest CT scan 04/25/2009; follow-up CT advised in 6-12 months.  . Normocytic anemia   . Heme positive stool     Noted during hospitalization with acute urinary retention in April 2011; referral was advised for a colonoscopy but patient declined  . Acute urinary retention 04/24/2009    Hospitalized 04/24/2009 with acute urinary retention and Cr=4.78; creatinine normalized by 04/27/2009 following Foley catheter placement; patient followed up with Dr. Annabell Howells, and the Foley catheter was removed without problems.  . Acute renal failure     Hospitalized 04/24/2009 with acute urinary retention and Cr=4.78; creatinine normalized by 04/27/2009 following Foley catheter placement; patient followed up with Dr. Annabell Howells.  . Alcohol abuse    Past Surgical History  Procedure Laterality Date  . Transurethral resection of bladder tumor  10/26/2004    S/P transrectal ultrasound-guided biopsy of prostate, cystourethroscopy, and TURBT on 10/26/2004; pathology of bladder tumor showed papillary urothelial carcinoma with no invasion identified; needle biopsies of prostate showed benign prostate tissue.   Family History  Problem Relation Age of Onset  . Alzheimer's disease Mother     Died in 12 at age 35.  . Cancer Neg Hx   . Diabetes Neg Hx   . Hypertension Neg Hx   . Heart disease Neg Hx    History  Substance Use Topics  . Smoking status: Former Smoker -- 1.00 packs/day for 50 years    Quit  date: 01/22/2002  . Smokeless tobacco: Former Neurosurgeon    Types: Chew    Quit date: 01/23/1964     Comment: Used for less than 1 year, quit over 40 years ago.  . Alcohol Use: 0.0 oz/week     Comment: Average of about 3 drinks  per day.    Review of Systems Ten systems reviewed and are negative for acute change, except as noted in the HPI.   Allergies  Review of patient's allergies indicates no known allergies.  Home Medications   Current Outpatient Rx  Name  Route  Sig  Dispense  Refill  . amLODipine (NORVASC) 5 MG tablet   Oral   Take 1 tablet (5 mg total) by mouth daily.   30 tablet   11   . aspirin 81 MG EC tablet   Oral   Take 1 tablet (81 mg total) by mouth daily.   100 tablet   3   . benazepril (LOTENSIN) 40 MG tablet   Oral   Take 1 tablet (40 mg total) by mouth daily.   30 tablet   11   . furosemide (LASIX) 40 MG tablet   Oral   Take 1 tablet (40 mg total) by mouth daily.   30 tablet   11   . potassium chloride (K-DUR) 10 MEQ tablet   Oral   Take 1 tablet (10 mEq total) by mouth daily.   30 tablet   6    There were no vitals taken for this visit. Physical Exam  Nursing note and vitals reviewed. Constitutional: He is oriented to person, place, and time. He appears well-developed and well-nourished. No distress.  HENT:  Head: Normocephalic and atraumatic.  Eyes: Conjunctivae are normal. No scleral icterus.  Neck: Normal range of motion. Neck supple.  Cardiovascular: Normal rate, regular rhythm and normal heart sounds.   Pulmonary/Chest: Effort normal and breath sounds normal. No respiratory distress.  Abdominal: Soft. There is no tenderness.  Genitourinary:  The Foley catheter in place urine draining appropriately  Musculoskeletal: He exhibits no edema.  Neurological: He is alert and oriented to person, place, and time.  Skin: Skin is warm and dry. He is not diaphoretic.  Psychiatric: His behavior is normal.    ED Course  Procedures (including critical care time) Labs Review Labs Reviewed  URINALYSIS, ROUTINE W REFLEX MICROSCOPIC   Imaging Review No results found.  EKG Interpretation   None       Date: 12/10/2012  Rate: 96  Rhythm: atrial fibrillation   QRS Axis: normal  Intervals: normal  ST/T Wave abnormalities: normal  Conduction Disutrbances:none  Narrative Interpretation:   Old EKG Reviewed: unchanged   MDM  No diagnosis found. 7:37 AM Filed Vitals:   12/10/12 0715  BP: 147/99  Pulse: 100   Patient seen after insertion of Foley catheter. [name] He states he is feeling much better and his pain is relieved.  Bladder scan measured at its highest reading. 2 L of fluid were removed from the bladder. Patient Has AKI of with BUN/ Creatinine 86/5.10. I have discussed the case with Dr. Evelena Leyden the patient needs admission. I have placed call to internal medicine   8:08 AM BP 147/99  Pulse 100  SpO2 97% Patient accepted by Dr. Dierdre Searles internal med. Teaching service.  Arthor Captain, PA-C 12/10/12 1601

## 2012-12-10 NOTE — H&P (Signed)
Date: 12/10/2012               Patient Name:  Tommy Morris MRN: [id number]  DOB: [birth year] Age / Sex: 72 y.o., male   PCP: Farley Ly, MD         Medical Service: Internal Medicine Teaching Service         Attending Physician: Dr. Inez Catalina, MD    First Contact: Dr. Andrey Campanile Pager: [phone]  Second Contact: Dr. Burtis Junes Pager: [phone]       After Hours (After 5p/  First Contact Pager: [phone]  weekends / holidays): Second Contact Pager: [phone]   Chief Complaint: acute urinary retention  History of Present Illness:  Patient is a 72 year-old man with a PMH of bladder carcinoma diagnosed in 2006, A Fib not on anticoagulation therapy, HTN, HLD and alcohol abuse with last drink one week ago per patient, who presented with acute urinary retention.   Patient states that he was playing cards and drinking liquor heavily one week ago when he noticed the feeling of the beginnings of urinary retention. He could only urinate in small amounts and unable to completely empty his bladder. He also endorses urinary dribbling at night. He states that his symptoms became worse this morning, and he was unable to urinate at all and noticed abdominal swelling. He came to the ED and was noted to have urinary retention of 2000 ml. A foley catheter was inserted. He was also noted to have acute renal failure with BUN/CR of 86/5.01. The IMTS is called for admission.   Denies fevers, chills, DOE, SOB, chest tightness or pressure, radiation to left arm, jaw or back, or diaphoresis. Denies dysuria, flank pain,, or hematuria. Denies headaches, light headedness, weakness, visual disturbances. Denies nausea, vomiting, diarrhea or constipation.  Of note, he was hospitalized in 2011 for acute renal failure 2/2 acute urinary retention. His Cr was 4.78 and normalized after foley insertion. He followed with Dr. Annabell Howells.   Meds: Current Facility-Administered Medications  Medication Dose Route Frequency Provider Last  Rate Last Dose  . 0.9 %  sodium chloride infusion  250 mL Intravenous PRN Elley Harp, MD      . amLODipine (NORVASC) tablet 10 mg  10 mg Oral Daily Tomoki Lucken, MD   10 mg at 12/10/12 1523  . aspirin EC tablet 81 mg  81 mg Oral Daily Cynthia Cogle, MD   81 mg at 12/10/12 1523  . folic acid (FOLVITE) tablet 1 mg  1 mg Oral Daily Ammi Hutt, MD   1 mg at 12/10/12 1523  . heparin injection 5,000 Units  5,000 Units Subcutaneous Q8H Kaylaann Mountz, MD   5,000 Units at 12/10/12 1523  . hydrALAZINE (APRESOLINE) tablet 25 mg  25 mg Oral Q8H Roshini Fulwider, MD   25 mg at 12/10/12 1523  . LORazepam (ATIVAN) tablet 1 mg  1 mg Oral Q6H PRN Dede Query, MD       Or  . LORazepam (ATIVAN) injection 1 mg  1 mg Intravenous Q6H PRN Adana Marik, MD      . multivitamin with minerals tablet 1 tablet  1 tablet Oral Daily Anyiah Coverdale, MD   1 tablet at 12/10/12 1523  . sodium chloride 0.9 % injection 3 mL  3 mL Intravenous Q12H Landrie Beale, MD      . sodium chloride 0.9 % injection 3 mL  3 mL Intravenous Q12H Emrah Ariola, MD      . sodium chloride 0.9 %  injection 3 mL  3 mL Intravenous PRN Meredith Kilbride, MD      . thiamine (VITAMIN B-1) tablet 100 mg  100 mg Oral Daily Dorathy Stallone, MD   100 mg at 12/10/12 1345   Or  . thiamine (B-1) injection 100 mg  100 mg Intravenous Daily Rajohn Henery Dierdre Searles, MD        Allergies: Allergies as of 12/10/2012  . (No Known Allergies)   Past Medical History  Diagnosis Date  . Atrial fibrillation 04/27/2009    2-D echocardiogram 05/03/2009 showed mildly to moderately reduced LV systolic function with estimated ejection fraction of 40% to 45%; diffuse hypokinesis; trivial aortic regurgitation; mild mitral valve prolapse; mild to moderate mitral regurgitation; mildly dilated right and left atria.  Patient declined anticoagulation for stroke prophylaxis.  [name] Hypertension   . Hypercholesteremia   . Bladder carcinoma 10/26/2004    S/P transrectal ultrasound-guided biopsy of prostate, cystourethroscopy, and TURBT on 10/26/2004; pathology of bladder tumor showed papillary urothelial  carcinoma with no invasion identified; needle biopsies of prostate showed benign prostate tissue.  . Gout   . Diverticulosis of colon   . Tinnitus   . Pulmonary nodule     Two small right lung nodules on chest CT scan 04/25/2009; follow-up CT advised in 6-12 months.  . Normocytic anemia   . Heme positive stool     Noted during hospitalization with acute urinary retention in April 2011; referral was advised for a colonoscopy but patient declined  . Acute urinary retention 04/24/2009    Hospitalized 04/24/2009 with acute urinary retention and Cr=4.78; creatinine normalized by 04/27/2009 following Foley catheter placement; patient followed up with Dr. Annabell Howells, and the Foley catheter was removed without problems.  . Acute renal failure     Hospitalized 04/24/2009 with acute urinary retention and Cr=4.78; creatinine normalized by 04/27/2009 following Foley catheter placement; patient followed up with Dr. Annabell Howells.  . Alcohol abuse    Past Surgical History  Procedure Laterality Date  . Transurethral resection of bladder tumor  10/26/2004    S/P transrectal ultrasound-guided biopsy of prostate, cystourethroscopy, and TURBT on 10/26/2004; pathology of bladder tumor showed papillary urothelial carcinoma with no invasion identified; needle biopsies of prostate showed benign prostate tissue.   Family History  Problem Relation Age of Onset  . Alzheimer's disease Mother     Died in 67 at age 23.  . Cancer Neg Hx   . Diabetes Neg Hx   . Hypertension Neg Hx   . Heart disease Neg Hx    History   Social History  . Marital Status: Married    Spouse Name: N/A    Number of Children: N/A  . Years of Education: N/A   Occupational History  . Not on file.   Social History Main Topics  . Smoking status: Former Smoker -- 1.00 packs/day for 50 years    Quit date: 01/22/2002  . Smokeless tobacco: Former Neurosurgeon    Types: Chew    Quit date: 01/23/1964     Comment: Used for less than 1 year, quit over 40 years  ago.  . Alcohol Use: 0.0 oz/week     Comment: Average of about 3 drinks per day.  . Drug Use: Not on file  . Sexual Activity: Not on file   Other Topics Concern  . Not on file   Social History Narrative  . No narrative on file    Review of Systems: Review of Systems:  Constitutional:  Denies fever, chills, diaphoresis, appetite change  and fatigue.   HEENT:  Denies congestion, sore throat, rhinorrhea, sneezing, mouth sores, trouble swallowing, neck pain   Respiratory:  Denies SOB, DOE, cough, and wheezing.   Cardiovascular:  Denies palpitations and leg swelling.   Gastrointestinal:  Denies nausea, vomiting, abdominal pain, diarrhea, constipation, blood in stool and abdominal distention.   Genitourinary:  Denies dysuria, urgency, frequency, hematuria, flank pain and positive for difficulty urinating and abdominal distention   Musculoskeletal:  Denies myalgias, back pain, joint swelling, arthralgias and gait problem.   Skin:  Denies pallor, rash and wound.   Neurological:  Denies dizziness, seizures, syncope, weakness, light-headedness, numbness and headaches.    .    Physical Exam: Blood pressure 140/85, pulse 84, temperature 98 F (36.7 C), temperature source Oral, resp. rate 20, SpO2 100.00%. General: alert, well-developed, and cooperative to examination.  Head: normocephalic and atraumatic.  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 pupils equal, pupils round, pupils reactive to light, no injection and anicteric.  Mouth: pharynx pink and moist, no erythema, and no exudates.  Neck: supple, full ROM, no thyromegaly, no JVD, and no carotid bruits.  Lungs: normal respiratory effort, no accessory muscle use, normal breath sounds, no crackles, and no wheezes. Heart: normal rate, regular rhythm, no gallop, and no rub. 3/6 systolic murmur noted at apex radiating to axillary.  Abdomen: soft, non-tender, normal bowel sounds, no distention, no guarding, no rebound  tenderness, no hepatomegaly, and no splenomegaly.  Msk: no joint swelling, no joint warmth, and no redness over joints.  Pulses: 2+ DP/PT pulses bilaterally Extremities: No cyanosis, clubbing, edema Neurologic: alert & oriented X3, cranial nerves II-XII intact, strength normal in all extremities, sensation intact to light touch, and gait normal.  Skin: turgor normal and no rashes.  Psych: Oriented X3, memory intact for recent and remote, normally interactive, good eye contact, not anxious appearing, and not depressed appearing.   Lab results: Basic Metabolic Panel:  Recent Labs  16/10/96 0716 12/10/12 1500  Verbie Babic 142 146*  K 2.9* 3.2*  CL 103 104  CO2  --  29  GLUCOSE 137* 80  BUN 86* 72*  CREATININE 5.10* 2.85*  CALCIUM  --  9.5  MG  --  2.3  PHOS  --  4.1   CBC:  Recent Labs  12/10/12 0716 12/10/12 1500  WBC  --  8.1  HGB 16.0 16.0  HCT 47.0 43.4  MCV  --  93.1  PLT  --  269   Urine Drug Screen: Drugs of Abuse     Component Value Date/Time   LABOPIA POSITIVE* 04/25/2009 0034   COCAINSCRNUR NONE DETECTED 04/25/2009 0034   LABBENZ NONE DETECTED 04/25/2009 0034   AMPHETMU NONE DETECTED 04/25/2009 0034   THCU NONE DETECTED 04/25/2009 0034   LABBARB  Value: NONE DETECTED        DRUG SCREEN FOR MEDICAL PURPOSES ONLY.  IF CONFIRMATION IS NEEDED FOR ANY PURPOSE, NOTIFY LAB WITHIN 5 DAYS.        LOWEST DETECTABLE LIMITS FOR URINE DRUG SCREEN Drug Class       Cutoff (ng/mL) Amphetamine      1000 Barbiturate      200 Benzodiazepine   200 Tricyclics       300 Opiates          300 Cocaine          300 THC              50 04/25/2009 0034  Urinalysis:  Recent Labs  12/10/12 0700  COLORURINE YELLOW  LABSPEC 1.011  PHURINE 5.0  GLUCOSEU NEGATIVE  HGBUR LARGE*  BILIRUBINUR NEGATIVE  KETONESUR NEGATIVE  PROTEINUR NEGATIVE  UROBILINOGEN 0.2  NITRITE NEGATIVE  LEUKOCYTESUR NEGATIVE     Imaging results:  US Renal  12/10/2012   CLINICAL DATA:  Urinary retention.  Hypertension.   EXAM: RENAL/URINARY TRACT ULTRASOUND COMPLETE  COMPARISON:  04/24/2009  FINDINGS: Right Kidney  Length: 12.1 cm. Normal echogenicity. There is mild hydronephrosis. Multiple cysts are noted within the right kidney. The largest is in the lower pole measuring 5.3 x 4.2 x 5.7 cm. Septation is noted stress set this dominant cyst is complicated by an internal area thin septation.  Left Kidney  Length: 13 cm. Normal echogenicity. Multiple cysts are noted. The largest is in the upper pole measuring 3.1 cm. Within the inferior pole there is a cyst measuring 1.6 x 1.4 x 1.9 cm. This cyst is hypoechoic and contains diffuse internal echoes.  Bladder  Thick walled urinary bladder is identified. Soft tissue filling defect is identified within the bladder base. There is blood flow identified within this defect which is suspicious for a mass. This measures 2.7 x 1.7 x 3.1 cm.  Other: The prostate gland is enlarged. The gland measures 5.1 x 4.7 x 5.6 cm.  IMPRESSION: 1. Prostate gland enlargement. There is bladder wall thickening which may reflect chronic bladder outlet obstruction. 2. Suspicious lesion is identified within the bladder base. Cannot rule out urothelial tumor. Consider further evaluation with either contrast-enhanced hematuria protocol CT or direct visualization. 3. Bilateral renal cysts. At least two these cysts do not meet the criteria for simple cyst. Further assessment with contrast enhanced cross-sectional imaging is advised. The study of choice would be an MRI. 4. Mild right hydronephrosis.   Electronically Signed   By: Signa Kell M.D.   On: 12/10/2012 08:38   Portable Chest 1 View  12/10/2012   CLINICAL DATA:  History of hypertension and urinary retention  EXAM: PORTABLE CHEST - 1 VIEW  COMPARISON:  Chest CT 04/25/2009  FINDINGS: The heart size and mediastinal contours are within normal limits. Both lungs are clear. The visualized skeletal structures are unremarkable.  IMPRESSION: No active disease.    Electronically Signed   By: Salome Holmes M.D.   On: 12/10/2012 14:20   Renal ultrasound on 11/19 1. Prostate gland enlargement. There is bladder wall thickening  which may reflect chronic bladder outlet obstruction.  2. Suspicious lesion is identified within the bladder base. Cannot  rule out urothelial tumor. Consider further evaluation with either  contrast-enhanced hematuria protocol CT or direct visualization.  3. Bilateral renal cysts. At least two these cysts do not meet the  criteria for simple cyst. Further assessment with contrast enhanced  cross-sectional imaging is advised. The study of choice would be an  MRI.  4. Mild right hydronephrosis.  Assessment & Plan by Problem:  Patient is a 72 year-old man with a PMH of bladder carcinoma diagnosed in 2006, A Fib not on anticoagulation therapy, HTN, HLD and alcohol abuse with last drink one week ago per patient, who presented with acute urinary retention  #Acute renal failure and Acute urinary retention, baseline Cr 0.94    Patient presents with acute renal failure likely post renal in the setting of acute urinary retention.  His home medication of Lasix and ACEI certainly complicated his renal failure. No indication of prerenal or intrarenal precipitating factors. Renal ultrasound indicated mild right hydronephrosis  and prostate enlargement. There is suspicious lesion noted within the bladder base.   - Admit to Tele - Continue foley catheter--likely need to discharge with foley and close follow up with Dr Annabell Howells. - monitor and trend renal function closely - hold ACEI and lasix. - Dr. Annabell Howells notified the admission and will follow   # HYPOKALEMIA  - Patient denies muscular pain or weakness. Baseline A fib on monitor with controlled rate at 90's.  His K from ISTAT was 2.9 at ED and was not replaced due to concerning of acute renal failure.   - Will repeat CMP and check Mg - caution K replacement and close monitoring in the setting  of acute renal failure  # CARCINOMA, BLADDER - Hx of bladder carcinoma diagnosed in 2006. 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 Annabell Howells and last OV was one year ago.  Renal ultrasound on admission showed " Suspicious lesion is identified within the bladder base. Cannot rule out urothelial tumor".  - Urology is consulted and will follow pt inpatient.   # HYPERTENSION    Home meds include Lotensin, Lasix, Amlodipine.   - Hols Lotensin and lasix in the setting of renal failure. - continue Amlodipine - Hydralazine 25 mg po TID added   # Atrial fibrillation-- not on anticoagulation therapy ( patient been refusing it) - per chart review, he was started Toprol XL 12.5 mg po daily for rate control but has not taken it. He has good rate control on admission.  Plan -  Tele monitoring - consider BB if tachy.    # Alcohol abuse. states last drink one week ago.  - CIWA - Sw.   # VTE: Heparin        Dispo: Disposition is deferred at this time, awaiting improvement of current medical problems. Anticipated discharge in approximately 2-3 day(s).   The patient does have a current PCP Farley Ly, MD) and does need an [facility] hospital follow-up appointment after discharge.  The patient does not have transportation limitations that hinder transportation to clinic appointments.  Signed: Dede Query, MD PGY-3. IMTS  12/10/2012, 4:10 PM

## 2012-12-10 NOTE — ED Notes (Signed)
Pt. reports urinary retention for several days , bladder pressure , denies injury / no dysuria or hematuria .

## 2012-12-10 NOTE — Progress Notes (Addendum)
Patient ID: Tommy Morris, male   DOB: 1940-07-16, 72 y.o.   MRN: [id number]   I spoke to Dr. Dede Query who has asked me to see Tommy Morris in consultation.  I concur that he has a probable recurrent bladder tumor and will need further evaluation of his retention.   I will see him either later today or tomorrow.   I came by and reviewed his chart and out records.   He was last seen in 2013 for a PSA and rectal exam.     He has a long history of BPH with BOO and has had prior retention and ARF with PVR of 2000cc back in 2006.  He had a TURBT for a papillary tumor and a  negative prostate biopsy in 2006.  His last cystoscopy was in 2011.   His Creatinine is falling with drainage.  I reviewed his Ultrasound and he has a worrisome bladder lesion.    He is going to need a cystoscopy with TURBT and possible TURP.     He was out of the room so I was unable to complete a consultation.

## 2012-12-11 ENCOUNTER — Other Ambulatory Visit: Payer: Self-pay

## 2012-12-11 LAB — BASIC METABOLIC PANEL
BUN: 41 mg/dL — ABNORMAL HIGH (ref 6–23)
CO2: 29 mEq/L (ref 19–32)
Calcium: 9 mg/dL (ref 8.4–10.5)
Creatinine, Ser: 1.4 mg/dL — ABNORMAL HIGH (ref 0.50–1.35)
GFR calc Af Amer: 56 mL/min — ABNORMAL LOW (ref >90)
Glucose, Bld: 77 mg/dL (ref 70–99)
Potassium: 3.3 mEq/L — ABNORMAL LOW (ref 3.5–5.1)

## 2012-12-11 LAB — CBC
HCT: 41.7 % (ref 39.0–52.0)
MCH: 33.5 pg (ref 26.0–34.0)
MCHC: 36 g/dL (ref 30.0–36.0)
MCV: 93.1 fL (ref 78.0–100.0)
Platelets: 260 10*3/uL (ref 150–400)
RDW: 12.7 % (ref 11.5–15.5)
WBC: 6.6 10*3/uL (ref 4.0–10.5)

## 2012-12-11 MED ORDER — METOPROLOL TARTRATE 25 MG PO TABS: 12.5000 mg | ORAL_TABLET | Freq: Two times a day (BID) | ORAL | Status: DC

## 2012-12-11 MED ORDER — LORAZEPAM 2 MG/ML IJ SOLN
0.5000 mg | Freq: Once | INTRAMUSCULAR | Status: AC
  Administered 2012-12-11: 0.5 mg via INTRAVENOUS
  Filled 2012-12-11: qty 1

## 2012-12-11 MED ORDER — POTASSIUM CHLORIDE CRYS ER 20 MEQ PO TBCR
40.0000 meq | EXTENDED_RELEASE_TABLET | Freq: Once | ORAL | Status: AC
  Administered 2012-12-11: 40 meq via ORAL
  Filled 2012-12-11: qty 2

## 2012-12-11 NOTE — Discharge Summary (Signed)
Patient Name:  Tommy Morris  MRN: [id number]  PCP: Farley Ly, MD  DOB:  May 17, 1940       Date of Admission:  12/10/2012  Date of Discharge:  12/11/2012      Attending Physician: Dr. Inez Catalina, MD         DISCHARGE DIAGNOSES: 1.   Acute renal failure 2.   CARCINOMA, BLADDER 3.   Hypercholesterolemia 4.   HYPOKALEMIA 5.   History of alcohol abuse 6.   HYPERTENSION 7.   Atrial fibrillation 8.   Acute urinary retention   DISPOSITION AND FOLLOW-UP: Tommy Morris is to follow-up with the listed providers as detailed below, at patient's visiting, please address following issues:  1) Please remind him of his Urology follow-up with Dr. Annabell Howells on 12/16/12 at 10:30AM.   His phone number was not in service so the office was unable to reach him with the appointment time.  2) AFib - rates into the 170s during admission; patient non-compliant with BB and refused anticoagulation in the past.  Agreed to Lopressor 12.5 mg BID at discharge.  Has he been taking this?  3) BP - metoprolol was added for Afib rate control.  Please assess for hypotension and requirement for current medications.   4) Check BMP - Is creatinine at baseline? Also, K low during admission and repleted at discharge.  He is on KDur daily at home.  5) Bilateral renal cysts - patient with 2 lesions on renal US that do not meet criteria for simple cysts.  This should be evaluated with MRI.  Follow-up Information   Follow up with Farley Ly, MD.   Specialty:  Internal Medicine   Contact information:   [address] [place] [address] [phone]       Follow up with Anner Crete, MD.   Specialty:  Urology   Contact information:   [address] [place] [address] [phone]          Discharge Orders   Future Appointments Provider Department Dept Phone   12/15/2012 9:15 AM Judie Bonus, MD [facility] [phone]   Future  Orders Complete By Expires   Call MD for:  difficulty breathing, headache or visual disturbances  As directed    Call MD for:  extreme fatigue  As directed    Call MD for:  persistant dizziness or light-headedness  As directed    Call MD for:  persistant nausea and vomiting  As directed    Call MD for:  severe uncontrolled pain  As directed    Call MD for:  temperature >100.4  As directed    Diet - low sodium heart healthy  As directed    Increase activity slowly  As directed        DISCHARGE MEDICATIONS:   Medication List         amLODipine 5 MG tablet  Commonly known as:  NORVASC  Take 1 tablet (5 mg total) by mouth daily.     aspirin 81 MG EC tablet  Take 1 tablet (81 mg total) by mouth daily.     benazepril 40 MG tablet  Commonly known as:  LOTENSIN  Take 1 tablet (40 mg total) by mouth daily.     furosemide 40 MG tablet  Commonly known as:  LASIX  Take 1 tablet (40 mg total) by mouth daily.     metoprolol tartrate 25 MG tablet  Commonly  known as:  LOPRESSOR  Take 0.5 tablets (12.5 mg total) by mouth 2 (two) times daily.     potassium chloride 10 MEQ tablet  Commonly known as:  K-DUR  Take 1 tablet (10 mEq total) by mouth daily.        CONSULTS:  Treatment Team:  Bjorn Pippin, MD    PROCEDURES PERFORMED:  US Renal  12/10/2012   CLINICAL DATA:  Urinary retention.  Hypertension.  EXAM: RENAL/URINARY TRACT ULTRASOUND COMPLETE  COMPARISON:  04/24/2009  FINDINGS: Right Kidney  Length: 12.1 cm. Normal echogenicity. There is mild hydronephrosis. Multiple cysts are noted within the right kidney. The largest is in the lower pole measuring 5.3 x 4.2 x 5.7 cm. Septation is noted stress set this dominant cyst is complicated by an internal area thin septation.  Left Kidney  Length: 13 cm. Normal echogenicity. Multiple cysts are noted. The largest is in the upper pole measuring 3.1 cm. Within the inferior pole there is a cyst measuring 1.6 x 1.4 x 1.9 cm. This cyst is  hypoechoic and contains diffuse internal echoes.  Bladder  Thick walled urinary bladder is identified. Soft tissue filling defect is identified within the bladder base. There is blood flow identified within this defect which is suspicious for a mass. This measures 2.7 x 1.7 x 3.1 cm.  Other: The prostate gland is enlarged. The gland measures 5.1 x 4.7 x 5.6 cm.  IMPRESSION: 1. Prostate gland enlargement. There is bladder wall thickening which may reflect chronic bladder outlet obstruction. 2. Suspicious lesion is identified within the bladder base. Cannot rule out urothelial tumor. Consider further evaluation with either contrast-enhanced hematuria protocol CT or direct visualization. 3. Bilateral renal cysts. At least two these cysts do not meet the criteria for simple cyst. Further assessment with contrast enhanced cross-sectional imaging is advised. The study of choice would be an MRI. 4. Mild right hydronephrosis.   Electronically Signed   By: Signa Kell M.D.   On: 12/10/2012 08:38   Portable Chest 1 View  12/10/2012   CLINICAL DATA:  History of hypertension and urinary retention  EXAM: PORTABLE CHEST - 1 VIEW  COMPARISON:  Chest CT 04/25/2009  FINDINGS: The heart size and mediastinal contours are within normal limits. Both lungs are clear. The visualized skeletal structures are unremarkable.  IMPRESSION: No active disease.   Electronically Signed   By: Salome Holmes M.D.   On: 12/10/2012 14:20       ADMISSION DATA: H&P: Patient is a 72 year-old man with a PMH of bladder carcinoma diagnosed in 2006, A Fib not on anticoagulation therapy, HTN, HLD and alcohol abuse with last drink one week ago per patient, who presented with acute urinary retention.  Patient states that he was playing cards and drinking liquor heavily one week ago when he noticed the feeling of the beginnings of urinary retention. He could only urinate in small amounts and unable to completely empty his bladder. He also endorses  urinary dribbling at night. He states that his symptoms became worse this morning, and he was unable to urinate at all and noticed abdominal swelling. He came to the ED and was noted to have urinary retention of 2000 ml. A foley catheter was inserted. He was also noted to have acute renal failure with BUN/CR of 86/5.01. The IMTS is called for admission.  Denies fevers, chills, DOE, SOB, chest tightness or pressure, radiation to left arm, jaw or back, or diaphoresis. Denies dysuria, flank pain,, or hematuria. Denies headaches,  light headedness, weakness, visual disturbances. Denies nausea, vomiting, diarrhea or constipation.  Of note, he was hospitalized in 2011 for acute renal failure 2/2 acute urinary retention. His Cr was 4.78 and normalized after foley insertion. He followed with Dr. Annabell Howells.   Physical Exam: Blood pressure 140/85, pulse 84, temperature 98 F (36.7 C), temperature source Oral, resp. rate 20, SpO2 100.00%.  General: alert, well-developed, and cooperative to examination.  Head: normocephalic and atraumatic.  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 pupils equal, pupils round, pupils reactive to light, no injection and anicteric.  Mouth: pharynx pink and moist, no erythema, and no exudates.  Neck: supple, full ROM, no thyromegaly, no JVD, and no carotid bruits.  Lungs: normal respiratory effort, no accessory muscle use, normal breath sounds, no crackles, and no wheezes. Heart: normal rate, regular rhythm, no gallop, and no rub. 3/6 systolic murmur noted at apex radiating to axillary.  Abdomen: soft, non-tender, normal bowel sounds, no distention, no guarding, no rebound tenderness, no hepatomegaly, and no splenomegaly.  Msk: no joint swelling, no joint warmth, and no redness over joints.  Pulses: 2+ DP/PT pulses bilaterally Extremities: No cyanosis, clubbing, edema Neurologic: alert & oriented X3, cranial nerves II-XII intact, strength normal in all  extremities, sensation intact to light touch, and gait normal.  Skin: turgor normal and no rashes.  Psych: Oriented X3, memory intact for recent and remote, normally interactive, good eye contact, not anxious appearing, and not depressed appearing.  Labs: Basic Metabolic Panel:   Recent Labs   12/10/12 0716  12/10/12 1500   NA  142  146*   K  2.9*  3.2*   CL  103  104   CO2  --  29   GLUCOSE  137*  80   BUN  86*  72*   CREATININE  5.10*  2.85*   CALCIUM  --  9.5   MG  --  2.3   PHOS  --  4.1    CBC:   Recent Labs   12/10/12 0716  12/10/12 1500   WBC  --  8.1   HGB  16.0  16.0   HCT  47.0  43.4   MCV  --  93.1   PLT  --  269    Urinalysis:   Recent Labs   12/10/12 0700   COLORURINE  YELLOW   LABSPEC  1.011   PHURINE  5.0   GLUCOSEU  NEGATIVE   HGBUR  LARGE*   BILIRUBINUR  NEGATIVE   KETONESUR  NEGATIVE   PROTEINUR  NEGATIVE   UROBILINOGEN  0.2   NITRITE  NEGATIVE   LEUKOCYTESUR  NEGATIVE     HOSPITAL COURSE: Acute renal failure and Acute urinary retention, baseline Cr 0.94  Mr. Carton presented with acute renal failure likely post renal in the setting of acute urinary retention (he has a history of BPH and BOO). His home medication of Lasix and ACEI certainly complicated his renal failure and these medications were held during admission.  2L or urine was drained via foley catheter and his creatinine improved from 5.10 to 2.85.  Renal ultrasound indicated mild right hydronephrosis and prostate enlargement. Dr. Annabell Howells (urology) was consulted at admission.  The patient felt better after catheter insertion and on hospital day 2 his creatinine had improved to 1.4 (baseline appears to be 0.94).  Mr. Ferns felt well and was stable for discharge home on hospital day 2.  The foley catheter was continued at  discharge and the patient has been scheduled to follow-up in [facility] on 12/15/12 and with Dr. Annabell Howells on 12/16/12.  HYPOKALEMIA  Patient denied muscular pain or weakness.   His K from ISTAT was 2.9 in the ED and was not replaced due to acute renal failure. K improved to 3.3 on hospital day 2 without intervention.  He received po prior to discharge.  He takes KDur daily at home.  His BMP will need to be checked at hospital follow-up.  Atrial fibrillation Baseline A fib on monitor at admission was controlled rate in the 90's.  Chart review revealed that Mr. Leeson had decline anticoagulation therapy in the past.  He had been started on Toprol XL 12.5mg  daily for rate control, however he was non-compliant with the medication.  Clinic notes indicate that his rate was well controlled at office visits.  In hospital, he was monitored on telemetry and his rates were ~100.  However, he did experience some episodes of asymptomatic tachy into the 170s both at rest and with movement.  This was in part thought to be due to alcohol withdrawal as the patient reported his last drink had been about 1 week ago.  He agreed to start metoprolol 12.5mg  BID at discharge.  He declined staying an additional night to monitor him on this medication.  He was instructed to go to the ED with any dizziness, weakness, SOB/CP or new symptoms.  He will follow-up on this issue at hospital follow-up on 12/15/12.   HYPERTENSION  Home meds include Lotensin, Lasix and Amlodipine.  Lotensin and Lasix were held in the setting of acute renal failure.  Amlodipine was continued and Hydralazine added.  He only received one dose of hydralazine (due to soft pressures) and his BP was stable with amlodipine.  BP medications can be reassessed at hospital follow-up since metoprolol has now been added as well.  CARCINOMA, BLADDER  The patient has a history of bladder carcinoma diagnosed in 2006.  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  He follows with Urologist, Dr. Annabell Howells and his last office visit was one year ago.  There was concern for recurrent bladder tumor  given hgb on UA and renal US finding of suspicious lesion noted within the bladder base.  Dr. Annabell Howells was consulted at admission and determined that the patient will need cystoscopy with TURBT and possible TURP.  Urology follow-up appointment is 12/16/2012 at 10:30AM.  Bilateral renal cysts  Two of the lesions did not meet criteria for simple cysts.  He will need further assessment with MRI as an outpatient.   Alcohol abuse  He reported his last drink was one week prior to hospitalization.  CIWA protocol was implemented during hospitalization.   DISCHARGE DATA: Vital Signs: BP 108/73  Pulse 83  Temp(Src) 97.5 F (36.4 C) (Oral)  Resp 18  Ht 6\' 1"  (1.854 m)  Wt 62.37 kg (137 lb 8 oz)  BMI 18.14 kg/m2  SpO2 100%  Labs: Results for orders placed during the hospital encounter of 12/10/12 (from the past 24 hour(s))  BASIC METABOLIC PANEL     Status: Abnormal   Collection Time    12/11/12  5:47 AM      Result Value Range   Sodium 144  135 - 145 mEq/L   Potassium 3.3 (*) 3.5 - 5.1 mEq/L   Chloride 104  96 - 112 mEq/L   CO2 29  19 - 32 mEq/L   Glucose, Bld 77  70 - 99 mg/dL   BUN 41 (*) 6 - 23 mg/dL   Creatinine, Ser 1.61 (*) 0.50 - 1.35 mg/dL   Calcium 9.0  8.4 - 09.6 mg/dL   GFR calc non Af Amer 48 (*) >90 mL/min   GFR calc Af Amer 56 (*) >90 mL/min  CBC     Status: None   Collection Time    12/11/12  5:47 AM      Result Value Range   WBC 6.6  4.0 - 10.5 K/uL   RBC 4.48  4.22 - 5.81 MIL/uL   Hemoglobin 15.0  13.0 - 17.0 g/dL   HCT 04.5  40.9 - 81.1 %   MCV 93.1  78.0 - 100.0 fL   MCH 33.5  26.0 - 34.0 pg   MCHC 36.0  30.0 - 36.0 g/dL   RDW 91.4  78.2 - 95.6 %   Platelets 260  150 - 400 K/uL     Services Ordered on Discharge: Y = Yes; Blank = No PT:   OT:   RN:   Equipment:   Other:      Time Spent on Discharge: 35 min   Signed: Evelena Peat PGY 1, Internal Medicine Resident 12/11/2012, 3:20 PM

## 2012-12-11 NOTE — Progress Notes (Signed)
12/11/2012 patient was heart rate was in the 140's to 160's non sustaining continuously. Md was aware and was notified. [facility] RN.

## 2012-12-11 NOTE — H&P (Signed)
  Date: 12/11/2012  Patient name: Tommy Morris  Medical record number: [id number]  Date of birth: 1940-10-03   I have seen and evaluated Tommy Morris and discussed their care with the Residency Team.  Tommy Morris is a 72yo man who presented with acute urinary retention and symptoms associated.  He was found to have AKI and a renal ultrasound revealed a concerning lesion in the bladder, and possibly in the kidney.   Assessment and Plan: I have seen and evaluated the patient as outlined above. I agree with the formulated Assessment and Plan as detailed in the residents' admission note, with the following changes:   1. Acute renal failure due to acute urinary retention - Foley catheter placed by ED, relieved urinary retention - Monitor BMET for resolution of AKI, if not improving, further work up will be needed - Hold renally cleared medications - Urology consulted  2. Hypokalemia - Monitor closely , use caution with replacing until Renal function improves  3. H/O bladder carcinoma - Suspicious lesions on imaging, Urology consulted  4. Renal lesions - Follow up MRI likely needed once discharged and acute issues resolved.   Other issues per resident note.   Inez Catalina, MD 11/20/20149:31 AM

## 2012-12-11 NOTE — Progress Notes (Addendum)
Subjective: Tommy Morris was seen and examined by me this morning.  He feels well and has an empty breakfast tray at bedside.    Objective: Vital signs in last 24 hours: Filed Vitals:   12/10/12 1347 12/10/12 2058 12/11/12 0500 12/11/12 0730  BP: 140/85 98/66 97/78  137/91  Pulse: 84 115 105 101  Temp: 98 F (36.7 C) 98 F (36.7 C) 97.8 F (36.6 C) 97.5 F (36.4 C)  TempSrc: Oral Oral Oral Oral  Resp: 20 18 18 18   Height:  6\' 1"  (1.854 m)    Weight:  62.37 kg (137 lb 8 oz)    SpO2: 100% 98% 100% 100%   Weight change:   Intake/Output Summary (Last 24 hours) at 12/11/12 0944 Last data filed at 12/11/12 0811  Gross per 24 hour  Intake    480 ml  Output   2850 ml  Net  -2370 ml   General: sitting up on side of bed with empty breakfast tray at bedside Cardiac: irregularly irregular, no rubs, murmurs or gallops Pulm: clear to auscultation bilaterally, moving normal volumes of air Abd: soft, nontender, nondistended, BS present Ext: warm and well perfused, no pedal edema Neuro: alert and oriented X3  Lab Results: Basic Metabolic Panel:  Recent Labs Lab 12/10/12 0716 12/10/12 1500 12/11/12 0547  NA 142 146* 144  K 2.9* 3.2* 3.3*  CL 103 104 104  CO2  --  29 29  GLUCOSE 137* 80 77  BUN 86* 72* 41*  CREATININE 5.10* 2.85* 1.40*  CALCIUM  --  9.5 9.0  MG  --  2.3  --   PHOS  --  4.1  --    Liver Function Tests:  Recent Labs Lab 12/10/12 1500  AST 49*  ALT 52  ALKPHOS 103  BILITOT 0.7  PROT 7.8  ALBUMIN 3.1*   CBC:  Recent Labs Lab 12/10/12 1500 12/11/12 0547  WBC 8.1 6.6  HGB 16.0 15.0  HCT 43.4 41.7  MCV 93.1 93.1  PLT 269 260   Coagulation:  Recent Labs Lab 12/10/12 1500  LABPROT 14.4  INR 1.14   Urine Drug Screen: Drugs of Abuse     Component Value Date/Time   LABOPIA POSITIVE* 04/25/2009 0034   COCAINSCRNUR NONE DETECTED 04/25/2009 0034   LABBENZ NONE DETECTED 04/25/2009 0034   AMPHETMU NONE DETECTED 04/25/2009 0034   THCU NONE DETECTED  04/25/2009 0034   LABBARB  Value: NONE DETECTED        DRUG SCREEN FOR MEDICAL PURPOSES ONLY.  IF CONFIRMATION IS NEEDED FOR ANY PURPOSE, NOTIFY LAB WITHIN 5 DAYS.        LOWEST DETECTABLE LIMITS FOR URINE DRUG SCREEN Drug Class       Cutoff (ng/mL) Amphetamine      1000 Barbiturate      200 Benzodiazepine   200 Tricyclics       300 Opiates          300 Cocaine          300 THC              50 04/25/2009 0034    Urinalysis:  Recent Labs Lab 12/10/12 0700  COLORURINE YELLOW  LABSPEC 1.011  PHURINE 5.0  GLUCOSEU NEGATIVE  HGBUR LARGE*  BILIRUBINUR NEGATIVE  KETONESUR NEGATIVE  PROTEINUR NEGATIVE  UROBILINOGEN 0.2  NITRITE NEGATIVE  LEUKOCYTESUR NEGATIVE   Studies/Results: US Renal  12/10/2012   CLINICAL DATA:  Urinary retention.  Hypertension.  EXAM: RENAL/URINARY TRACT ULTRASOUND COMPLETE  COMPARISON:  04/24/2009  FINDINGS: Right Kidney  Length: 12.1 cm. Normal echogenicity. There is mild hydronephrosis. Multiple cysts are noted within the right kidney. The largest is in the lower pole measuring 5.3 x 4.2 x 5.7 cm. Septation is noted stress set this dominant cyst is complicated by an internal area thin septation.  Left Kidney  Length: 13 cm. Normal echogenicity. Multiple cysts are noted. The largest is in the upper pole measuring 3.1 cm. Within the inferior pole there is a cyst measuring 1.6 x 1.4 x 1.9 cm. This cyst is hypoechoic and contains diffuse internal echoes.  Bladder  Thick walled urinary bladder is identified. Soft tissue filling defect is identified within the bladder base. There is blood flow identified within this defect which is suspicious for a mass. This measures 2.7 x 1.7 x 3.1 cm.  Other: The prostate gland is enlarged. The gland measures 5.1 x 4.7 x 5.6 cm.  IMPRESSION: 1. Prostate gland enlargement. There is bladder wall thickening which may reflect chronic bladder outlet obstruction. 2. Suspicious lesion is identified within the bladder base. Cannot rule out urothelial tumor.  Consider further evaluation with either contrast-enhanced hematuria protocol CT or direct visualization. 3. Bilateral renal cysts. At least two these cysts do not meet the criteria for simple cyst. Further assessment with contrast enhanced cross-sectional imaging is advised. The study of choice would be an MRI. 4. Mild right hydronephrosis.   Electronically Signed   By: Signa Kell M.D.   On: 12/10/2012 08:38   Portable Chest 1 View  12/10/2012   CLINICAL DATA:  History of hypertension and urinary retention  EXAM: PORTABLE CHEST - 1 VIEW  COMPARISON:  Chest CT 04/25/2009  FINDINGS: The heart size and mediastinal contours are within normal limits. Both lungs are clear. The visualized skeletal structures are unremarkable.  IMPRESSION: No active disease.   Electronically Signed   By: Salome Holmes M.D.   On: 12/10/2012 14:20   Medications: I have reviewed the patient's current medications. Scheduled Meds: . amLODipine  10 mg Oral Daily  . aspirin EC  81 mg Oral Daily  . folic acid  1 mg Oral Daily  . heparin  5,000 Units Subcutaneous Q8H  . hydrALAZINE  25 mg Oral Q8H  . multivitamin with minerals  1 tablet Oral Daily  . sodium chloride  3 mL Intravenous Q12H  . sodium chloride  3 mL Intravenous Q12H  . thiamine  100 mg Oral Daily   Or  . thiamine  100 mg Intravenous Daily   Continuous Infusions:  PRN Meds:.sodium chloride, LORazepam, LORazepam, sodium chloride  Assessment/Plan: Patient is a 72 year-old man with a PMH of bladder carcinoma diagnosed in 2006, A Fib not on anticoagulation therapy, HTN, HLD and alcohol abuse with last drink one week ago per patient, who presented with acute urinary retention.  #Acute renal failure and Acute urinary retention, baseline Cr 0.94  Patient presents with acute renal failure likely post renal in the setting of acute urinary retention. His home medication of Lasix and ACEI certainly complicated his renal failure.  Creatinine improving with foley,  5.10-->1.40.  No indication of prerenal or intrarenal precipitating factors. Renal ultrasound indicated mild right hydronephrosis and prostate enlargement. There is suspicious lesion noted within the bladder base.    Dr. Annabell Howells (urology) consulted and following.  Recommendations appreciated. - Continue foley catheter--likely need to discharge with foley and close follow up with Dr Annabell Howells.  - monitor and trend renal function closely  - hold  ACEI and lasix   # HYPOKALEMIA  - Patient denies muscular pain or weakness. Baseline A fib on monitor with controlled rate at 90's.  His K from ISTAT was 2.9 at ED and was not replaced due to concern of acute renal failure.  K improved to 3.3 this morning. - caution K replacement and close monitoring in the setting of acute renal failure   # CARCINOMA, BLADDER  - Hx of bladder carcinoma diagnosed in 2006. 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 Annabell Howells and last OV was one year ago.  Concern for recurrent bladder tumor given symptoms and renal US findings.  Dr. Annabell Howells is following.  He will need cystoscopy with TURBT and possible TURP.  # HYPERTENSION  Home meds include Lotensin, Lasix, Amlodipine.  - Holding Lotensin and lasix in the setting of renal failure.  - Continue Amlodipine  - Hydralazine 25 mg po TID added   # Atrial fibrillation-- not on anticoagulation therapy ( patient refused it)  - per chart review, he was started Toprol XL 12.5 mg po daily for rate control but has not taken it.  He has had rates just above 100 this morning with some spikes of 140-180 since admission.  He is asymptomatic during these events.  EtOH withdrawal may be contributing to his tachycardia. - Tele monitoring  - consider BB if tachy  #Bilateral renal cysts - two of the lesions don't meet criteria for simple cysts.  Will need further assessment with MRI as an outpatient.  # Alcohol abuse  states last drink one week ago.  - CIWA  - Sw  # VTE: Heparin  Dispo: The patient is stable for discharge today.  Spoke with Dr. Annabell Howells, who will see the patient in clinic on 12/16/2012.  The patient does have a current PCP Farley Ly, MD) and does need an [facility] hospital follow-up appointment after discharge.  The patient does not know have transportation limitations that hinder transportation to clinic appointments.  .Services Needed at time of discharge: Y = Yes, Blank = No PT:   OT:   RN:   Equipment:   Other:     LOS: 1 day   Evelena Peat, DO 12/11/2012, 9:44 AM

## 2012-12-15 ENCOUNTER — Ambulatory Visit: Payer: Medicare Other | Admitting: Internal Medicine

## 2012-12-15 NOTE — ED Provider Notes (Signed)
Medical screening examination/treatment/procedure(s) were performed by non-physician practitioner and as supervising physician I was immediately available for consultation/collaboration.  EKG Interpretation    Date/Time:  Wednesday December 10 2012 07:45:05 EST Ventricular Rate:  96 PR Interval:    QRS Duration: 91 QT Interval:  342 QTC Calculation: 432 R Axis:   87 Text Interpretation:  Atrial fibrillation Borderline right axis deviation Anteroseptal infarct, old Borderline T abnormalities, inferior leads ED PHYSICIAN INTERPRETATION AVAILABLE IN [facility] Confirmed by TEST, RECORD ([id number]) on 12/12/2012 9:12:12 AM             Juliet Rude. Rubin Payor, MD 12/15/12 [phone]

## 2012-12-16 ENCOUNTER — Other Ambulatory Visit: Payer: Self-pay | Admitting: Urology

## 2012-12-16 NOTE — Discharge Summary (Signed)
I saw Tommy Morris on day of discharge and assisted in the discharge planning.

## 2012-12-17 MED ORDER — MITOMYCIN CHEMO FOR BLADDER INSTILLATION 40 MG: 40.0000 mg | Freq: Once | INTRAVENOUS | Status: DC

## 2012-12-24 ENCOUNTER — Encounter (HOSPITAL_COMMUNITY): Payer: Self-pay | Admitting: Emergency Medicine

## 2012-12-24 ENCOUNTER — Emergency Department (HOSPITAL_COMMUNITY)
Admission: EM | Admit: 2012-12-24 | Discharge: 2012-12-24 | Disposition: A | Discharge status: Home / Self Care | Payer: Medicare Other | Attending: Emergency Medicine | Admitting: Emergency Medicine

## 2012-12-24 DIAGNOSIS — Z8639 Personal history of other endocrine, nutritional and metabolic disease: Secondary | ICD-10-CM | POA: Insufficient documentation

## 2012-12-24 DIAGNOSIS — R600 Localized edema: Secondary | ICD-10-CM

## 2012-12-24 DIAGNOSIS — R609 Edema, unspecified: Secondary | ICD-10-CM | POA: Insufficient documentation

## 2012-12-24 DIAGNOSIS — Z862 Personal history of diseases of the blood and blood-forming organs and certain disorders involving the immune mechanism: Secondary | ICD-10-CM | POA: Insufficient documentation

## 2012-12-24 DIAGNOSIS — Z87891 Personal history of nicotine dependence: Secondary | ICD-10-CM | POA: Insufficient documentation

## 2012-12-24 DIAGNOSIS — Z87448 Personal history of other diseases of urinary system: Secondary | ICD-10-CM | POA: Insufficient documentation

## 2012-12-24 DIAGNOSIS — Z7982 Long term (current) use of aspirin: Secondary | ICD-10-CM | POA: Insufficient documentation

## 2012-12-24 DIAGNOSIS — Z8669 Personal history of other diseases of the nervous system and sense organs: Secondary | ICD-10-CM | POA: Insufficient documentation

## 2012-12-24 DIAGNOSIS — Z8551 Personal history of malignant neoplasm of bladder: Secondary | ICD-10-CM | POA: Insufficient documentation

## 2012-12-24 DIAGNOSIS — M7989 Other specified soft tissue disorders: Secondary | ICD-10-CM

## 2012-12-24 DIAGNOSIS — Z8709 Personal history of other diseases of the respiratory system: Secondary | ICD-10-CM | POA: Insufficient documentation

## 2012-12-24 DIAGNOSIS — I10 Essential (primary) hypertension: Secondary | ICD-10-CM | POA: Insufficient documentation

## 2012-12-24 DIAGNOSIS — M79609 Pain in unspecified limb: Secondary | ICD-10-CM

## 2012-12-24 DIAGNOSIS — Z79899 Other long term (current) drug therapy: Secondary | ICD-10-CM | POA: Insufficient documentation

## 2012-12-24 DIAGNOSIS — Z8719 Personal history of other diseases of the digestive system: Secondary | ICD-10-CM | POA: Insufficient documentation

## 2012-12-24 LAB — CBC WITH DIFFERENTIAL/PLATELET
Basophils Relative: 1 % (ref 0–1)
Eosinophils Relative: 7 % — ABNORMAL HIGH (ref 0–5)
HCT: 35.8 % — ABNORMAL LOW (ref 39.0–52.0)
Hemoglobin: 12.6 g/dL — ABNORMAL LOW (ref 13.0–17.0)
Lymphocytes Relative: 21 % (ref 12–46)
Lymphs Abs: 1.3 10*3/uL (ref 0.7–4.0)
MCHC: 35.2 g/dL (ref 30.0–36.0)
Monocytes Absolute: 0.5 10*3/uL (ref 0.1–1.0)
Monocytes Relative: 8 % (ref 3–12)
Neutro Abs: 4 10*3/uL (ref 1.7–7.7)
RBC: 3.87 MIL/uL — ABNORMAL LOW (ref 4.22–5.81)

## 2012-12-24 LAB — COMPREHENSIVE METABOLIC PANEL
Alkaline Phosphatase: 98 U/L (ref 39–117)
BUN: 10 mg/dL (ref 6–23)
CO2: 30 mEq/L (ref 19–32)
Calcium: 8.7 mg/dL (ref 8.4–10.5)
Chloride: 102 mEq/L (ref 96–112)
Creatinine, Ser: 0.89 mg/dL (ref 0.50–1.35)
GFR calc Af Amer: >90 mL/min (ref >90)
GFR calc non Af Amer: 82 mL/min — ABNORMAL LOW (ref >90)
Glucose, Bld: 94 mg/dL (ref 70–99)
Potassium: 3.2 mEq/L — ABNORMAL LOW (ref 3.5–5.1)
Total Bilirubin: 0.2 mg/dL — ABNORMAL LOW (ref 0.3–1.2)

## 2012-12-24 LAB — D-DIMER, QUANTITATIVE (NOT AT ARMC): D-Dimer, Quant: 1.54 ug/mL-FEU — ABNORMAL HIGH (ref 0.00–0.48)

## 2012-12-24 LAB — PROTIME-INR
INR: 1.08 (ref 0.00–1.49)
Prothrombin Time: 13.8 seconds (ref 11.6–15.2)

## 2012-12-24 NOTE — ED Notes (Signed)
Discharge instructions reviewed with pt. Pt verbalized understanding.   

## 2012-12-24 NOTE — ED Provider Notes (Signed)
CSN: [id number]     Arrival date & time 12/24/12  5284 History   First MD Initiated Contact with Patient 12/24/12 1115     Chief Complaint  Patient presents with  . Leg Pain   (Consider location/radiation/quality/duration/timing/severity/associated sxs/prior Treatment) HPI Comments: Patient is a 72 year old male presents with complaints of pain and swelling in the left calf. He states that this has been present for approximately one week. He was admitted here for urinary retention shortly before the symptoms began. He is concerned the medications I prescribed for him are causing this. He denies any chest pain any shortness of breath. He denies any fevers or chills.  Patient is a 72 y.o. male presenting with leg pain.  Leg Pain Lower extremity pain location: Lower leg and calf. Time since incident:  1 week Injury: no   Pain details:    Quality:  Sharp   Radiates to:  Does not radiate   Severity:  Moderate   Onset quality:  Gradual   Duration:  1 week   Timing:  Constant   Progression:  Worsening Chronicity:  New Dislocation: no   Prior injury to area:  No Relieved by:  Nothing Worsened by:  Activity (Walking)   Past Medical History  Diagnosis Date  . Atrial fibrillation 04/27/2009    2-D echocardiogram 05/03/2009 showed mildly to moderately reduced LV systolic function with estimated ejection fraction of 40% to 45%; diffuse hypokinesis; trivial aortic regurgitation; mild mitral valve prolapse; mild to moderate mitral regurgitation; mildly dilated right and left atria.  Patient declined anticoagulation for stroke prophylaxis.  [name] Hypertension   . Hypercholesteremia   . 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  . Gout   . Diverticulosis of colon    . Tinnitus   . Pulmonary nodule     Two small right lung nodules on chest CT scan 04/25/2009; follow-up CT advised in 6-12 months.  . Normocytic anemia   . Heme positive stool     Noted during hospitalization with acute urinary retention in April 2011; referral was advised for a colonoscopy but patient declined  . Acute urinary retention 04/24/2009    Hospitalized 04/24/2009 with acute urinary retention and Cr=4.78; creatinine normalized by 04/27/2009 following Foley catheter placement; patient followed up with Dr. Annabell Howells, and the Foley catheter was removed without problems.  . Acute renal failure     Hospitalized 04/24/2009 with acute urinary retention and Cr=4.78; creatinine normalized by 04/27/2009 following Foley catheter placement; patient followed up with Dr. Annabell Howells.  . Alcohol abuse   . 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 Elevated PSA    Past Surgical History  Procedure Laterality Date  . Transurethral resection of bladder tumor  10/26/2004    S/P transrectal ultrasound-guided biopsy of prostate, cystourethroscopy, and TURBT on 10/26/2004; pathology of bladder tumor showed papillary urothelial carcinoma with no invasion identified; needle biopsies of prostate showed benign prostate tissue.  . Prostate biopsy    . Cystoscopy     Family History  Problem Relation Age of Onset  . Alzheimer's disease Mother     Died in 80 at age 68.  . Cancer Neg Hx   . Diabetes Neg Hx   . Hypertension Neg Hx   . Heart disease Neg Hx    History  Substance Use  Topics  . Smoking status: Former Smoker -- 1.00 packs/day for 50 years    Quit date: 01/22/2002  . Smokeless tobacco: Former Neurosurgeon    Types: Chew    Quit date: 01/23/1964     Comment: Used for less than 1 year, quit over 40 years ago.  . Alcohol Use: 0.0 oz/week     Comment: Average of about 3 drinks per day.    Review of Systems  All other systems reviewed and are negative.    Allergies  Review of  patient's allergies indicates no known allergies.  Home Medications   Current Outpatient Rx  Name  Route  Sig  Dispense  Refill  . amLODipine (NORVASC) 5 MG tablet   Oral   Take 1 tablet (5 mg total) by mouth daily.   30 tablet   11   . aspirin 81 MG EC tablet   Oral   Take 1 tablet (81 mg total) by mouth daily.   100 tablet   3   . benazepril (LOTENSIN) 40 MG tablet   Oral   Take 1 tablet (40 mg total) by mouth daily.   30 tablet   11   . furosemide (LASIX) 40 MG tablet   Oral   Take 1 tablet (40 mg total) by mouth daily.   30 tablet   11   . metoprolol tartrate (LOPRESSOR) 25 MG tablet   Oral   Take 0.5 tablets (12.5 mg total) by mouth 2 (two) times daily.   60 tablet   1   . potassium chloride (K-DUR) 10 MEQ tablet   Oral   Take 1 tablet (10 mEq total) by mouth daily.   30 tablet   6    BP 150/118  Pulse 54  Temp(Src) 97.9 F (36.6 C) (Oral)  Resp 19  SpO2 97% Physical Exam  Nursing note and vitals reviewed. Constitutional: He is oriented to person, place, and time. He appears well-developed and well-nourished. No distress.  HENT:  Head: Normocephalic and atraumatic.  Neck: Normal range of motion. Neck supple.  Cardiovascular: Normal rate, regular rhythm and normal heart sounds.   No murmur heard. Pulmonary/Chest: Effort normal and breath sounds normal. No respiratory distress. He has no wheezes.  Abdominal: Soft. Bowel sounds are normal.  Musculoskeletal: Normal range of motion.  The left lower extremity is noted to have 2-3+ pitting edema. There is no redness or erythema. He denies calf tenderness and Homans sign is absent. There are no palpable cords in the popliteal fossa.  Neurological: He is alert and oriented to person, place, and time.  Skin: Skin is warm and dry. He is not diaphoretic.    ED Course  Procedures (including critical care time) Labs Review Labs Reviewed - No data to display Imaging Review No results found.    MDM  No  diagnosis found. Patient is a 72 year old male with history of atrial fibrillation and hypertension. He presents today with complaints of swelling of his left leg. He also has swelling of his right leg but to a lesser degree. He was recently admitted to the hospital and had a Foley placed which remains indwelling. Workup today reveals a negative DVT study, and essentially unremarkable laboratory studies. I am uncertain as to the etiology of his leg swelling, however I will recommend he increase his Lasix for the next several days and follow up with his primary Dr. to discuss. This does not appear to be cellulitis as there is no warmth or erythema. To  return as needed if he develops other problems or if his condition worsens.    Geoffery Lyons, MD 12/24/12 [phone]

## 2012-12-24 NOTE — ED Notes (Signed)
Pt. Was here on the 19th for urinary retention.   Pt. Reports after he went home he began to have abdominal swelling and lt. Calf swelling. Pt. Denies any n/v  Lt. Calf pain increases when pt. Walks.

## 2012-12-24 NOTE — Progress Notes (Signed)
*  PRELIMINARY RESULTS* Vascular Ultrasound Left lower extremity venous duplex has been completed.  Preliminary findings: no evidence of DVT.   Farrel Demark, RDMS, RVT  12/24/2012, 12:32 PM

## 2012-12-24 NOTE — ED Notes (Signed)
Pt c/o left leg swelling that he states started after taking some new medications. Pt states walking makes the pain worse.

## 2012-12-30 ENCOUNTER — Encounter: Payer: Self-pay | Admitting: Internal Medicine

## 2013-01-01 ENCOUNTER — Emergency Department (HOSPITAL_COMMUNITY)
Admission: EM | Admit: 2013-01-01 | Discharge: 2013-01-01 | Disposition: A | Discharge status: Home / Self Care | Payer: Medicare Other | Attending: Emergency Medicine | Admitting: Emergency Medicine

## 2013-01-01 ENCOUNTER — Emergency Department (HOSPITAL_COMMUNITY): Payer: Medicare Other

## 2013-01-01 ENCOUNTER — Encounter (HOSPITAL_COMMUNITY): Payer: Self-pay | Admitting: Emergency Medicine

## 2013-01-01 DIAGNOSIS — Z862 Personal history of diseases of the blood and blood-forming organs and certain disorders involving the immune mechanism: Secondary | ICD-10-CM | POA: Insufficient documentation

## 2013-01-01 DIAGNOSIS — Z8551 Personal history of malignant neoplasm of bladder: Secondary | ICD-10-CM | POA: Insufficient documentation

## 2013-01-01 DIAGNOSIS — M1710 Unilateral primary osteoarthritis, unspecified knee: Secondary | ICD-10-CM | POA: Insufficient documentation

## 2013-01-01 DIAGNOSIS — Z87448 Personal history of other diseases of urinary system: Secondary | ICD-10-CM | POA: Insufficient documentation

## 2013-01-01 DIAGNOSIS — M1990 Unspecified osteoarthritis, unspecified site: Secondary | ICD-10-CM

## 2013-01-01 DIAGNOSIS — Z8639 Personal history of other endocrine, nutritional and metabolic disease: Secondary | ICD-10-CM | POA: Insufficient documentation

## 2013-01-01 DIAGNOSIS — M79609 Pain in unspecified limb: Secondary | ICD-10-CM | POA: Insufficient documentation

## 2013-01-01 DIAGNOSIS — Z8669 Personal history of other diseases of the nervous system and sense organs: Secondary | ICD-10-CM | POA: Insufficient documentation

## 2013-01-01 DIAGNOSIS — Z8719 Personal history of other diseases of the digestive system: Secondary | ICD-10-CM | POA: Insufficient documentation

## 2013-01-01 DIAGNOSIS — Z7982 Long term (current) use of aspirin: Secondary | ICD-10-CM | POA: Insufficient documentation

## 2013-01-01 DIAGNOSIS — R000 Tachycardia, unspecified: Secondary | ICD-10-CM | POA: Insufficient documentation

## 2013-01-01 DIAGNOSIS — Z87891 Personal history of nicotine dependence: Secondary | ICD-10-CM | POA: Insufficient documentation

## 2013-01-01 DIAGNOSIS — I10 Essential (primary) hypertension: Secondary | ICD-10-CM | POA: Insufficient documentation

## 2013-01-01 DIAGNOSIS — I4891 Unspecified atrial fibrillation: Secondary | ICD-10-CM | POA: Insufficient documentation

## 2013-01-01 DIAGNOSIS — Z79899 Other long term (current) drug therapy: Secondary | ICD-10-CM | POA: Insufficient documentation

## 2013-01-01 LAB — CBC WITH DIFFERENTIAL/PLATELET
Basophils Relative: 0 % (ref 0–1)
Eosinophils Absolute: 0.2 10*3/uL (ref 0.0–0.7)
Hemoglobin: 13.2 g/dL (ref 13.0–17.0)
Lymphocytes Relative: 25 % (ref 12–46)
Lymphs Abs: 1.2 10*3/uL (ref 0.7–4.0)
MCH: 32.2 pg (ref 26.0–34.0)
MCHC: 34.6 g/dL (ref 30.0–36.0)
Monocytes Relative: 11 % (ref 3–12)
Neutro Abs: 3 10*3/uL (ref 1.7–7.7)
Neutrophils Relative %: 61 % (ref 43–77)
Platelets: 270 10*3/uL (ref 150–400)
RBC: 4.1 MIL/uL — ABNORMAL LOW (ref 4.22–5.81)
RDW: 13 % (ref 11.5–15.5)

## 2013-01-01 LAB — COMPREHENSIVE METABOLIC PANEL
ALT: 12 U/L (ref 0–53)
CO2: 25 mEq/L (ref 19–32)
Calcium: 9.1 mg/dL (ref 8.4–10.5)
Chloride: 100 mEq/L (ref 96–112)
Creatinine, Ser: 0.84 mg/dL (ref 0.50–1.35)
GFR calc Af Amer: >90 mL/min (ref >90)
GFR calc non Af Amer: 84 mL/min — ABNORMAL LOW (ref >90)
Glucose, Bld: 84 mg/dL (ref 70–99)
Total Bilirubin: 0.4 mg/dL (ref 0.3–1.2)
Total Protein: 7.3 g/dL (ref 6.0–8.3)

## 2013-01-01 LAB — ETHANOL: Alcohol, Ethyl (B): <11 mg/dL (ref 0–11)

## 2013-01-01 MED ORDER — HYDROCODONE-ACETAMINOPHEN 5-325 MG PO TABS: 1.0000 | ORAL_TABLET | Freq: Once | ORAL | Status: DC

## 2013-01-01 MED ORDER — METOPROLOL TARTRATE 25 MG PO TABS
25.0000 mg | ORAL_TABLET | Freq: Once | ORAL | Status: AC
  Administered 2013-01-01: 25 mg via ORAL
  Filled 2013-01-01: qty 1

## 2013-01-01 MED ORDER — HYDROCODONE-ACETAMINOPHEN 5-325 MG PO TABS
2.0000 | ORAL_TABLET | Freq: Once | ORAL | Status: AC
  Administered 2013-01-01: 2 via ORAL
  Filled 2013-01-01: qty 2

## 2013-01-01 MED ORDER — SODIUM CHLORIDE 0.9 % IV BOLUS (SEPSIS): 500.0000 mL | Freq: Once | INTRAVENOUS | Status: DC

## 2013-01-01 NOTE — ED Provider Notes (Signed)
CSN: [id number]     Arrival date & time 01/01/13  1027 History   First MD Initiated Contact with Patient 01/01/13 1111     Chief Complaint  Patient presents with  . Leg Pain   (Consider location/radiation/quality/duration/timing/severity/associated sxs/prior Treatment) Patient is a 72 y.o. male presenting with leg pain. The history is provided by the patient. No language interpreter was used.  Leg Pain Location:  Knee Knee location:  L knee Chronicity:  New Dislocation: no   Associated symptoms: no fever   Associated symptoms comment:  He complains of pain in the left knee, ankle and distal foot joints. No calf or thigh pain. No injury. He has had same in the past. He denies injury, fever or significant swelling.   Past Medical History  Diagnosis Date  . Atrial fibrillation 04/27/2009    2-D echocardiogram 05/03/2009 showed mildly to moderately reduced LV systolic function with estimated ejection fraction of 40% to 45%; diffuse hypokinesis; trivial aortic regurgitation; mild mitral valve prolapse; mild to moderate mitral regurgitation; mildly dilated right and left atria.  Patient declined anticoagulation for stroke prophylaxis.  [name] Hypertension   . Hypercholesteremia   . 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  . Gout   . Diverticulosis of colon   . Tinnitus   . Pulmonary nodule     Two small right lung nodules on chest CT scan 04/25/2009; follow-up CT advised in 6-12 months.  . Normocytic anemia   . Heme positive stool     Noted during hospitalization with acute urinary retention in April 2011; referral was advised for a colonoscopy but patient declined  . Acute urinary retention 04/24/2009    Hospitalized 04/24/2009 with acute urinary retention  and Cr=4.78; creatinine normalized by 04/27/2009 following Foley catheter placement; patient followed up with Dr. Annabell Howells, and the Foley catheter was removed without problems.  . Acute renal failure     Hospitalized 04/24/2009 with acute urinary retention and Cr=4.78; creatinine normalized by 04/27/2009 following Foley catheter placement; patient followed up with Dr. Annabell Howells.  . Alcohol abuse   . 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 Elevated PSA    Past Surgical History  Procedure Laterality Date  . Transurethral resection of bladder tumor  10/26/2004    S/P transrectal ultrasound-guided biopsy of prostate, cystourethroscopy, and TURBT on 10/26/2004; pathology of bladder tumor showed papillary urothelial carcinoma with no invasion identified; needle biopsies of prostate showed benign prostate tissue.  . Prostate biopsy    . Cystoscopy     Family History  Problem Relation Age of Onset  . Alzheimer's disease Mother     Died in 16 at age 55.  . Cancer Neg Hx   . Diabetes Neg Hx   . Hypertension Neg Hx   . Heart disease Neg Hx    History  Substance Use Topics  . Smoking status: Former Smoker -- 1.00 packs/day for 50 years    Quit date: 01/22/2002  . Smokeless tobacco: Former Neurosurgeon    Types: Chew    Quit date: 01/23/1964     Comment: Used for less than 1 year, quit over 40 years ago.  . Alcohol Use: 0.0 oz/week     Comment: Average of about 3 drinks per day.    Review of Systems  Constitutional: Negative for fever  and chills.  Respiratory: Negative.  Negative for shortness of breath.   Cardiovascular: Negative.   Musculoskeletal: Positive for arthralgias. Negative for joint swelling and myalgias.       See HPI  Skin: Negative for color change.  Neurological: Negative.  Negative for numbness.    Allergies  Review of patient's allergies indicates no known allergies.  Home Medications   Current Outpatient Rx  Name  Route  Sig  Dispense  Refill  .  amLODipine (NORVASC) 5 MG tablet   Oral   Take 1 tablet (5 mg total) by mouth daily.   30 tablet   11   . aspirin 81 MG EC tablet   Oral   Take 1 tablet (81 mg total) by mouth daily.   100 tablet   3   . benazepril (LOTENSIN) 40 MG tablet   Oral   Take 1 tablet (40 mg total) by mouth daily.   30 tablet   11   . furosemide (LASIX) 40 MG tablet   Oral   Take 1 tablet (40 mg total) by mouth daily.   30 tablet   11   . metoprolol tartrate (LOPRESSOR) 25 MG tablet   Oral   Take 0.5 tablets (12.5 mg total) by mouth 2 (two) times daily.   60 tablet   1   . Polyvinyl Alcohol-Povidone (REFRESH OP)   Ophthalmic   Apply 1 drop to eye daily as needed (for dry eyes).          BP 141/90  Pulse 78  Temp(Src) 98.3 F (36.8 C) (Oral)  Resp 26  SpO2 97% Physical Exam  Constitutional: He is oriented to person, place, and time. He appears well-developed and well-nourished.  HENT:  Head: Normocephalic.  Neck: Normal range of motion. Neck supple.  Cardiovascular: Regular rhythm and intact distal pulses.  Tachycardia present.   Pulmonary/Chest: Effort normal and breath sounds normal.  Abdominal: Soft. Bowel sounds are normal. There is no tenderness. There is no rebound and no guarding.  Musculoskeletal: Normal range of motion.  No significant swelling of left lower extremity. No calf or thigh tenderness. FROM. Mild tenderness of circumferential knee and ankle. No redness or warmth.  Neurological: He is alert and oriented to person, place, and time.  Skin: Skin is warm and dry. No rash noted.  Psychiatric: He has a normal mood and affect.    ED Course  Procedures (including critical care time) Labs Review Labs Reviewed  TROPONIN I  COMPREHENSIVE METABOLIC PANEL  CBC WITH DIFFERENTIAL  ETHANOL   Imaging Review No results found.  EKG Interpretation    Date/Time:  Thursday January 01 2013 11:34:06 EST Ventricular Rate:  110 PR Interval:    QRS Duration: 93 QT  Interval:  342 QTC Calculation: 463 R Axis:   83 Text Interpretation:  Atrial fibrillation Ventricular premature complex Anterior infarct, old Borderline T abnormalities, inferior leads since last tracing no significant change Confirmed by Effie Shy  MD, ELLIOTT ([id number]) on 01/01/2013 12:08:24 PM            MDM  No diagnosis found.    Heart rate significant elevated with a history of A-fib. Per chart review, the patient has refused anti-coagulation and has been non-compliant with blood pressure medications in the past. Today he refuses IV for rate control. Tolerating PO fluids. Heart rate improved to 102, pain in lower extremities improved after Norco. Stable for discharge.     Arnoldo Hooker, PA-C 01/01/13 1346

## 2013-01-01 NOTE — ED Notes (Signed)
Foley catheter in place on arrival

## 2013-01-01 NOTE — ED Notes (Signed)
Pt states has been here multiple times for same leg pain and today his leg hurts again

## 2013-01-01 NOTE — ED Provider Notes (Signed)
  Face-to-face evaluation   History: He arrives for evaluation of left leg pain. He feels like the pain is worse since he was treated here 12/10/2012. He is concerned that he was given a medication that caused the pain and is also concerned that he is more swollen since then. He, states he did not take his metoprolol this morning.  Physical exam: Alert, calm, cooperative. Heart regular tachycardia, varying from 110-135, while I was in the room. Left leg has swelling and tenderness of the knee or leg and ankle. Swelling of the left lower leg is 1+. He resists motion of the left knee and left ankle, secondary to pain at these joints.  Medical screening examination/treatment/procedure(s) were conducted as a shared visit with non-physician practitioner(s) and myself.  I personally evaluated the patient during the encounter  Flint Melter, MD 01/01/13 2011

## 2013-01-01 NOTE — ED Notes (Signed)
Pt refuses IV, MD and PA updated, Dr Effie Shy advises to offer pt oral hydration, 3 cups of water given to pt

## 2013-01-02 ENCOUNTER — Encounter (HOSPITAL_BASED_OUTPATIENT_CLINIC_OR_DEPARTMENT_OTHER): Payer: Self-pay | Admitting: *Deleted

## 2013-01-02 NOTE — Progress Notes (Addendum)
NPO AFTER MN. ARRIVE AT 9604. NEEDS ISTAT. CURRENT CXR AND EKG IN EPIC AND CHART. WILL TAKE NORVASC AND LOPRESSOR AM DOS W/ SIPS OF WATER AND WILL DO FLEET ENEMA.  PT HAS NO CAREGIVER,  WILL BE OWER, PT AWARE OWER AT MAIN .  MEASURE NECK.

## 2013-01-05 NOTE — H&P (Signed)
Reason For Visit  Mr. Maxcy presents today for a voiding trial   History of Present Illness           Mr. Lesniak  has a long history of  BPH with BOO with prior retention and ARI in the past.  This episode was precipitated by drinking at a card game.  He had been on Jalyn in the past but has had nothing in a long time.  Dr. Annabell Howells saw him last week.  He had been in the hospital for retention.  His PVR was 2000cc and his Cr was over 5.  The renal function improved rapidly and his cr was 1.4 on 11/20.  A bladder US showed a possible bladder tumor.  He has had prior bladder cancer with the last resection in 2006 .He had a negative prostate biopsy in 2005.  A renal US showed bilateral renal cysts some with  complexity and an MRI was recommended.   Dr. Annabell Howells performed cystoscopy which confirmed need for a TURBT.  He was unable to void prior to cysto.  A catheter was replaced.  He was started on Rapaflo. He is scheduled for a cysto, retrograde pyelogram, TURBT, possible stent and possible MMC on 12/16.  Interval history: Mr. Caldron presents today for a voiding trial.  He is doing well today.  His catheter has been draining well.  He denies any catheter related pain.  He has not been taking Rapaflo regularly as he was confused about taking it while he had a catheter indwelling. He denies n/v or f/c.   Past Medical History Problems   1. History of Gout (274.9)  2. History of bladder cancer (V10.51)  3. History of hypertension (V12.59)  4. History of Urinary Tract Infection (V13.02)  Surgical History Problems   1. History of Biopsy Of The Prostate Needle  2. History of Cystoscopy With Fulguration Trigone  Current Meds  1. AmLODIPine Besylate 5 MG Oral Tablet;  Therapy: 09Jan2013 to Recorded  2. Aspirin 81 MG Oral Tablet;  Therapy: (Recorded:26Aug2013) to Recorded  3. Benazepril HCl - 40 MG Oral Tablet;  Therapy: (Recorded:09May2011) to Recorded  4. Furosemide 40 MG Oral Tablet;  Therapy:  (Recorded:09May2011) to Recorded  5. Metoprolol Succinate ER 25 MG Oral Tablet Extended Release 24 Hour;  Therapy: 06Apr2011 to Recorded  Allergies Medication   1. No Known Drug Allergies  Family History Problems   1. No pertinent family history : Sibling  2. Denied: Family history of Prostate Cancer  Social History Problems   1. Alcohol Use   2/day  2. Caffeine Use   1/day  3. Former smoker (V15.82)  4. Marital History - Widowed  5. 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  6. Retired  Review of Systems Genitourinary, constitutional and gastrointestinal system(s) were reviewed and pertinent findings if present are noted.    Physical Exam Constitutional: Well nourished and well developed . No acute distress.  Pulmonary: No respiratory distress and normal respiratory rhythm and effort.  Abdomen: The abdomen is soft and nontender.    Results/Data  Flow Rate: Instilled volume 320 ml .    Procedure  320 cc of sterile water were instilled into Mr. Geesey's bladder.  His catheter was removed.  He was unable to void.   Assessment Assessed   1. Urinary retention (788.20)   Mr. Spratt opted to try to proceed with a voiding trial despite not taking Rapaflo regularly.  His voiding trial was not successful.  A new 16  fr foley was inserted using sterile technique by the nursing staff.  10 cc of sterile water were used to inflate the balloon.  320 cc of fluid were drained.  The catheter was attached to bag.  He tolerated this well.  He will resume Rapaflo.  He now understands why he would take this even with a catheter.  This hopefully will help increase his chance of being able to void after his surgery. He was also given a Cipro to cover him for the procedure.  He will leave his catheter indwelling until after his TURBT.   Plan  1. 1 Cipro 500 mg today 2. Resume Rapaflo 3. Follow up as scheduled for surgery

## 2013-01-06 ENCOUNTER — Encounter (HOSPITAL_BASED_OUTPATIENT_CLINIC_OR_DEPARTMENT_OTHER): Payer: Self-pay | Admitting: Anesthesiology

## 2013-01-06 ENCOUNTER — Encounter (HOSPITAL_COMMUNITY)
Admission: RE | Disposition: A | Discharge status: Home / Self Care | Payer: Self-pay | Source: Ambulatory Visit | Attending: Urology

## 2013-01-06 ENCOUNTER — Observation Stay (HOSPITAL_BASED_OUTPATIENT_CLINIC_OR_DEPARTMENT_OTHER)
Admission: RE | Admit: 2013-01-06 | Discharge: 2013-01-07 | Disposition: A | Discharge status: Home / Self Care | Payer: Medicare Other | Source: Ambulatory Visit | Attending: Urology | Admitting: Urology

## 2013-01-06 ENCOUNTER — Encounter (HOSPITAL_BASED_OUTPATIENT_CLINIC_OR_DEPARTMENT_OTHER): Payer: Medicare Other | Admitting: Anesthesiology

## 2013-01-06 ENCOUNTER — Ambulatory Visit (HOSPITAL_BASED_OUTPATIENT_CLINIC_OR_DEPARTMENT_OTHER): Payer: Medicare Other | Admitting: Anesthesiology

## 2013-01-06 DIAGNOSIS — N411 Chronic prostatitis: Secondary | ICD-10-CM | POA: Insufficient documentation

## 2013-01-06 DIAGNOSIS — R339 Retention of urine, unspecified: Secondary | ICD-10-CM | POA: Insufficient documentation

## 2013-01-06 DIAGNOSIS — M109 Gout, unspecified: Secondary | ICD-10-CM | POA: Insufficient documentation

## 2013-01-06 DIAGNOSIS — I10 Essential (primary) hypertension: Secondary | ICD-10-CM | POA: Insufficient documentation

## 2013-01-06 DIAGNOSIS — C675 Malignant neoplasm of bladder neck: Secondary | ICD-10-CM | POA: Insufficient documentation

## 2013-01-06 DIAGNOSIS — N281 Cyst of kidney, acquired: Secondary | ICD-10-CM | POA: Insufficient documentation

## 2013-01-06 DIAGNOSIS — C679 Malignant neoplasm of bladder, unspecified: Secondary | ICD-10-CM | POA: Diagnosis present

## 2013-01-06 DIAGNOSIS — N138 Other obstructive and reflux uropathy: Principal | ICD-10-CM | POA: Insufficient documentation

## 2013-01-06 DIAGNOSIS — R338 Other retention of urine: Secondary | ICD-10-CM | POA: Diagnosis present

## 2013-01-06 DIAGNOSIS — Z87891 Personal history of nicotine dependence: Secondary | ICD-10-CM | POA: Insufficient documentation

## 2013-01-06 DIAGNOSIS — Z79899 Other long term (current) drug therapy: Secondary | ICD-10-CM | POA: Insufficient documentation

## 2013-01-06 DIAGNOSIS — C674 Malignant neoplasm of posterior wall of bladder: Secondary | ICD-10-CM | POA: Insufficient documentation

## 2013-01-06 DIAGNOSIS — N401 Enlarged prostate with lower urinary tract symptoms: Secondary | ICD-10-CM | POA: Insufficient documentation

## 2013-01-06 DIAGNOSIS — Z7982 Long term (current) use of aspirin: Secondary | ICD-10-CM | POA: Insufficient documentation

## 2013-01-06 HISTORY — DX: Nonrheumatic mitral (valve) prolapse: I34.1

## 2013-01-06 HISTORY — DX: Personal history of malignant neoplasm of bladder: Z85.51

## 2013-01-06 HISTORY — DX: Personal history of other specified conditions: Z87.898

## 2013-01-06 HISTORY — DX: Osteoarthritis of knee, unspecified: M17.9

## 2013-01-06 HISTORY — PX: CYSTOSCOPY WITH RETROGRADE PYELOGRAM, URETEROSCOPY AND STENT PLACEMENT: SHX5789

## 2013-01-06 HISTORY — DX: Benign prostatic hyperplasia with lower urinary tract symptoms: N13.8

## 2013-01-06 HISTORY — DX: Unilateral primary osteoarthritis, unspecified knee: M17.10

## 2013-01-06 HISTORY — DX: Nonrheumatic mitral (valve) insufficiency: I34.0

## 2013-01-06 HISTORY — DX: Benign prostatic hyperplasia with lower urinary tract symptoms: N40.1

## 2013-01-06 HISTORY — PX: TRANSURETHRAL RESECTION OF BLADDER TUMOR WITH GYRUS (TURBT-GYRUS): SHX6458

## 2013-01-06 HISTORY — DX: Personal history of other diseases of the musculoskeletal system and connective tissue: Z87.39

## 2013-01-06 HISTORY — PX: TRANSURETHRAL RESECTION OF PROSTATE: SHX73

## 2013-01-06 LAB — POCT I-STAT 4, (NA,K, GLUC, HGB,HCT)
HCT: 41 % (ref 39.0–52.0)
Hemoglobin: 13.9 g/dL (ref 13.0–17.0)
Potassium: 4 mEq/L (ref 3.5–5.1)
Sodium: 140 mEq/L (ref 135–145)

## 2013-01-06 SURGERY — CYSTOURETEROSCOPY, WITH RETROGRADE PYELOGRAM AND STENT INSERTION
Anesthesia: General | Site: Prostate

## 2013-01-06 MED ORDER — METOPROLOL TARTRATE 12.5 MG HALF TABLET
12.5000 mg | ORAL_TABLET | Freq: Two times a day (BID) | ORAL | Status: DC
  Administered 2013-01-06: 12.5 mg via ORAL
  Filled 2013-01-06 (×3): qty 1

## 2013-01-06 MED ORDER — HYDROMORPHONE HCL PF 1 MG/ML IJ SOLN
0.2500 mg | INTRAMUSCULAR | Status: DC | PRN
  Filled 2013-01-06: qty 1

## 2013-01-06 MED ORDER — DOCUSATE SODIUM 100 MG PO CAPS
100.0000 mg | ORAL_CAPSULE | Freq: Two times a day (BID) | ORAL | Status: DC
  Administered 2013-01-06: 100 mg via ORAL
  Filled 2013-01-06 (×3): qty 1

## 2013-01-06 MED ORDER — PROPOFOL 10 MG/ML IV BOLUS
INTRAVENOUS | Status: DC | PRN
  Administered 2013-01-06: 160 mg via INTRAVENOUS

## 2013-01-06 MED ORDER — ZOLPIDEM TARTRATE 5 MG PO TABS: 5.0000 mg | ORAL_TABLET | Freq: Every evening | ORAL | Status: DC | PRN

## 2013-01-06 MED ORDER — ESMOLOL HCL 10 MG/ML IV SOLN
INTRAVENOUS | Status: DC | PRN
  Administered 2013-01-06 (×2): 10 mg via INTRAVENOUS

## 2013-01-06 MED ORDER — CIPROFLOXACIN IN D5W 400 MG/200ML IV SOLN
400.0000 mg | INTRAVENOUS | Status: AC
  Administered 2013-01-06: 400 mg via INTRAVENOUS
  Filled 2013-01-06: qty 200

## 2013-01-06 MED ORDER — LACTATED RINGERS IV SOLN
INTRAVENOUS | Status: DC
  Administered 2013-01-06: 09:00:00 via INTRAVENOUS
  Filled 2013-01-06: qty 1000

## 2013-01-06 MED ORDER — DIPHENHYDRAMINE HCL 50 MG/ML IJ SOLN: 12.5000 mg | Freq: Four times a day (QID) | INTRAMUSCULAR | Status: DC | PRN

## 2013-01-06 MED ORDER — BELLADONNA ALKALOIDS-OPIUM 16.2-60 MG RE SUPP
RECTAL | Status: AC
  Filled 2013-01-06: qty 1

## 2013-01-06 MED ORDER — HYOSCYAMINE SULFATE 0.125 MG SL SUBL
0.1250 mg | SUBLINGUAL_TABLET | SUBLINGUAL | Status: DC | PRN
  Filled 2013-01-06: qty 1

## 2013-01-06 MED ORDER — PROMETHAZINE HCL 25 MG/ML IJ SOLN
6.2500 mg | INTRAMUSCULAR | Status: DC | PRN
  Filled 2013-01-06: qty 1

## 2013-01-06 MED ORDER — FUROSEMIDE 40 MG PO TABS
40.0000 mg | ORAL_TABLET | Freq: Every morning | ORAL | Status: DC
  Filled 2013-01-06: qty 1

## 2013-01-06 MED ORDER — FENTANYL CITRATE 0.05 MG/ML IJ SOLN
INTRAMUSCULAR | Status: DC | PRN
  Administered 2013-01-06: 12.5 ug via INTRAVENOUS
  Administered 2013-01-06: 50 ug via INTRAVENOUS
  Administered 2013-01-06: 12.5 ug via INTRAVENOUS
  Administered 2013-01-06: 25 ug via INTRAVENOUS

## 2013-01-06 MED ORDER — FENTANYL CITRATE 0.05 MG/ML IJ SOLN
INTRAMUSCULAR | Status: AC
  Filled 2013-01-06: qty 2

## 2013-01-06 MED ORDER — HYDROMORPHONE HCL PF 1 MG/ML IJ SOLN
0.5000 mg | INTRAMUSCULAR | Status: DC | PRN
  Administered 2013-01-06: 1 mg via INTRAVENOUS
  Filled 2013-01-06: qty 1

## 2013-01-06 MED ORDER — LIDOCAINE HCL (CARDIAC) 20 MG/ML IV SOLN
INTRAVENOUS | Status: DC | PRN
  Administered 2013-01-06: 80 mg via INTRAVENOUS

## 2013-01-06 MED ORDER — CIPROFLOXACIN IN D5W 400 MG/200ML IV SOLN
INTRAVENOUS | Status: AC
  Filled 2013-01-06: qty 200

## 2013-01-06 MED ORDER — ONDANSETRON HCL 4 MG/2ML IJ SOLN
INTRAMUSCULAR | Status: DC | PRN
  Administered 2013-01-06: 4 mg via INTRAVENOUS

## 2013-01-06 MED ORDER — HYDROCODONE-ACETAMINOPHEN 5-325 MG PO TABS: 1.0000 | ORAL_TABLET | Freq: Once | ORAL | Status: DC

## 2013-01-06 MED ORDER — PHENYLEPHRINE HCL 10 MG/ML IJ SOLN
10.0000 mg | INTRAVENOUS | Status: DC | PRN
  Administered 2013-01-06: 50 ug/min via INTRAVENOUS

## 2013-01-06 MED ORDER — DIPHENHYDRAMINE HCL 12.5 MG/5ML PO ELIX: 12.5000 mg | ORAL_SOLUTION | Freq: Four times a day (QID) | ORAL | Status: DC | PRN

## 2013-01-06 MED ORDER — SODIUM CHLORIDE 0.9 % IR SOLN
Status: DC | PRN
  Administered 2013-01-06: 12000 mL via INTRAVESICAL

## 2013-01-06 MED ORDER — ACETAMINOPHEN 325 MG PO TABS: 650.0000 mg | ORAL_TABLET | ORAL | Status: DC | PRN

## 2013-01-06 MED ORDER — CIPROFLOXACIN HCL 250 MG PO TABS
500.0000 mg | ORAL_TABLET | Freq: Two times a day (BID) | ORAL | Status: DC
  Administered 2013-01-06 – 2013-01-07 (×2): 500 mg via ORAL
  Filled 2013-01-06 (×4): qty 2

## 2013-01-06 MED ORDER — BISACODYL 10 MG RE SUPP: 10.0000 mg | Freq: Every day | RECTAL | Status: DC | PRN

## 2013-01-06 MED ORDER — IOHEXOL 350 MG/ML SOLN
INTRAVENOUS | Status: DC | PRN
  Administered 2013-01-06: 17 mL

## 2013-01-06 MED ORDER — CARBOXYMETHYLCELLULOSE SODIUM 1 % OP SOLN
1.0000 [drp] | Freq: Two times a day (BID) | OPHTHALMIC | Status: DC
Start: 2013-01-06 — End: 2013-01-06

## 2013-01-06 MED ORDER — POLYVINYL ALCOHOL 1.4 % OP SOLN
1.0000 [drp] | Freq: Two times a day (BID) | OPHTHALMIC | Status: DC
  Filled 2013-01-06: qty 15

## 2013-01-06 MED ORDER — AMLODIPINE BESYLATE 5 MG PO TABS
5.0000 mg | ORAL_TABLET | Freq: Every morning | ORAL | Status: DC
  Filled 2013-01-06: qty 1

## 2013-01-06 MED ORDER — BENAZEPRIL HCL 40 MG PO TABS
40.0000 mg | ORAL_TABLET | Freq: Every morning | ORAL | Status: DC
  Filled 2013-01-06: qty 1

## 2013-01-06 MED ORDER — KCL IN DEXTROSE-NACL 20-5-0.45 MEQ/L-%-% IV SOLN
INTRAVENOUS | Status: DC
  Administered 2013-01-06 – 2013-01-07 (×2): via INTRAVENOUS
  Filled 2013-01-06 (×4): qty 1000

## 2013-01-06 MED ORDER — ONDANSETRON HCL 4 MG/2ML IJ SOLN: 4.0000 mg | INTRAMUSCULAR | Status: DC | PRN

## 2013-01-06 SURGICAL SUPPLY — 54 items
BAG DRAIN URO-CYSTO SKYTR STRL (DRAIN) ×4 IMPLANT
BAG DRN ANRFLXCHMBR STRAP LEK (BAG)
BAG DRN UROCATH (DRAIN) ×3
BAG URINE DRAINAGE (UROLOGICAL SUPPLIES) ×4 IMPLANT
BAG URINE LEG 19OZ MD ST LTX (BAG) IMPLANT
BASKET LASER NITINOL 1.9FR (BASKET) IMPLANT
BASKET ZERO TIP NITINOL 2.4FR (BASKET) IMPLANT
BRUSH URET BIOPSY 3F (UROLOGICAL SUPPLIES) IMPLANT
BSKT STON RTRVL 120 1.9FR (BASKET)
BSKT STON RTRVL ZERO TP 2.4FR (BASKET)
CANISTER SUCT LVC 12 LTR MEDI- (MISCELLANEOUS) ×8 IMPLANT
CATH FOLEY 2WAY SLVR  5CC 20FR (CATHETERS)
CATH FOLEY 2WAY SLVR  5CC 22FR (CATHETERS)
CATH FOLEY 2WAY SLVR 30CC 20FR (CATHETERS) ×4 IMPLANT
CATH FOLEY 2WAY SLVR 30CC 22FR (CATHETERS) ×4 IMPLANT
CATH FOLEY 2WAY SLVR 5CC 20FR (CATHETERS) IMPLANT
CATH FOLEY 2WAY SLVR 5CC 22FR (CATHETERS) IMPLANT
CATH HEMA 3WAY 30CC 24FR COUDE (CATHETERS) IMPLANT
CATH HEMA 3WAY 30CC 24FR RND (CATHETERS) IMPLANT
CATH URET 5FR 28IN CONE TIP (BALLOONS)
CATH URET 5FR 28IN OPEN ENDED (CATHETERS) ×4 IMPLANT
CATH URET 5FR 70CM CONE TIP (BALLOONS) IMPLANT
CLOTH BEACON ORANGE TIMEOUT ST (SAFETY) ×4 IMPLANT
DRAPE CAMERA CLOSED 9X96 (DRAPES) ×4 IMPLANT
ELECT BUTTON HF 24-28F 2 30DE (ELECTRODE) ×4 IMPLANT
ELECT LOOP HF 26F 30D .35MM (CUTTING LOOP) IMPLANT
ELECT LOOP MED HF 24F 12D (CUTTING LOOP) ×4 IMPLANT
ELECT LOOP MED HF 24F 12D CBL (CLIP) ×4 IMPLANT
ELECT NEEDLE 45D HF 24-28F 12D (CUTTING LOOP) IMPLANT
ELECT REM PT RETURN 9FT ADLT (ELECTROSURGICAL) ×4
ELECTRODE REM PT RTRN 9FT ADLT (ELECTROSURGICAL) ×3 IMPLANT
FIBER LASER FLEXIVA 1000 (UROLOGICAL SUPPLIES) IMPLANT
FIBER LASER FLEXIVA 200 (UROLOGICAL SUPPLIES) IMPLANT
FIBER LASER FLEXIVA 365 (UROLOGICAL SUPPLIES) IMPLANT
FIBER LASER FLEXIVA 550 (UROLOGICAL SUPPLIES) IMPLANT
GLOVE SURG SS PI 8.0 STRL IVOR (GLOVE) ×4 IMPLANT
GOWN PREVENTION PLUS LG XLONG (DISPOSABLE) IMPLANT
GOWN STRL REIN XL XLG (GOWN DISPOSABLE) ×8 IMPLANT
GUIDEWIRE 0.038 PTFE COATED (WIRE) IMPLANT
GUIDEWIRE ANG ZIPWIRE 038X150 (WIRE) IMPLANT
GUIDEWIRE STR DUAL SENSOR (WIRE) ×4 IMPLANT
HOLDER FOLEY CATH W/STRAP (MISCELLANEOUS) IMPLANT
IV NS IRRIG 3000ML ARTHROMATIC (IV SOLUTION) ×4 IMPLANT
KIT ASPIRATION TUBING (SET/KITS/TRAYS/PACK) IMPLANT
KIT BALLIN UROMAX 15FX10 (LABEL) IMPLANT
KIT BALLN UROMAX 15FX4 (MISCELLANEOUS) IMPLANT
KIT BALLN UROMAX 26 75X4 (MISCELLANEOUS)
LOOP CUTTING 24FR OLYMPUS (CUTTING LOOP) IMPLANT
PACK CYSTOSCOPY (CUSTOM PROCEDURE TRAY) ×4 IMPLANT
PLUG CATH AND CAP STER (CATHETERS) IMPLANT
SET ASPIRATION TUBING (TUBING) ×4 IMPLANT
SET HIGH PRES BAL DIL (LABEL)
SHEATH ACCESS URETERAL 38CM (SHEATH) IMPLANT
SHEATH ACCESS URETERAL 54CM (SHEATH) IMPLANT

## 2013-01-06 NOTE — Transfer of Care (Signed)
Immediate Anesthesia Transfer of Care Note  Patient: Tommy Morris  Procedure(s) Performed: Procedure(s): CYSTOSCOPY WITH BILATERAL RETROGRADE PYELOGRAM, AND POSSIBLE STENT PLACEMENT, POSSIBLE MYTOMICIN C (Bilateral) TRANSURETHRAL RESECTION OF BLADDER TUMOR WITH GYRUS (TURBT-GYRUS) (N/A) TRANSURETHRAL RESECTION OF THE PROSTATE (TURP) (N/A)  Patient Location: PACU  Anesthesia Type:General  Level of Consciousness: awake and oriented  Airway & Oxygen Therapy: Patient Spontanous Breathing and Patient connected to nasal cannula oxygen  Post-op Assessment: Report given to PACU RN  Post vital signs: Reviewed and stable  Complications: No apparent anesthesia complications

## 2013-01-06 NOTE — Anesthesia Preprocedure Evaluation (Addendum)
Anesthesia Evaluation  Patient identified by MRN, date of birth, ID band Patient awake    Reviewed: Allergy & Precautions, H&P , NPO status , Patient's Chart, lab work & pertinent test results  Airway Mallampati: II TM Distance: >3 FB Neck ROM: Full    Dental no notable dental hx.    Pulmonary former smoker,  breath sounds clear to auscultation  Pulmonary exam normal       Cardiovascular hypertension, Pt. on medications and Pt. on home beta blockers +CHF + dysrhythmias Atrial Fibrillation Rhythm:Irregular Rate:Normal   echo 05/03/2009 mildly to moderately reduced LV systolic function ef 40-45%  Patient declines anticoagulation for stroke prophylaxis.   Neuro/Psych negative neurological ROS  negative psych ROS   GI/Hepatic negative GI ROS, Neg liver ROS,   Endo/Other  negative endocrine ROS  Renal/GU negative Renal ROS  negative genitourinary   Musculoskeletal negative musculoskeletal ROS (+)   Abdominal   Peds negative pediatric ROS (+)  Hematology negative hematology ROS (+)   Anesthesia Other Findings   Reproductive/Obstetrics negative OB ROS                          Anesthesia Physical Anesthesia Plan  ASA: III  Anesthesia Plan: General   Post-op Pain Management:    Induction: Intravenous  Airway Management Planned: LMA  Additional Equipment:   Intra-op Plan:   Post-operative Plan:   Informed Consent: I have reviewed the patients History and Physical, chart, labs and discussed the procedure including the risks, benefits and alternatives for the proposed anesthesia with the patient or authorized representative who has indicated his/her understanding and acceptance.   Dental advisory given  Plan Discussed with: CRNA and Surgeon  Anesthesia Plan Comments:        Anesthesia Quick Evaluation

## 2013-01-06 NOTE — Anesthesia Procedure Notes (Signed)
Procedure Name: LMA Insertion Date/Time: 01/06/2013 9:39 AM Performed by: Maris Berger T Pre-anesthesia Checklist: Patient identified, Emergency Drugs available, Suction available and Patient being monitored Patient Re-evaluated:Patient Re-evaluated prior to inductionOxygen Delivery Method: Circle System Utilized Preoxygenation: Pre-oxygenation with 100% oxygen Intubation Type: IV induction Ventilation: Mask ventilation without difficulty LMA: LMA inserted LMA Size: 4.0 Number of attempts: 1 Airway Equipment and Method: bite block Placement Confirmation: positive ETCO2 Dental Injury: Teeth and Oropharynx as per pre-operative assessment

## 2013-01-06 NOTE — Interval H&P Note (Signed)
History and Physical Interval Note:  He failed his recent voiding trial and will need a TURP with this procedure.  I reviewed the added risks of stricture, incontinence and ejaculatory dysfunction.   I will do a button TUR so I can still give [facility] if needed.   01/06/2013 8:21 AM  Janalyn Harder  has presented today for surgery, with the diagnosis of bladder tumors  The various methods of treatment have been discussed with the patient and family. After consideration of risks, benefits and other options for treatment, the patient has consented to  Procedure(s): CYSTOSCOPY WITH BILATERAL RETROGRADE PYELOGRAM, AND POSSIBLE STENT PLACEMENT, POSSIBLE MYTOMICIN C (Bilateral) TRANSURETHRAL RESECTION OF BLADDER TUMOR WITH GYRUS (TURBT-GYRUS) (N/A) TRANSURETHRAL RESECTION OF THE PROSTATE (TURP) (N/A) as a surgical intervention .  The patient's history has been reviewed, patient examined, no change in status, stable for surgery.  I have reviewed the patient's chart and labs.  Questions were answered to the patient's satisfaction.     Ridhaan Dreibelbis,Solmon J

## 2013-01-06 NOTE — Anesthesia Postprocedure Evaluation (Signed)
  Anesthesia Post-op Note  Patient: Tommy Morris  Procedure(s) Performed: Procedure(s) (LRB): CYSTOSCOPY WITH BILATERAL RETROGRADE PYELOGRAM,  (Bilateral) TRANSURETHRAL RESECTION OF BLADDER TUMOR WITH GYRUS (TURBT-GYRUS) (N/A) TRANSURETHRAL RESECTION OF THE PROSTATE (TURP) (N/A)  Patient Location: PACU  Anesthesia Type: General  Level of Consciousness: awake and alert   Airway and Oxygen Therapy: Patient Spontanous Breathing  Post-op Pain: mild  Post-op Assessment: Post-op Vital signs reviewed, Patient's Cardiovascular Status Stable, Respiratory Function Stable, Patent Airway and No signs of Nausea or vomiting  Last Vitals:  Filed Vitals:   01/06/13 1145  BP: 124/99  Pulse: 48  Temp:   Resp: 13    Post-op Vital Signs: stable   Complications: No apparent anesthesia complications

## 2013-01-06 NOTE — Brief Op Note (Signed)
01/06/2013  10:34 AM  PATIENT:  Tommy Morris  72 y.o. male  PRE-OPERATIVE DIAGNOSIS:  2-5cm bladder tumors, BPH with retention  POST-OPERATIVE DIAGNOSIS:  same  PROCEDURE:  Procedure(s): CYSTOSCOPY WITH BILATERAL RETROGRADE PYELOGRAM. TRANSURETHRAL RESECTION OF BLADDER TUMOR WITH GYRUS (TURBT-GYRUS) (N/A) TRANSURETHRAL RESECTION OF THE PROSTATE (TURP) (N/A)  SURGEON:  Surgeon(s) and Role:    * Bjorn Pippin, MD - Primary  PHYSICIAN ASSISTANT:   ASSISTANTS: none   ANESTHESIA:   general  EBL:  Total I/O In: 200 [I.V.:200] Out: -   BLOOD ADMINISTERED:none  DRAINS: Urinary Catheter (Foley)   LOCAL MEDICATIONS USED:  NONE  SPECIMEN:  Source of Specimen:  prostate chips, bladder tumor from bladder neck and posterior was.  biopsy from dome.   DISPOSITION OF SPECIMEN:  PATHOLOGY  COUNTS:  YES  TOURNIQUET:  * No tourniquets in log *  DICTATION: .Other Dictation: Dictation Number [id number]  PLAN OF CARE: Admit for overnight observation  PATIENT DISPOSITION:  PACU - hemodynamically stable.   Delay start of Pharmacological VTE agent (>24hrs) due to surgical blood loss or risk of bleeding: yes

## 2013-01-07 MED ORDER — DOCUSATE SODIUM 100 MG PO CAPS: 100.0000 mg | ORAL_CAPSULE | Freq: Two times a day (BID) | ORAL | Status: DC

## 2013-01-07 MED ORDER — CIPROFLOXACIN HCL 500 MG PO TABS: 500.0000 mg | ORAL_TABLET | Freq: Two times a day (BID) | ORAL | Status: DC

## 2013-01-07 NOTE — Progress Notes (Signed)
Pt is able to void, string of bottles complete. First void urine is red, no clots present, 2nd & 3rd void is pink tinged.  Tommy Morris

## 2013-01-07 NOTE — Op Note (Signed)
NAMEMarland Kitchen  CHOICE, KLEINSASSER NO.:  [id number]  MEDICAL RECORD NO.:  [id number]  LOCATION:  1424                         FACILITY:  [facility]  PHYSICIAN:  Excell Seltzer. Annabell Howells, M.D.    DATE OF BIRTH:  [birth year]  DATE OF PROCEDURE:  01/06/2013 DATE OF DISCHARGE:                              OPERATIVE REPORT   PROCEDURE: 1. Cystoscopy with bilateral retrograde pyelograms with     interpretation. 2. Transurethral resection of prostate. 3. Transurethral resection of bladder tumors 2-5 cm. 4. Biopsy of dome.  SURGEON:  Hondo Nanda. Annabell Howells, M.D.  PREOPERATIVE DIAGNOSES:  Benign prostatic hypertrophy with retention, multifocal bladder tumors.  POSTOPERATIVE DIAGNOSES:  Benign prostatic hypertrophy with retention, multifocal bladder tumors.  ANESTHESIA:  General.  SPECIMEN: 1. Prostate chips. 2. Bladder tumor from bladder neck. 3. Bladder tumor from right posterior wall. 4. Biopsy from dome.  DRAINS:  A 20-French Foley catheter.  BLOOD LOSS:  Minimal.  COMPLICATIONS:  None.  INDICATIONS:  Tommy Morris is a 72 year old African American male with a history of bladder tumors who was lost to followup.  He presented recently with urinary retention.  During his evaluation, he was found to have recurrent bladder tumors.  He has failed voiding trials despite medical therapy and is now to undergo cystoscopy with bilateral retrograde pyelograms, limited TURP, and transurethral resection of bladder tumors.  FINDINGS AND PROCEDURE:  He was given Cipro.  He was taken to the operating room where general anesthetic was induced.  He was placed in lithotomy position and fitted with PAS hose.  His perineum and genitalia were prepped with Betadine solution and he was draped in usual sterile fashion.  Cystoscopy was performed using a 22-French scope and, 12 and 70-degree lenses.  Examination revealed a normal urethra.  The external sphincter was intact.  The prostatic urethra was approximately  4 cm in length with bilobar hyperplasia with obstruction and a high bladder neck.  The bladder itself demonstrated moderate trabeculation.  There was papillary tumor involving the posterior bladder neck.  It measured approximately 3 x 3 cm.  There was a second tumor that measured approximately 2-3 cm that was on the right posterior bladder wall.  It appeared low grade, superficial, and stalk.  Additionally, there were some velvety edematous changes on the dome and posterior wall of the bladder that appeared most consistent with catheter irritation.  He had, had a catheter in for some time, but additional bladder tumor or carcinoma in situ were possibilities as well.  The ureteral orifices were identified lateral to the tumor at the bladder neck.  They were somewhat small, but a 5-French open-end catheter was used to perform retrograde pyelograms using Omnipaque.  The right retrograde pyelogram demonstrated a normal ureter and intrarenal collecting system.  The left retrograde pyelogram demonstrated normal ureter and intrarenal collecting system.  Once retrograde pyelography was performed, a 28-French continuous flow resectoscope sheath was inserted.  This was fitted with an [place] handle and a 12-degree lens along with a bipolar loop.  Saline was used for the irrigant.  The bladder neck was then resected from 5 to 7 o'clock as much as anything to allow resection of  the bladder tumor.  However, because of his retention, the resection was carried out to alongside the verumontanum with an adequate channel created by limited resection of both lateral lobes.  Once the prostate had been resected, the chips were evacuated and prostatic fossa hemostasis was achieved.  I then turned my attention to the bladder tumors.  The tumor in the bladder neck, which extended into the trigone was resected completely. It was a spreading papillary lesion.  It appeared superficial.  It  was difficult to determine if it was invasive, however, it did overlie what appeared to be prostate tissue.  Once this had been fully resected, the chips were removed and sent as a separate specimen.  The resection site was fulgurated.  The ureteral orifices were avoided during the resection.  The second tumor was then resected down to the bases with muscle fibers included in the specimen.  These chips were treated and hemostasis was obtained.  Once the obvious tumors had been resected, 2 chips were obtained from the abnormality on the dome.  It was too extensive to fully resect or fulgurate and since it appeared most consistent with catheter edema, I did not feel a full resection was indicated.  At this point, hemostasis was achieved.  The bladder was inspected, no retained chips, clots, or bleeding were noted.  The ureteral orifices were intact.  The catheter was backed out.  The prostatic urethra was patent.  The external sphincter was intact.  The bladder was left full. Pressure on the bladder produced an adequate stream.  A 22-French Foley catheter was inserted with the aid of a catheter guide.  The balloon was filled with 30 mL of sterile fluid.  The catheter was then hand irrigated with clear return.  I elected not to pursue mitomycin C irrigation because of the need for the prostate resection and also because of the extent of inflammation in the bladder and uncertainty as to whether he had just inflammation or extensive carcinoma in situ.  The patient's anesthetic was reversed.  He was taken out lithotomy position and moved to recovery room in stable condition.  There were no complications.     Excell Seltzer. Annabell Howells, M.D.     JJW/MEDQ  D:  01/06/2013  T:  01/07/2013  Job:  [id number]

## 2013-01-07 NOTE — Discharge Summary (Signed)
Physician Discharge Summary  Patient ID: Tommy Morris MRN: [id number] DOB/AGE: August 24, 1940 72 y.o.  Admit date: 01/06/2013 Discharge date: 01/07/2013  Admission Diagnoses:  Bladder cancer  Discharge Diagnoses:  Principal Problem:   Bladder cancer Active Problems:   CARCINOMA, BLADDER   Acute urinary retention   Past Medical History  Diagnosis Date  . Hypertension   . Diverticulosis of colon   . Pulmonary nodule     Two small right lung nodules on chest CT 04/25/2009  . Elevated PSA   . Bladder tumor   . History of bladder cancer     2006  S/P TURBT  UROTHELIAL CARCINOMA  . History of urinary retention     LAST ISSUE 11/2012 W/ ARF  . Hyperlipidemia   . Tinnitus of right ear     CHRONIC  . Bilateral renal cysts     SIMPLE  . History of gout   . BPH (benign prostatic hypertrophy) with urinary obstruction   . Atrial fibrillation chronic     echo 05/03/2009 mildly to moderately reduced LV systolic function ef 40-45%  Patient declines anticoagulation for stroke prophylaxis.  . Mild mitral valve prolapse   . Mitral valve regurgitation   . OA (osteoarthritis) of knee   . Wears glasses     Surgeries: Procedure(s): CYSTOSCOPY WITH BILATERAL RETROGRADE PYELOGRAM,  TRANSURETHRAL RESECTION OF BLADDER TUMOR WITH GYRUS (TURBT-GYRUS) TRANSURETHRAL RESECTION OF THE PROSTATE (TURP) on 01/06/2013   Consultants (if any):    Discharged Condition: Improved  Hospital Course: Tommy Morris is an 72 y.o. male who was admitted 01/06/2013 with a diagnosis of Bladder cancer and urinary retention and went to the operating room on 01/06/2013 and underwent the above named procedures.  He was found to have a 2-3cm right posterior wall tumor and a 3cm posterior bladder neck tumor along with probable catheter irritation with edema on the posterior wall and dome.   He did well post op and had pink urine this morning.  The foley was removed and he was discharged home when he voided.   He was  given perioperative antibiotics:  Anti-infectives   Start     Dose/Rate Route Frequency Ordered Stop   01/07/13 0000  ciprofloxacin (CIPRO) 500 MG tablet     500 mg Oral 2 times daily 01/07/13 0816     01/06/13 2000  ciprofloxacin (CIPRO) tablet 500 mg     500 mg Oral 2 times daily 01/06/13 1607     01/06/13 0904  ciprofloxacin (CIPRO) IVPB 400 mg     400 mg 200 mL/hr over 60 Minutes Intravenous 60 min pre-op 01/06/13 0904 01/06/13 1020    .  He was given sequential compression devices and early ambulation for DVT prophylaxis.  He benefited maximally from the hospital stay and there were no complications.    Recent vital signs:  Filed Vitals:   01/07/13 0633  BP: 113/85  Pulse: 86  Temp: 98.6 F (37 C)  Resp: 18    Recent laboratory studies:  Lab Results  Component Value Date   HGB 13.9 01/06/2013   HGB 13.2 01/01/2013   HGB 12.6* 12/24/2012   Lab Results  Component Value Date   WBC 4.8 01/01/2013   PLT 270 01/01/2013   Lab Results  Component Value Date   INR 1.08 12/24/2012   Lab Results  Component Value Date   NA 140 01/06/2013   K 4.0 01/06/2013   CL 100 01/01/2013   CO2 25 01/01/2013   BUN  15 2013-01-24   CREATININE 0.84 01-24-2013   GLUCOSE 84 01/06/2013    Discharge Medications:     Medication List         amLODipine 5 MG tablet  Commonly known as:  NORVASC  Take 5 mg by mouth every morning.     aspirin 81 MG EC tablet  Take 1 tablet (81 mg total) by mouth daily.     benazepril 40 MG tablet  Commonly known as:  LOTENSIN  Take 40 mg by mouth every morning.     ciprofloxacin 500 MG tablet  Commonly known as:  CIPRO  Take 1 tablet (500 mg total) by mouth 2 (two) times daily.     docusate sodium 100 MG capsule  Commonly known as:  COLACE  Take 1 capsule (100 mg total) by mouth 2 (two) times daily.     furosemide 40 MG tablet  Commonly known as:  LASIX  Take 40 mg by mouth every morning.     HYDROcodone-acetaminophen 5-325 MG per tablet   Commonly known as:  NORCO/VICODIN  Take 1-2 tablets by mouth once.     metoprolol tartrate 25 MG tablet  Commonly known as:  LOPRESSOR  Take 0.5 tablets (12.5 mg total) by mouth 2 (two) times daily.     REFRESH OP  Apply 1 drop to eye daily as needed (for dry eyes).        Diagnostic Studies: Dg Ankle Complete Left  January 24, 2013   CLINICAL DATA:  Pain  EXAM: LEFT ANKLE COMPLETE - 3+ VIEW  COMPARISON:  None  FINDINGS: Lateral soft tissue swelling.  Osseous mineralization normal.  Joint spaces preserved.  No acute fracture, dislocation or bone destruction.  Scattered atherosclerotic calcifications.  IMPRESSION: No acute osseous abnormalities.   Electronically Signed   By: Ulyses Southward M.D.   On: 01/24/13 13:11   US Renal  12/10/2012   CLINICAL DATA:  Urinary retention.  Hypertension.  EXAM: RENAL/URINARY TRACT ULTRASOUND COMPLETE  COMPARISON:  04/24/2009  FINDINGS: Right Kidney  Length: 12.1 cm. Normal echogenicity. There is mild hydronephrosis. Multiple cysts are noted within the right kidney. The largest is in the lower pole measuring 5.3 x 4.2 x 5.7 cm. Septation is noted stress set this dominant cyst is complicated by an internal area thin septation.  Left Kidney  Length: 13 cm. Normal echogenicity. Multiple cysts are noted. The largest is in the upper pole measuring 3.1 cm. Within the inferior pole there is a cyst measuring 1.6 x 1.4 x 1.9 cm. This cyst is hypoechoic and contains diffuse internal echoes.  Bladder  Thick walled urinary bladder is identified. Soft tissue filling defect is identified within the bladder base. There is blood flow identified within this defect which is suspicious for a mass. This measures 2.7 x 1.7 x 3.1 cm.  Other: The prostate gland is enlarged. The gland measures 5.1 x 4.7 x 5.6 cm.  IMPRESSION: 1. Prostate gland enlargement. There is bladder wall thickening which may reflect chronic bladder outlet obstruction. 2. Suspicious lesion is identified within the  bladder base. Cannot rule out urothelial tumor. Consider further evaluation with either contrast-enhanced hematuria protocol CT or direct visualization. 3. Bilateral renal cysts. At least two these cysts do not meet the criteria for simple cyst. Further assessment with contrast enhanced cross-sectional imaging is advised. The study of choice would be an MRI. 4. Mild right hydronephrosis.   Electronically Signed   By: Signa Kell M.D.   On: 12/10/2012 08:38   Dg  Chest Portable 1 View  01/01/2013   CLINICAL DATA:  Lower extremity swelling.  EXAM: PORTABLE CHEST - 1 VIEW  COMPARISON:  11/1912  FINDINGS: Lordotic positioning noted. Both lungs are clear. No evidence pleural effusion. Heart size is within normal limits.  IMPRESSION: No active disease.   Electronically Signed   By: Myles Rosenthal M.D.   On: 01/01/2013 12:22   Portable Chest 1 View  12/10/2012   CLINICAL DATA:  History of hypertension and urinary retention  EXAM: PORTABLE CHEST - 1 VIEW  COMPARISON:  Chest CT 04/25/2009  FINDINGS: The heart size and mediastinal contours are within normal limits. Both lungs are clear. The visualized skeletal structures are unremarkable.  IMPRESSION: No active disease.   Electronically Signed   By: Salome Holmes M.D.   On: 12/10/2012 14:20   Dg Knee Complete 4 Views Left  01/01/2013   CLINICAL DATA:  Pain and swelling.  EXAM: LEFT KNEE - COMPLETE 4+ VIEW  COMPARISON:  None.  FINDINGS: There is no evidence of fracture nor dislocation. There is chondrocalcinosis identified within the lateral compartment. Coarse atherosclerotic calcification and found within the popliteal artery.  IMPRESSION: No evidence of acute osseus abnormalities. Osteoarthritic changes. Atherosclerotic changes.   Electronically Signed   By: Salome Holmes M.D.   On: 01/01/2013 13:12    Disposition: 01-Home or Self Care        Follow-up Information   Follow up with Anner Crete, MD On 01/19/2013. [phone])    Specialty:  Urology    Contact information:   [address] [place] [address] [phone]        Signed: LUC, SHAMMAS 01/07/2013, 8:17 AM

## 2013-01-08 ENCOUNTER — Encounter (HOSPITAL_BASED_OUTPATIENT_CLINIC_OR_DEPARTMENT_OTHER): Payer: Self-pay | Admitting: Urology

## 2013-05-09 ENCOUNTER — Other Ambulatory Visit: Payer: Self-pay | Admitting: Internal Medicine

## 2013-05-11 NOTE — Telephone Encounter (Signed)
Refills approved.  Please schedule a follow-up appointment with in clinic.

## 2013-08-12 ENCOUNTER — Ambulatory Visit (INDEPENDENT_AMBULATORY_CARE_PROVIDER_SITE_OTHER): Payer: Medicare Other | Admitting: Internal Medicine

## 2013-08-12 ENCOUNTER — Encounter: Payer: Self-pay | Admitting: Internal Medicine

## 2013-08-12 VITALS — BP 139/82 | HR 69 | Temp 97.4°F | Ht 73.0 in | Wt 143.8 lb

## 2013-08-12 DIAGNOSIS — C679 Malignant neoplasm of bladder, unspecified: Secondary | ICD-10-CM

## 2013-08-12 DIAGNOSIS — I4891 Unspecified atrial fibrillation: Secondary | ICD-10-CM

## 2013-08-12 DIAGNOSIS — E7800 Pure hypercholesterolemia, unspecified: Secondary | ICD-10-CM

## 2013-08-12 DIAGNOSIS — I10 Essential (primary) hypertension: Secondary | ICD-10-CM

## 2013-08-12 LAB — CBC WITH DIFFERENTIAL/PLATELET
BASOS ABS: 0 10*3/uL (ref 0.0–0.1)
Basophils Relative: 1 % (ref 0–1)
EOS PCT: 4 % (ref 0–5)
Eosinophils Absolute: 0.2 10*3/uL (ref 0.0–0.7)
HCT: 43.1 % (ref 39.0–52.0)
Hemoglobin: 15.5 g/dL (ref 13.0–17.0)
LYMPHS PCT: 24 % (ref 12–46)
Lymphs Abs: 1 10*3/uL (ref 0.7–4.0)
MCH: 32.4 pg (ref 26.0–34.0)
MCHC: 36 g/dL (ref 30.0–36.0)
MCV: 90.2 fL (ref 78.0–100.0)
Monocytes Absolute: 0.5 10*3/uL (ref 0.1–1.0)
Monocytes Relative: 13 % — ABNORMAL HIGH (ref 3–12)
NEUTROS ABS: 2.4 10*3/uL (ref 1.7–7.7)
Neutrophils Relative %: 58 % (ref 43–77)
Platelets: 237 10*3/uL (ref 150–400)
RBC: 4.78 MIL/uL (ref 4.22–5.81)
RDW: 15.6 % — AB (ref 11.5–15.5)
WBC: 4.2 10*3/uL (ref 4.0–10.5)

## 2013-08-12 LAB — COMPLETE METABOLIC PANEL WITH GFR
ALBUMIN: 3.8 g/dL (ref 3.5–5.2)
ALT: 15 U/L (ref 0–53)
AST: 24 U/L (ref 0–37)
Alkaline Phosphatase: 144 U/L — ABNORMAL HIGH (ref 39–117)
BUN: 14 mg/dL (ref 6–23)
CALCIUM: 9.1 mg/dL (ref 8.4–10.5)
CHLORIDE: 100 meq/L (ref 96–112)
CO2: 28 mEq/L (ref 19–32)
Creat: 0.85 mg/dL (ref 0.50–1.35)
GFR, EST NON AFRICAN AMERICAN: 85 mL/min
GFR, Est African American: >89 mL/min
GLUCOSE: 90 mg/dL (ref 70–99)
POTASSIUM: 3.4 meq/L — AB (ref 3.5–5.3)
Sodium: 137 mEq/L (ref 135–145)
Total Bilirubin: 0.8 mg/dL (ref 0.2–1.2)
Total Protein: 7.2 g/dL (ref 6.0–8.3)

## 2013-08-12 LAB — LIPID PANEL
Cholesterol: 205 mg/dL — ABNORMAL HIGH (ref 0–200)
HDL: 82 mg/dL (ref >39)
LDL Cholesterol: 108 mg/dL — ABNORMAL HIGH (ref 0–99)
TRIGLYCERIDES: 77 mg/dL (ref <150)
Total CHOL/HDL Ratio: 2.5 Ratio
VLDL: 15 mg/dL (ref 0–40)

## 2013-08-12 NOTE — Assessment & Plan Note (Signed)
Lipids:    Component Value Date/Time   CHOL 231* 01/31/2011 1105   TRIG 77 01/31/2011 1105   HDL 100 01/31/2011 1105   LDLCALC 116* 01/31/2011 1105   VLDL 15 01/31/2011 1105   CHOLHDL 2.3 01/31/2011 1105    Assessment: Patient is currently managing with diet alone  Plan: Check a lipid panel today; continue dietary management pending the result.

## 2013-08-12 NOTE — Assessment & Plan Note (Addendum)
Assessment: Patient is status post S/P transurethral resection of bladder tumors by Dr. Jeffie Pollock on 01/06/2013; pathology showed high-grade papillary urothelial carcinoma with no invasion identified.  He currently reports no urinary symptoms.  Plan: Refer to Dr. Jeffie Pollock for ongoing followup of his bladder cancer.

## 2013-08-12 NOTE — Progress Notes (Signed)
   Subjective:    Patient ID: Tommy Morris, male    DOB: 1940-10-10, 73 y.o.   MRN: [id number]  Hypertension Pertinent negatives include no chest pain, palpitations or shortness of breath.   Patient returns for follow-up management of his hypertension, chronic atrial fibrillation, hypercholesterolemia, and other chronic medical problems.  He has no acute complaints today, and reports that he has been doing well.  He was apparently started on a low dose of metoprolol last fall for rate control, but says that he never took that medication as an outpatient.  He is followed by Dr. Jeffie Pollock for management of his bladder cancer, and reports no recent urinary problems.   Review of Systems  Constitutional: Negative for fever, chills and diaphoresis.  Respiratory: Negative for cough, shortness of breath and wheezing.   Cardiovascular: Negative for chest pain, palpitations and leg swelling.  Gastrointestinal: Negative for nausea, vomiting, abdominal pain, blood in stool and anal bleeding.  Genitourinary: Negative for dysuria.    I reviewed and updated the medications, allergies, past medical history, past surgical history, family history, and social history.     Objective:   Physical Exam  Constitutional: No distress.  Cardiovascular: Normal rate and regular rhythm.  Exam reveals no gallop and no friction rub.   No murmur heard. Pulmonary/Chest: Effort normal and breath sounds normal. No respiratory distress. He has no wheezes. He has no rales. He exhibits no tenderness.  Abdominal: Soft. Bowel sounds are normal. He exhibits no distension. There is no tenderness. There is no rebound and no guarding.  Musculoskeletal: He exhibits no edema.

## 2013-08-12 NOTE — Assessment & Plan Note (Signed)
Assessment: Patient has chronic atrial fibrillation, and his rate control is adequate without medication.  As previously discussed, he elected against anticoagulation for stroke prophylaxis in the past and chose to continue aspirin 81 mg daily.  Plan: Continue aspirin 81 mg daily.

## 2013-08-12 NOTE — Assessment & Plan Note (Signed)
BP Readings from Last 3 Encounters:  08/12/13 139/82  01/07/13 113/85  01/07/13 113/85    Lab Results  Component Value Date   NA 140 01/06/2013   K 4.0 01/06/2013   CREATININE 0.84 01/01/2013    Assessment: Blood pressure control: controlled Progress toward BP goal:  at goal Comments: Blood pressure is controlled on amlodipine 5 mg daily, benazepril 40 mg daily, and furosemide 40 mg daily.  Plan: Medications:  continue current medications Other plans: Check a comprehensive metabolic panel today

## 2013-08-12 NOTE — Patient Instructions (Signed)
General Instructions: Continue current medications.   Progress Toward Treatment Goals:  Treatment Goal 08/12/2013  Blood pressure at goal    Self Care Goals & Plans:  Self Care Goal 08/12/2013  Manage my medications -  Eat healthy foods -  Be physically active -  Meeting treatment goals maintain the current self-care plan    No flowsheet data found.   Care Management & Community Referrals:  Referral 08/12/2013  Referrals made for care management support none needed  Referrals made to community resources none

## 2013-08-14 ENCOUNTER — Telehealth: Payer: Self-pay | Admitting: *Deleted

## 2013-08-14 ENCOUNTER — Other Ambulatory Visit: Payer: Self-pay | Admitting: Internal Medicine

## 2013-08-14 DIAGNOSIS — E876 Hypokalemia: Secondary | ICD-10-CM

## 2013-08-14 MED ORDER — POTASSIUM CHLORIDE CRYS ER 10 MEQ PO TBCR: 10.0000 meq | EXTENDED_RELEASE_TABLET | Freq: Every day | ORAL | Status: DC

## 2013-08-14 NOTE — Progress Notes (Signed)
Potassium is mildly low.  Plan is to start potassium chloride 10 mEq daily, and recheck in 1 month.

## 2013-08-14 NOTE — Progress Notes (Signed)
Quick Note:  Potassium is mildly low. Plan is to start potassium chloride 10 mEq daily, and recheck in 1 month. ______

## 2013-08-14 NOTE — Telephone Encounter (Signed)
Attmepted to contact pt at number in chart ([phone]), number disconnected.  Contacted pt's pharmacy and was given the correct number of [phone] (will correct in chart). Pt contacted and informed that his potassium was low and MD has sent in a new rx for potassium 10 meq take one tab daily. Pt will return to clinic in one mth (09/15/2013) for repeat BMET, which Dr Marinda Elk has already placed order for. Phone call complete.Despina Hidden Cassady7/24/20153:07 PM

## 2013-09-15 ENCOUNTER — Other Ambulatory Visit (INDEPENDENT_AMBULATORY_CARE_PROVIDER_SITE_OTHER): Payer: Medicare Other

## 2013-09-15 DIAGNOSIS — E876 Hypokalemia: Secondary | ICD-10-CM

## 2013-09-16 LAB — BASIC METABOLIC PANEL WITH GFR
BUN: 15 mg/dL (ref 6–23)
CALCIUM: 9 mg/dL (ref 8.4–10.5)
CO2: 28 meq/L (ref 19–32)
Chloride: 100 mEq/L (ref 96–112)
Creat: 1.06 mg/dL (ref 0.50–1.35)
GFR, Est African American: 79 mL/min
GFR, Est Non African American: 68 mL/min
GLUCOSE: 130 mg/dL — AB (ref 70–99)
Potassium: 3.8 mEq/L (ref 3.5–5.3)
SODIUM: 139 meq/L (ref 135–145)

## 2014-01-08 ENCOUNTER — Other Ambulatory Visit: Payer: Self-pay | Admitting: *Deleted

## 2014-01-08 MED ORDER — POTASSIUM CHLORIDE CRYS ER 10 MEQ PO TBCR: 10.0000 meq | EXTENDED_RELEASE_TABLET | Freq: Every day | ORAL | Status: DC

## 2014-01-08 NOTE — Telephone Encounter (Signed)
Pt states has not taken this in a while, but has been eating a lot of bananas. Had to change insurance coverage and new plan does not cover as much on medications.  He needs a refill.  Will send request to pcp for review and to see if pt needs a repeat bmet prior to restarting. Will also send message to front office staff to make a f/u appt with pcp..  Please advise.Despina Hidden Cassady12/18/201512:31 PM

## 2014-01-08 NOTE — Telephone Encounter (Signed)
I refilled the potassium chloride 10 mEq daily.  I will check a basic metabolic panel when I see him in January.

## 2014-02-03 ENCOUNTER — Ambulatory Visit (INDEPENDENT_AMBULATORY_CARE_PROVIDER_SITE_OTHER): Payer: Medicare Other | Admitting: Internal Medicine

## 2014-02-03 ENCOUNTER — Encounter: Payer: Self-pay | Admitting: Internal Medicine

## 2014-02-03 VITALS — BP 140/99 | HR 99 | Temp 97.5°F | Ht 73.0 in | Wt 150.6 lb

## 2014-02-03 DIAGNOSIS — E7800 Pure hypercholesterolemia, unspecified: Secondary | ICD-10-CM

## 2014-02-03 DIAGNOSIS — Z1211 Encounter for screening for malignant neoplasm of colon: Secondary | ICD-10-CM

## 2014-02-03 DIAGNOSIS — E876 Hypokalemia: Secondary | ICD-10-CM

## 2014-02-03 DIAGNOSIS — C679 Malignant neoplasm of bladder, unspecified: Secondary | ICD-10-CM

## 2014-02-03 DIAGNOSIS — I10 Essential (primary) hypertension: Secondary | ICD-10-CM

## 2014-02-03 LAB — CBC WITH DIFFERENTIAL/PLATELET
Basophils Absolute: 0 10*3/uL (ref 0.0–0.1)
Basophils Relative: 1 % (ref 0–1)
Eosinophils Absolute: 0.2 10*3/uL (ref 0.0–0.7)
Eosinophils Relative: 4 % (ref 0–5)
HEMATOCRIT: 41.3 % (ref 39.0–52.0)
Hemoglobin: 14.2 g/dL (ref 13.0–17.0)
LYMPHS ABS: 1.1 10*3/uL (ref 0.7–4.0)
Lymphocytes Relative: 25 % (ref 12–46)
MCH: 31.8 pg (ref 26.0–34.0)
MCHC: 34.4 g/dL (ref 30.0–36.0)
MCV: 92.4 fL (ref 78.0–100.0)
MONOS PCT: 10 % (ref 3–12)
MPV: 11.2 fL (ref 8.6–12.4)
Monocytes Absolute: 0.4 10*3/uL (ref 0.1–1.0)
NEUTROS ABS: 2.5 10*3/uL (ref 1.7–7.7)
NEUTROS PCT: 60 % (ref 43–77)
Platelets: 195 10*3/uL (ref 150–400)
RBC: 4.47 MIL/uL (ref 4.22–5.81)
RDW: 14.2 % (ref 11.5–15.5)
WBC: 4.2 10*3/uL (ref 4.0–10.5)

## 2014-02-03 MED ORDER — BENAZEPRIL HCL 40 MG PO TABS: 40.0000 mg | ORAL_TABLET | Freq: Every day | ORAL | Status: DC

## 2014-02-03 MED ORDER — ASPIRIN 81 MG PO TBEC: 81.0000 mg | DELAYED_RELEASE_TABLET | Freq: Every day | ORAL | Status: DC

## 2014-02-03 MED ORDER — FUROSEMIDE 40 MG PO TABS
40.0000 mg | ORAL_TABLET | Freq: Every day | ORAL | Status: DC
Start: 2014-02-03 — End: 2014-10-07

## 2014-02-03 MED ORDER — AMLODIPINE BESYLATE 5 MG PO TABS: 5.0000 mg | ORAL_TABLET | Freq: Every day | ORAL | Status: DC

## 2014-02-03 MED ORDER — POTASSIUM CHLORIDE CRYS ER 10 MEQ PO TBCR: 10.0000 meq | EXTENDED_RELEASE_TABLET | Freq: Every day | ORAL | Status: DC

## 2014-02-03 NOTE — Assessment & Plan Note (Addendum)
BP Readings from Last 3 Encounters:  02/03/14 140/99  08/12/13 139/82  01/07/13 113/85    Lab Results  Component Value Date   NA 139 09/15/2013   K 3.8 09/15/2013   CREATININE 1.06 09/15/2013    Assessment: Blood pressure control: moderately elevated Progress toward BP goal:  deteriorated Comments: Patient's blood pressure was previously well controlled on amlodipine 5 mg daily, benazepril 40 mg daily, and furosemide 40 mg daily.  However, he reports that he stopped taking the benazepril and amlodipine because of the cost of the co-pay; he has recently been taking furosemide 40 mg daily only.  Plan: Medications:  Continue amlodipine 5 mg daily, benazepril 40 mg daily, and furosemide 40 mg daily; I provided patient with printed and signed prescriptions today and advised him to check with other pharmacies to compare prices for the benazepril and amlodipine.  Benazepril should be available as a $4 medication through [facility]. Other plans: Check a comprehensive metabolic panel today.

## 2014-02-03 NOTE — Patient Instructions (Signed)
Continue current medications; I provided printed prescription so you can compare prices at different pharmacies for your blood pressure medications. A referral has been made for colonoscopy to screen for colon cancer.

## 2014-02-03 NOTE — Assessment & Plan Note (Signed)
Assessment: Patient is status post S/P transurethral resection of bladder tumors by Dr. Jeffie Pollock on 01/06/2013; pathology showed high-grade papillary urothelial carcinoma with no invasion identified.  He reports no urinary symptoms at this time.   Plan: I advised patient to follow-up with Dr. Jeffie Pollock.

## 2014-02-03 NOTE — Assessment & Plan Note (Signed)
Lipids:    Component Value Date/Time   CHOL 205* 08/12/2013 1154   TRIG 77 08/12/2013 1154   TRIG 108 04/24/2009   HDL 82 08/12/2013 1154   LDLCALC 108* 08/12/2013 1154   VLDL 15 08/12/2013 1154   CHOLHDL 2.5 08/12/2013 1154   Assessment:  LDL is at goal on dietary  management alone.  Plan: Continue dietary management.

## 2014-02-03 NOTE — Assessment & Plan Note (Signed)
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  He has recently.been taking his potassium chloride supplement.  Plan: Check a metabolic panel today.  Continue potassium chloride 10 mEq daily pending the result; I provided a refill today.

## 2014-02-03 NOTE — Progress Notes (Signed)
   Subjective:    Patient ID: Tommy Morris, male    DOB: Feb 02, 1940, 74 y.o.   MRN: [id number]  HPI Patient returns for management of his hypertension, hypercholesterolemia, and other chronic medical problems.  He has no acute complaints today, and reports that he has been feeling well.  He reports that he has been out of his medications other than his furosemide and aspirin because he could not afford the co-pay.   Review of Systems  Constitutional: Negative for fever, chills and diaphoresis.  Respiratory: Negative for cough, shortness of breath and wheezing.   Cardiovascular: Negative for chest pain and leg swelling.  Gastrointestinal: Negative for nausea, vomiting, abdominal pain, blood in stool and anal bleeding.  Genitourinary: Negative for dysuria and hematuria.  Musculoskeletal: Negative for myalgias and arthralgias.  Neurological: Negative for dizziness, syncope, weakness and numbness.   I reviewed and updated the medication list, allergies, past medical history, past surgical history, family history, and social history.     Objective:   Physical Exam  Constitutional: No distress.  Cardiovascular: Normal rate, regular rhythm, S1 normal and S2 normal.  Exam reveals no S3 and no S4.   Murmur heard.  Crescendo decrescendo systolic murmur is present with a grade of 2/6  No leg edema.  Abdominal: Soft. Normal appearance and bowel sounds are normal. There is no hepatosplenomegaly. There is no tenderness.       Assessment & Plan:

## 2014-02-04 LAB — COMPLETE METABOLIC PANEL WITH GFR
ALK PHOS: 119 U/L — AB (ref 39–117)
ALT: 20 U/L (ref 0–53)
AST: 31 U/L (ref 0–37)
Albumin: 3.3 g/dL — ABNORMAL LOW (ref 3.5–5.2)
BUN: 19 mg/dL (ref 6–23)
CO2: 27 mEq/L (ref 19–32)
Calcium: 9.2 mg/dL (ref 8.4–10.5)
Chloride: 104 mEq/L (ref 96–112)
Creat: 0.88 mg/dL (ref 0.50–1.35)
GFR, Est African American: >89 mL/min
GFR, Est Non African American: 84 mL/min
Glucose, Bld: 50 mg/dL — ABNORMAL LOW (ref 70–99)
Potassium: 3.6 mEq/L (ref 3.5–5.3)
SODIUM: 140 meq/L (ref 135–145)
TOTAL PROTEIN: 6.6 g/dL (ref 6.0–8.3)
Total Bilirubin: 0.8 mg/dL (ref 0.2–1.2)

## 2014-02-09 ENCOUNTER — Encounter: Payer: Self-pay | Admitting: Internal Medicine

## 2014-02-09 DIAGNOSIS — R748 Abnormal levels of other serum enzymes: Secondary | ICD-10-CM | POA: Insufficient documentation

## 2014-02-09 NOTE — Progress Notes (Signed)
Quick Note:  Alkaline phosphatase level is minimally elevated, improved from last year. Plan is recheck upon return. ______

## 2014-02-23 NOTE — Addendum Note (Signed)
Addended by: Hulan Fray on: 02/23/2014 08:08 PM   Modules accepted: Orders

## 2014-03-29 DIAGNOSIS — Z8551 Personal history of malignant neoplasm of bladder: Secondary | ICD-10-CM | POA: Diagnosis not present

## 2014-03-29 DIAGNOSIS — R972 Elevated prostate specific antigen [PSA]: Secondary | ICD-10-CM | POA: Diagnosis not present

## 2014-03-29 DIAGNOSIS — R312 Other microscopic hematuria: Secondary | ICD-10-CM | POA: Diagnosis not present

## 2014-03-30 DIAGNOSIS — R312 Other microscopic hematuria: Secondary | ICD-10-CM | POA: Diagnosis not present

## 2014-03-30 DIAGNOSIS — N281 Cyst of kidney, acquired: Secondary | ICD-10-CM | POA: Diagnosis not present

## 2014-04-05 DIAGNOSIS — C672 Malignant neoplasm of lateral wall of bladder: Secondary | ICD-10-CM | POA: Diagnosis not present

## 2014-04-05 DIAGNOSIS — R972 Elevated prostate specific antigen [PSA]: Secondary | ICD-10-CM | POA: Diagnosis not present

## 2014-04-07 ENCOUNTER — Ambulatory Visit (INDEPENDENT_AMBULATORY_CARE_PROVIDER_SITE_OTHER): Payer: Medicare Other | Admitting: Internal Medicine

## 2014-04-07 ENCOUNTER — Encounter: Payer: Self-pay | Admitting: Internal Medicine

## 2014-04-07 VITALS — BP 137/78 | HR 92 | Temp 98.2°F | Wt 143.5 lb

## 2014-04-07 DIAGNOSIS — I482 Chronic atrial fibrillation, unspecified: Secondary | ICD-10-CM

## 2014-04-07 DIAGNOSIS — R748 Abnormal levels of other serum enzymes: Secondary | ICD-10-CM

## 2014-04-07 DIAGNOSIS — I10 Essential (primary) hypertension: Secondary | ICD-10-CM

## 2014-04-07 DIAGNOSIS — R918 Other nonspecific abnormal finding of lung field: Secondary | ICD-10-CM | POA: Diagnosis not present

## 2014-04-07 DIAGNOSIS — Z7982 Long term (current) use of aspirin: Secondary | ICD-10-CM

## 2014-04-07 DIAGNOSIS — R634 Abnormal weight loss: Secondary | ICD-10-CM | POA: Diagnosis not present

## 2014-04-07 DIAGNOSIS — E7800 Pure hypercholesterolemia, unspecified: Secondary | ICD-10-CM

## 2014-04-07 DIAGNOSIS — C679 Malignant neoplasm of bladder, unspecified: Secondary | ICD-10-CM | POA: Diagnosis not present

## 2014-04-07 LAB — COMPLETE METABOLIC PANEL WITH GFR
ALK PHOS: 151 U/L — AB (ref 39–117)
ALT: 18 U/L (ref 0–53)
AST: 30 U/L (ref 0–37)
Albumin: 3.7 g/dL (ref 3.5–5.2)
BUN: 12 mg/dL (ref 6–23)
CO2: 26 mEq/L (ref 19–32)
CREATININE: 0.9 mg/dL (ref 0.50–1.35)
Calcium: 9 mg/dL (ref 8.4–10.5)
Chloride: 100 mEq/L (ref 96–112)
GFR, Est African American: >89 mL/min
GFR, Est Non African American: 83 mL/min
Glucose, Bld: 75 mg/dL (ref 70–99)
Potassium: 3.5 mEq/L (ref 3.5–5.3)
Sodium: 140 mEq/L (ref 135–145)
TOTAL PROTEIN: 7 g/dL (ref 6.0–8.3)
Total Bilirubin: 0.7 mg/dL (ref 0.2–1.2)

## 2014-04-07 NOTE — Assessment & Plan Note (Signed)
Assessment: Two small right lung nodules were seen on chest CT scan 04/25/2009; a follow-up CT was advised in 6-12 months.  A repeat CT scan was ordered in 2013, but patient did not follow up for the repeat chest CT scan.  In 2014 I discussed again the importance of having a follow-up chest CT scan, but patient did not have the scan done.  I discussed this again with him today, and he agreed to have the chest CT scan done.    Plan: I again ordered a follow-up chest CT scan.

## 2014-04-07 NOTE — Assessment & Plan Note (Signed)
Wt Readings from Last 3 Encounters:  04/07/14 143 lb 8 oz (65.091 kg)  02/03/14 150 lb 9.6 oz (68.312 kg)  08/12/13 143 lb 12.8 oz (65.227 kg)    Assessment: Patient's weight is stable overall compared to July of last year.  He again declined colonoscopy, as he has done in the past.   Plan: Encourage good nutritional intake; chest CT scan as above to follow-up pulmonary nodules; follow weight.

## 2014-04-07 NOTE — Progress Notes (Signed)
   Subjective:    Patient ID: Tommy Morris, male    DOB: [birth year], 74 y.o.   MRN: [id number]  HPI Patient returns for management of his hypertension, hypercholesterolemia, chronic atrial fibrillation, and other chronic medical problems.  He has no complaints today, and reports that he has been doing well.  He reports that he saw his urologist this week, and apparently underwent a cystoscopy.  Patient reports that he is compliant with his medications.   Review of Systems  Constitutional: Negative for fever, chills and diaphoresis.  Respiratory: Negative for cough and shortness of breath.   Cardiovascular: Negative for chest pain and leg swelling.  Gastrointestinal: Negative for nausea, vomiting, abdominal pain, blood in stool and anal bleeding.  Genitourinary: Negative for dysuria and difficulty urinating.  Neurological: Negative for dizziness and syncope.    I reviewed and updated the medication list, allergies, past medical history, past surgical history, family history, and social history.      Objective:   Physical Exam  Constitutional: No distress.  Cardiovascular: S1 normal and S2 normal.  An irregular rhythm present. Exam reveals no S3 and no S4.   No murmur heard. No lower extremity edema.  Pulmonary/Chest: Effort normal and breath sounds normal. No respiratory distress. He has no wheezes. He has no rales.  Abdominal: Soft. Bowel sounds are normal. He exhibits no distension. There is no hepatosplenomegaly. There is no tenderness. There is no rebound.        Assessment & Plan:

## 2014-04-07 NOTE — Assessment & Plan Note (Addendum)
Assessment: Patient has chronic atrial fibrillation, and his rate control is acceptable without medication.  As previously documented, he has elected against anticoagulation for stroke prophylaxis.  Plan: Continue aspirin 81 mg daily.

## 2014-04-07 NOTE — Patient Instructions (Signed)
Continue current medications. A referral has been made for a chest CT scan to follow up two small pulmonary nodules seen on prior CT scan.

## 2014-04-07 NOTE — Assessment & Plan Note (Signed)
BP Readings from Last 3 Encounters:  04/07/14 137/78  02/03/14 140/99  08/12/13 139/82    Lab Results  Component Value Date   NA 140 02/03/2014   K 3.6 02/03/2014   CREATININE 0.88 02/03/2014    Assessment: Blood pressure control: controlled Progress toward BP goal:  at goal Comments: Blood pressure is at goal on amlodipine 5 mg daily, benazepril 40 mg daily, and furosemide 40 mg daily.  Plan: Medications:  continue current medications Educational resources provided: brochure Self management tools provided: home blood pressure logbook

## 2014-04-07 NOTE — Assessment & Plan Note (Signed)
Lab Results  Component Value Date   ALKPHOS 119* 02/03/2014   ALKPHOS 144* 08/12/2013   ALKPHOS 96 01/01/2013     Assessment: Patient has a very mildly elevated alkaline phosphatase level which is asymptomatic.  Plan: Check a comprehensive metabolic panel and GGT today; if alkaline phosphatase is persistently elevated and of liver origin, get right upper quadrant ultrasound.

## 2014-04-07 NOTE — Assessment & Plan Note (Signed)
Assessment: Patient is status post S/P transurethral resection of bladder tumors by Dr. Jeffie Pollock on 01/06/2013; pathology showed high-grade papillary urothelial carcinoma with no invasion identified.  Patient reports that he saw his urologist earlier this week and underwent a cystoscopy.     Plan: Will request copies of Dr. Ralene Muskrat office note.  I advised patient to follow-up with Dr. Jeffie Pollock as per his recommendations.

## 2014-04-07 NOTE — Assessment & Plan Note (Signed)
Lipids:    Component Value Date/Time   CHOL 205* 08/12/2013 1154   TRIG 77 08/12/2013 1154   TRIG 108 04/24/2009   HDL 82 08/12/2013 1154   LDLCALC 108* 08/12/2013 1154   VLDL 15 08/12/2013 1154   CHOLHDL 2.5 08/12/2013 1154   Assessment:  I discussed the 2013 ACC cholesterol guidelines with patient; according to those guidelines, statin therapy should be considered.  Patient declined medication for his hypercholesterolemia, and prefers to continue dietary management.  Plan: Continue dietary management.

## 2014-04-08 ENCOUNTER — Other Ambulatory Visit: Payer: Self-pay | Admitting: Internal Medicine

## 2014-04-08 DIAGNOSIS — N281 Cyst of kidney, acquired: Secondary | ICD-10-CM | POA: Insufficient documentation

## 2014-04-08 DIAGNOSIS — R748 Abnormal levels of other serum enzymes: Secondary | ICD-10-CM

## 2014-04-08 LAB — GAMMA GT: GGT: 159 U/L — AB (ref 7–51)

## 2014-04-08 NOTE — Progress Notes (Signed)
Quick Note:  Alkaline phosphatase remains elevated, and the elevated GGT supports a liver origin; plan is to evaluate liver/right upper quadrant with ultrasound. Also, chart review shows a finding of bilateral renal cysts on 12/10/2012, at least two of which did not meet the criteria for simple cysts; further assessment with contrast enhanced cross-sectional imaging was advised (the study of choice being an MRI), but this has apparently not been done. Since ultrasound of the right upper quadrant is needed, plan is to obtain a complete abdominal ultrasound to assess both the liver and the current status of the renal cysts; will likely then need to proceed to cross-sectional imaging of the kidneys. ______

## 2014-04-09 ENCOUNTER — Telehealth: Payer: Self-pay | Admitting: *Deleted

## 2014-04-09 ENCOUNTER — Encounter: Payer: Self-pay | Admitting: Internal Medicine

## 2014-04-09 ENCOUNTER — Encounter: Payer: Self-pay | Admitting: *Deleted

## 2014-04-09 ENCOUNTER — Other Ambulatory Visit: Payer: Self-pay | Admitting: Internal Medicine

## 2014-04-09 DIAGNOSIS — C679 Malignant neoplasm of bladder, unspecified: Secondary | ICD-10-CM

## 2014-04-09 DIAGNOSIS — R748 Abnormal levels of other serum enzymes: Secondary | ICD-10-CM

## 2014-04-09 DIAGNOSIS — R634 Abnormal weight loss: Secondary | ICD-10-CM

## 2014-04-09 DIAGNOSIS — N281 Cyst of kidney, acquired: Secondary | ICD-10-CM

## 2014-04-09 NOTE — Progress Notes (Signed)
I received a copy of patient's [facility] 04/05/2014 office visit with Dr. Irine Seal; the note included the report of a CT scan of the abdomen and pelvis without and with contrast done 03/30/2014 by [facility] which showed: a 7 mm left-sided bladder lesion worrisome for small recurrent or new neoplasm with cystoscopic evaluation recommended; numerous bilateral renal cysts but no worrisome renal lesions; and no mesenteric or retroperitoneal mass or adenopathy; there were no focal hepatic lesions or intrahepatic biliary dilatation; there was a small cyst associated with the right hepatic lobe posteriorly and inferiorly; the gallbladder was normal; there was mild stable common bile duct dilatation.  Patient underwent cystoscopy on 04/05/2014 which showed a 7 mm papillary bladder tumor of the left lateral wall and a smaller worrisome lesion in the trigone that were biopsied; there was also a mucosal abnormality at the left prostatic apex that was biopsied.  Dr. Jeffie Pollock plans to call patient with the biopsy results when they return; he also plans a three-month follow-up for cystoscopy unless the biopsy suggests a need for additional therapy.  Given his other problems of elevated alkaline phosphatase of liver origin, and a history of bilateral renal cysts noted in 12/10/2012 on renal ultrasound, I reviewed the abdominal/pelvic CT scan with radiology here (they were able to pull up the images on the PACS system.)  This review confirmed that there was no significant liver or kidney pathology.  I therefore canceled the previously scheduled abdominal ultrasound.  Plan is to obtain TSH, hepatitis serology, and AMA; if these show no significant abnormality, then plan is to observe and follow alkaline phosphatase level.  Review of the CT scan with radiologist confirmed that no further imaging is indicated to follow up the renal cysts.

## 2014-04-09 NOTE — Assessment & Plan Note (Signed)
Documentation Note  I received a copy of patient's [facility] 04/05/2014 office visit with Dr. Irine Seal; the note included the report of a CT scan of the abdomen and pelvis without and with contrast done 03/30/2014 by [facility] which showed: a 7 mm left-sided bladder lesion worrisome for small recurrent or new neoplasm with cystoscopic evaluation recommended; numerous bilateral renal cysts but no worrisome renal lesions; and no mesenteric or retroperitoneal mass or adenopathy; there were no focal hepatic lesions or intrahepatic biliary dilatation; there was a small cyst associated with the right hepatic lobe posteriorly and inferiorly; the gallbladder was normal; there was mild stable common bile duct dilatation.  Given his history of bilateral renal cysts noted in 12/10/2012 on renal ultrasound, I reviewed the abdominal/pelvic CT scan with radiology here (they were able to pull up the images on the PACS system.)  This review confirmed that there was no significant liver or kidney pathology, and that no further imaging is indicated to follow up the renal cysts.

## 2014-04-09 NOTE — Assessment & Plan Note (Signed)
Documentation Note   I received a copy of patient's [facility] 04/05/2014 office visit with Dr. Irine Seal; the note included the report of a CT scan of the abdomen and pelvis without and with contrast done 03/30/2014 by [facility] which showed: a 7 mm left-sided bladder lesion worrisome for small recurrent or new neoplasm with cystoscopic evaluation recommended; numerous bilateral renal cysts but no worrisome renal lesions; and no mesenteric or retroperitoneal mass or adenopathy; there were no focal hepatic lesions or intrahepatic biliary dilatation; there was a small cyst associated with the right hepatic lobe posteriorly and inferiorly; the gallbladder was normal; there was mild stable common bile duct dilatation.  I reviewed the abdominal/pelvic CT scan with radiology here (they were able to pull up the images on the PACS system.)  This review confirmed that there was no significant liver or kidney pathology.  I therefore canceled the previously scheduled abdominal ultrasound.  Plan is to obtain TSH, hepatitis serology, and AMA; if these show no significant abnormality, then plan is to observe and follow alkaline phosphatase level.

## 2014-04-09 NOTE — Assessment & Plan Note (Addendum)
Documentation Note  I received a copy of patient's [facility] 04/05/2014 office visit with Dr. Irine Seal; the note included the report of a CT scan of the abdomen and pelvis without and with contrast done 03/30/2014 by [facility] which showed: a 7 mm left-sided bladder lesion worrisome for small recurrent or new neoplasm with cystoscopic evaluation recommended; numerous bilateral renal cysts but no worrisome renal lesions; and no mesenteric or retroperitoneal mass or adenopathy; there were no focal hepatic lesions or intrahepatic biliary dilatation; there was a small cyst associated with the right hepatic lobe posteriorly and inferiorly; the gallbladder was normal; there was mild stable common bile duct dilatation.  Patient underwent cystoscopy on 04/05/2014 which showed a 7 mm papillary bladder tumor of the left lateral wall and a smaller worrisome lesion in the trigone that were biopsied; there was also a mucosal abnormality at the left prostatic apex that was biopsied.  Dr. Jeffie Pollock plans to call patient with the biopsy results when they return; he also plans a three-month follow-up for cystoscopy unless the biopsy suggests a need for additional therapy.

## 2014-04-09 NOTE — Telephone Encounter (Signed)
Second attempt made to contact pt and inform him of  Chest CT scheduled for March 28th @ 9am here @ [facility] (no prep).  Pt will also need to have labs drawn (see note below).   Pt needs to be informed of why Chest CT (to follow-up two small right lung nodules on chest CT scan in 2011) and labs were ordered (see note below from pcp).    Please have patient return for additional blood work (hepatitis serology and TSH) to workup mild to moderately elevated alkaline phosphatase (a liver enzyme). These labs could be drawn when he comes back for his chest CT scan if convenient.)   Thanks,  J. Joines

## 2014-04-19 ENCOUNTER — Ambulatory Visit (HOSPITAL_COMMUNITY): Payer: Medicare Other

## 2014-05-07 ENCOUNTER — Ambulatory Visit (HOSPITAL_COMMUNITY)
Admission: RE | Admit: 2014-05-07 | Discharge: 2014-05-07 | Disposition: A | Discharge status: Home / Self Care | Payer: Medicare Other | Source: Ambulatory Visit | Attending: Internal Medicine | Admitting: Internal Medicine

## 2014-05-07 DIAGNOSIS — R918 Other nonspecific abnormal finding of lung field: Secondary | ICD-10-CM | POA: Diagnosis present

## 2014-05-10 ENCOUNTER — Telehealth: Payer: Self-pay | Admitting: Internal Medicine

## 2014-05-10 DIAGNOSIS — R918 Other nonspecific abnormal finding of lung field: Secondary | ICD-10-CM

## 2014-05-10 NOTE — Assessment & Plan Note (Signed)
Telephone Contact Note  I called patient and informed him of the CT scan result, which showed stable parenchymal nodules consistent with a benign etiology, with no further follow-up felt to be necessary by the radiologist.

## 2014-05-10 NOTE — Telephone Encounter (Signed)
I called patient and informed him of the CT scan result, which showed stable parenchymal nodules consistent with a benign etiology, with no further follow-up felt to be necessary by the radiologist.  Patient has not yet come in to have additional labs drawn to work up his elevated alkaline phosphatase.  I discussed this with him today and advised him to come in for those labs.

## 2014-05-10 NOTE — Progress Notes (Signed)
Quick Note:  I called patient and informed him of the CT scan result, which showed stable parenchymal nodules consistent with a benign etiology, with no further follow-up felt to be necessary by the radiologist. ______

## 2014-06-14 DIAGNOSIS — C672 Malignant neoplasm of lateral wall of bladder: Secondary | ICD-10-CM | POA: Diagnosis not present

## 2014-06-14 DIAGNOSIS — D090 Carcinoma in situ of bladder: Secondary | ICD-10-CM | POA: Diagnosis not present

## 2014-06-23 DIAGNOSIS — C672 Malignant neoplasm of lateral wall of bladder: Secondary | ICD-10-CM | POA: Diagnosis not present

## 2014-06-23 DIAGNOSIS — Z5111 Encounter for antineoplastic chemotherapy: Secondary | ICD-10-CM | POA: Diagnosis not present

## 2014-06-30 DIAGNOSIS — C672 Malignant neoplasm of lateral wall of bladder: Secondary | ICD-10-CM | POA: Diagnosis not present

## 2014-06-30 DIAGNOSIS — Z5111 Encounter for antineoplastic chemotherapy: Secondary | ICD-10-CM | POA: Diagnosis not present

## 2014-06-30 DIAGNOSIS — D090 Carcinoma in situ of bladder: Secondary | ICD-10-CM | POA: Diagnosis not present

## 2014-07-07 DIAGNOSIS — Z5111 Encounter for antineoplastic chemotherapy: Secondary | ICD-10-CM | POA: Diagnosis not present

## 2014-07-07 DIAGNOSIS — C672 Malignant neoplasm of lateral wall of bladder: Secondary | ICD-10-CM | POA: Diagnosis not present

## 2014-07-07 DIAGNOSIS — D090 Carcinoma in situ of bladder: Secondary | ICD-10-CM | POA: Diagnosis not present

## 2014-07-14 DIAGNOSIS — C672 Malignant neoplasm of lateral wall of bladder: Secondary | ICD-10-CM | POA: Diagnosis not present

## 2014-07-14 DIAGNOSIS — D090 Carcinoma in situ of bladder: Secondary | ICD-10-CM | POA: Diagnosis not present

## 2014-07-14 DIAGNOSIS — Z5111 Encounter for antineoplastic chemotherapy: Secondary | ICD-10-CM | POA: Diagnosis not present

## 2014-07-14 DIAGNOSIS — N401 Enlarged prostate with lower urinary tract symptoms: Secondary | ICD-10-CM | POA: Diagnosis not present

## 2014-07-14 DIAGNOSIS — Z8551 Personal history of malignant neoplasm of bladder: Secondary | ICD-10-CM | POA: Diagnosis not present

## 2014-07-21 DIAGNOSIS — C672 Malignant neoplasm of lateral wall of bladder: Secondary | ICD-10-CM | POA: Diagnosis not present

## 2014-07-21 DIAGNOSIS — Z5111 Encounter for antineoplastic chemotherapy: Secondary | ICD-10-CM | POA: Diagnosis not present

## 2014-07-21 DIAGNOSIS — D090 Carcinoma in situ of bladder: Secondary | ICD-10-CM | POA: Diagnosis not present

## 2014-07-21 DIAGNOSIS — R300 Dysuria: Secondary | ICD-10-CM | POA: Diagnosis not present

## 2014-08-04 DIAGNOSIS — Z5111 Encounter for antineoplastic chemotherapy: Secondary | ICD-10-CM | POA: Diagnosis not present

## 2014-08-04 DIAGNOSIS — D090 Carcinoma in situ of bladder: Secondary | ICD-10-CM | POA: Diagnosis not present

## 2014-10-07 ENCOUNTER — Emergency Department (HOSPITAL_COMMUNITY)
Admission: EM | Admit: 2014-10-07 | Discharge: 2014-10-07 | Disposition: A | Discharge status: Home / Self Care | Payer: Medicare Other | Attending: Emergency Medicine | Admitting: Emergency Medicine

## 2014-10-07 ENCOUNTER — Emergency Department (HOSPITAL_COMMUNITY): Payer: Medicare Other

## 2014-10-07 ENCOUNTER — Encounter (HOSPITAL_COMMUNITY): Payer: Self-pay | Admitting: Emergency Medicine

## 2014-10-07 DIAGNOSIS — I4891 Unspecified atrial fibrillation: Secondary | ICD-10-CM | POA: Diagnosis not present

## 2014-10-07 DIAGNOSIS — Z8639 Personal history of other endocrine, nutritional and metabolic disease: Secondary | ICD-10-CM | POA: Diagnosis not present

## 2014-10-07 DIAGNOSIS — M179 Osteoarthritis of knee, unspecified: Secondary | ICD-10-CM | POA: Diagnosis not present

## 2014-10-07 DIAGNOSIS — Z87438 Personal history of other diseases of male genital organs: Secondary | ICD-10-CM | POA: Insufficient documentation

## 2014-10-07 DIAGNOSIS — M7989 Other specified soft tissue disorders: Secondary | ICD-10-CM | POA: Insufficient documentation

## 2014-10-07 DIAGNOSIS — I10 Essential (primary) hypertension: Secondary | ICD-10-CM | POA: Diagnosis not present

## 2014-10-07 DIAGNOSIS — Z973 Presence of spectacles and contact lenses: Secondary | ICD-10-CM | POA: Insufficient documentation

## 2014-10-07 DIAGNOSIS — Z8719 Personal history of other diseases of the digestive system: Secondary | ICD-10-CM | POA: Insufficient documentation

## 2014-10-07 DIAGNOSIS — Z87891 Personal history of nicotine dependence: Secondary | ICD-10-CM | POA: Insufficient documentation

## 2014-10-07 DIAGNOSIS — Z8551 Personal history of malignant neoplasm of bladder: Secondary | ICD-10-CM | POA: Insufficient documentation

## 2014-10-07 DIAGNOSIS — Z87448 Personal history of other diseases of urinary system: Secondary | ICD-10-CM | POA: Diagnosis not present

## 2014-10-07 DIAGNOSIS — Z7982 Long term (current) use of aspirin: Secondary | ICD-10-CM | POA: Diagnosis not present

## 2014-10-07 DIAGNOSIS — Z9119 Patient's noncompliance with other medical treatment and regimen: Secondary | ICD-10-CM | POA: Insufficient documentation

## 2014-10-07 DIAGNOSIS — R000 Tachycardia, unspecified: Secondary | ICD-10-CM | POA: Diagnosis not present

## 2014-10-07 DIAGNOSIS — Z9114 Patient's other noncompliance with medication regimen: Secondary | ICD-10-CM

## 2014-10-07 DIAGNOSIS — M6289 Other specified disorders of muscle: Secondary | ICD-10-CM | POA: Diagnosis not present

## 2014-10-07 DIAGNOSIS — Z8669 Personal history of other diseases of the nervous system and sense organs: Secondary | ICD-10-CM | POA: Diagnosis not present

## 2014-10-07 DIAGNOSIS — J90 Pleural effusion, not elsewhere classified: Secondary | ICD-10-CM | POA: Diagnosis not present

## 2014-10-07 LAB — CBC WITH DIFFERENTIAL/PLATELET
BASOS ABS: 0 10*3/uL (ref 0.0–0.1)
Basophils Relative: 1 %
EOS ABS: 0 10*3/uL (ref 0.0–0.7)
EOS PCT: 1 %
HCT: 47.9 % (ref 39.0–52.0)
Hemoglobin: 16.7 g/dL (ref 13.0–17.0)
LYMPHS ABS: 1 10*3/uL (ref 0.7–4.0)
Lymphocytes Relative: 28 %
MCH: 32.1 pg (ref 26.0–34.0)
MCHC: 34.9 g/dL (ref 30.0–36.0)
MCV: 92.1 fL (ref 78.0–100.0)
MONO ABS: 0.4 10*3/uL (ref 0.1–1.0)
Monocytes Relative: 10 %
Neutro Abs: 2.3 10*3/uL (ref 1.7–7.7)
Neutrophils Relative %: 62 %
PLATELETS: 146 10*3/uL — AB (ref 150–400)
RBC: 5.2 MIL/uL (ref 4.22–5.81)
RDW: 15.1 % (ref 11.5–15.5)
WBC: 3.8 10*3/uL — AB (ref 4.0–10.5)

## 2014-10-07 LAB — COMPREHENSIVE METABOLIC PANEL
ALK PHOS: 166 U/L — AB (ref 38–126)
ALT: 40 U/L (ref 17–63)
AST: 81 U/L — ABNORMAL HIGH (ref 15–41)
Albumin: 3 g/dL — ABNORMAL LOW (ref 3.5–5.0)
Anion gap: 11 (ref 5–15)
BILIRUBIN TOTAL: 2.6 mg/dL — AB (ref 0.3–1.2)
BUN: 22 mg/dL — ABNORMAL HIGH (ref 6–20)
CALCIUM: 8.8 mg/dL — AB (ref 8.9–10.3)
CO2: 21 mmol/L — ABNORMAL LOW (ref 22–32)
CREATININE: 1.37 mg/dL — AB (ref 0.61–1.24)
Chloride: 104 mmol/L (ref 101–111)
GFR, EST AFRICAN AMERICAN: 56 mL/min — AB (ref >60)
GFR, EST NON AFRICAN AMERICAN: 49 mL/min — AB (ref >60)
Glucose, Bld: 96 mg/dL (ref 65–99)
Potassium: 5.6 mmol/L — ABNORMAL HIGH (ref 3.5–5.1)
Sodium: 136 mmol/L (ref 135–145)
Total Protein: 7.6 g/dL (ref 6.5–8.1)

## 2014-10-07 LAB — BRAIN NATRIURETIC PEPTIDE: B NATRIURETIC PEPTIDE 5: 1596.1 pg/mL — AB (ref 0.0–100.0)

## 2014-10-07 MED ORDER — FUROSEMIDE 40 MG PO TABS: 40.0000 mg | ORAL_TABLET | Freq: Every day | ORAL | Status: DC

## 2014-10-07 MED ORDER — AMLODIPINE BESYLATE 5 MG PO TABS: 5.0000 mg | ORAL_TABLET | Freq: Every day | ORAL | Status: DC

## 2014-10-07 MED ORDER — AMLODIPINE BESYLATE 5 MG PO TABS
5.0000 mg | ORAL_TABLET | Freq: Once | ORAL | Status: AC
  Administered 2014-10-07: 5 mg via ORAL
  Filled 2014-10-07: qty 1

## 2014-10-07 MED ORDER — DILTIAZEM HCL 25 MG/5ML IV SOLN
10.0000 mg | Freq: Once | INTRAVENOUS | Status: AC
  Administered 2014-10-07: 10 mg via INTRAVENOUS
  Filled 2014-10-07: qty 5

## 2014-10-07 MED ORDER — FUROSEMIDE 10 MG/ML IJ SOLN
40.0000 mg | Freq: Once | INTRAMUSCULAR | Status: AC
  Administered 2014-10-07: 40 mg via INTRAVENOUS
  Filled 2014-10-07: qty 4

## 2014-10-07 NOTE — ED Notes (Signed)
Pt reports not taking his home medications for the last 2 months. States his doctor retired and hasn't refilled medications. Notes bilateral lower leg swelling for the last 2 weeks.

## 2014-10-07 NOTE — Discharge Instructions (Signed)
You were evaluated in the ED for your leg swelling. It is important for you to take your medications as prescribed. It is also important for you to follow-up with internal medicine and to resume primary care and for medication reconciliation. Return to ED for new or worsening symptoms.  Peripheral Edema You have swelling in your legs (peripheral edema). This swelling is due to excess accumulation of salt and water in your body. Edema may be a sign of heart, kidney or liver disease, or a side effect of a medication. It may also be due to problems in the leg veins. Elevating your legs and using special support stockings may be very helpful, if the cause of the swelling is due to poor venous circulation. Avoid long periods of standing, whatever the cause. Treatment of edema depends on identifying the cause. Chips, pretzels, pickles and other salty foods should be avoided. Restricting salt in your diet is almost always needed. Water pills (diuretics) are often used to remove the excess salt and water from your body via urine. These medicines prevent the kidney from reabsorbing sodium. This increases urine flow. Diuretic treatment may also result in lowering of potassium levels in your body. Potassium supplements may be needed if you have to use diuretics daily. Daily weights can help you keep track of your progress in clearing your edema. You should call your caregiver for follow up care as recommended. SEEK IMMEDIATE MEDICAL CARE IF:   You have increased swelling, pain, redness, or heat in your legs.  You develop shortness of breath, especially when lying down.  You develop chest or abdominal pain, weakness, or fainting.  You have a fever. Document Released: 02/16/2004 Document Revised: 04/02/2011 Document Reviewed: 01/26/2009 [facility] Patient Information 2015 [place], [place]. This information is not intended to replace advice given to you by your health care provider. Make sure you discuss any  questions you have with your health care provider.

## 2014-10-07 NOTE — ED Provider Notes (Signed)
CSN: [id number]     Arrival date & time 10/07/14  1233 History   First MD Initiated Contact with Patient 10/07/14 1526     Chief Complaint  Patient presents with  . Leg Swelling     (Consider location/radiation/quality/duration/timing/severity/associated sxs/prior Treatment) HPI Tommy Morris is a 74 y.o. male history of hypertension, chronic A. fib, comes in for evaluation of leg swelling. Patient states his family doctor retired 2 months ago and he has not followed up with a new doctor or refilled his medications. He reports over the past month he has been increasingly tired and has noticed more swelling in his bilateral feet. He denies any fevers, chills, chest pain or shortness of breath, nausea or vomiting, PND. Denies any discomfort now in the ED. States he prefers not to be admitted to the hospital.  Past Medical History  Diagnosis Date  . Hypertension   . Diverticulosis of colon   . Pulmonary nodule     Two small right lung nodules on chest CT 04/25/2009  . Elevated PSA   . Bladder tumor   . History of bladder cancer     2006  S/P TURBT  UROTHELIAL CARCINOMA  . History of urinary retention     LAST ISSUE 11/2012 W/ ARF  . Hyperlipidemia   . Tinnitus of right ear     CHRONIC  . Bilateral renal cysts     SIMPLE  . History of gout   . BPH (benign prostatic hypertrophy) with urinary obstruction   . Atrial fibrillation chronic     echo 05/03/2009 mildly to moderately reduced LV systolic function ef 54-00%  Patient declines anticoagulation for stroke prophylaxis.  . Mild mitral valve prolapse   . Mitral valve regurgitation   . OA (osteoarthritis) of knee   . Wears glasses    Past Surgical History  Procedure Laterality Date  . Transurethral resection of bladder tumor  10-26-2004    PLACEMENT RIGHT URETERAL STENT AND TRANSRECTAL PROSTATE BX  . Transthoracic echocardiogram  05-03-2009    DIFFUSE HYPOKINESIS/ EF 40-45%/  MILD MVP WITH MILD TO MODERATE MR/  MILDLY DILATED LA &  RA  . Tonsillectomy  AGE 10  . Cystoscopy with retrograde pyelogram, ureteroscopy and stent placement Bilateral 01/06/2013    Procedure: CYSTOSCOPY WITH BILATERAL RETROGRADE PYELOGRAM, ;  Surgeon: Irine Seal, MD;  Location: [facility];  Service: Urology;  Laterality: Bilateral;  . Transurethral resection of bladder tumor with gyrus (turbt-gyrus) N/A 01/06/2013    Procedure: TRANSURETHRAL RESECTION OF BLADDER TUMOR WITH GYRUS (TURBT-GYRUS);  Surgeon: Irine Seal, MD;  Location: [facility];  Service: Urology;  Laterality: N/A;  . Transurethral resection of prostate N/A 01/06/2013    Procedure: TRANSURETHRAL RESECTION OF THE PROSTATE (TURP);  Surgeon: Irine Seal, MD;  Location: [facility];  Service: Urology;  Laterality: N/A;   Family History  Problem Relation Age of Onset  . Alzheimer's disease Mother     Died in 30 at age 55.  . Cancer Neg Hx   . Diabetes Neg Hx   . Hypertension Neg Hx   . Heart disease Neg Hx    Social History  Substance Use Topics  . Smoking status: Former Smoker -- 1.00 packs/day for 50 years    Types: Cigarettes    Quit date: 01/22/2002  . Smokeless tobacco: Former Systems developer    Types: [place] date: 01/23/1964  . Alcohol Use: 10.5 oz/week    21 drink(s) per  week     Comment: Average of about 3 drinks per day.    Review of Systems A 10 point review of systems was completed and was negative except for pertinent positives and negatives as mentioned in the history of present illness     Allergies  Review of patient's allergies indicates no known allergies.  Home Medications   Prior to Admission medications   Medication Sig Start Date End Date Taking? Authorizing Provider  aspirin 81 MG EC tablet Take 1 tablet (81 mg total) by mouth daily. 02/03/14  Yes Bertha Stakes, MD  amLODipine (NORVASC) 5 MG tablet Take 1 tablet (5 mg total) by mouth daily. 10/07/14   Comer Locket, PA-C  benazepril (LOTENSIN) 40 MG  tablet Take 1 tablet (40 mg total) by mouth daily. Patient not taking: Reported on 10/07/2014 02/03/14   Bertha Stakes, MD  furosemide (LASIX) 40 MG tablet Take 1 tablet (40 mg total) by mouth daily. 10/07/14   Comer Locket, PA-C  potassium chloride (K-DUR,KLOR-CON) 10 MEQ tablet Take 1 tablet (10 mEq total) by mouth daily. Patient not taking: Reported on 04/07/2014 02/03/14   Bertha Stakes, MD   BP 120/99 mmHg  Pulse 86  Temp(Src) 98 F (36.7 C) (Oral)  Resp 10  SpO2 97% Physical Exam  Constitutional: He is oriented to person, place, and time. He appears well-developed and well-nourished.  HENT:  Head: Normocephalic and atraumatic.  Mouth/Throat: Oropharynx is clear and moist.  Eyes: Conjunctivae are normal. Pupils are equal, round, and reactive to light. Right eye exhibits no discharge. Left eye exhibits no discharge. No scleral icterus.  Neck: Normal range of motion. Neck supple.  Cardiovascular: Normal heart sounds.   Tachycardic, irregularly irregular.  Pulmonary/Chest: Effort normal and breath sounds normal. No respiratory distress. He has no wheezes. He has no rales.  Abdominal: Soft. There is no tenderness.  Musculoskeletal: Normal range of motion. He exhibits edema. He exhibits no tenderness.  2+ pretibial pedal edema bilaterally.  Neurological: He is alert and oriented to person, place, and time.  Cranial Nerves II-XII grossly intact  Skin: Skin is warm and dry. No rash noted.  Psychiatric: He has a normal mood and affect.  Nursing note and vitals reviewed.   ED Course  Procedures (including critical care time) Labs Review Labs Reviewed  COMPREHENSIVE METABOLIC PANEL - Abnormal; Notable for the following:    Potassium 5.6 (*)    CO2 21 (*)    BUN 22 (*)    Creatinine, Ser 1.37 (*)    Calcium 8.8 (*)    Albumin 3.0 (*)    AST 81 (*)    Alkaline Phosphatase 166 (*)    Total Bilirubin 2.6 (*)    GFR calc non Af Amer 49 (*)    GFR calc Af Amer 56 (*)    All other  components within normal limits  BRAIN NATRIURETIC PEPTIDE - Abnormal; Notable for the following:    B Natriuretic Peptide 1596.1 (*)    All other components within normal limits  CBC WITH DIFFERENTIAL/PLATELET - Abnormal; Notable for the following:    WBC 3.8 (*)    Platelets 146 (*)    All other components within normal limits    Imaging Review Dg Chest 2 View  10/07/2014   CLINICAL DATA:  Leg swelling for 2 weeks.  EXAM: CHEST  2 VIEW  COMPARISON:  None.  FINDINGS: There is cardiomegaly. Small bilateral pleural effusions. No confluent airspace opacities or edema. No acute bony abnormality.  IMPRESSION: Cardiomegaly.  Small bilateral effusions.   Electronically Signed   By: Rolm Baptise M.D.   On: 10/07/2014 16:12   I have personally reviewed and evaluated these images and lab results as part of my medical decision-making.   EKG Interpretation   Date/Time:  Thursday October 07 2014 15:37:44 EDT Ventricular Rate:  135 PR Interval:    QRS Duration: 86 QT Interval:  320 QTC Calculation: 480 R Axis:   115 Text Interpretation:  Atrial fibrillation Probable lateral infarct, age  indeterminate Anterior infarct, old Nonspecific T abnormalities, lateral  leads Confirmed by PICKERING  MD, NATHAN [phone]) on 10/07/2014 3:56:21 PM     Meds given in ED:  Medications  diltiazem (CARDIZEM) injection 10 mg (10 mg Intravenous Given 10/07/14 1617)  furosemide (LASIX) injection 40 mg (40 mg Intravenous Given 10/07/14 1617)  amLODipine (NORVASC) tablet 5 mg (5 mg Oral Given 10/07/14 1731)    Discharge Medication List as of 10/07/2014  6:44 PM     Filed Vitals:   10/07/14 1745 10/07/14 1800 10/07/14 1900 10/07/14 1911  BP: 124/106 138/90 120/99   Pulse: 38 61 86   Temp:    98 F (36.7 C)  TempSrc:    Oral  Resp: 22 23 10    SpO2: 97% 99% 97%     MDM  JOMEL WHITTLESEY is a 74 y.o. male with a history of hypertension, chronic A. fib and other medical problems comes in for evaluation of leg  swelling after being off of his medications for the past 2 months. Patient comes into the ED and original EKG shows A. fib with RVR. Patient's chads Vasc score is 3, however upon review of previous notes, patient has refused anticoagulation therapy. On exam, patient has normal lung sounds, irregularly irregular rhythm on auscultation with normal heart sounds, bilateral pedal edema. Otherwise unremarkable exam Patient given 10 mg Cardizem IV in the ED as well as 40 mg Lasix. Patient's heart rate responded well and is now mid 80s. Patient remains asymptomatic and states he feels well. Will add patient's home dose of amlodipine. Labs-patient has potassium of 5.6 likely secondary to cessation of Lasix. Creatinine is 1.37 up from baseline of 0.9, BNP 1596, however benign pulmonary exam and no baseline to compare value to. Chest x-ray shows cardiomegaly with mild bilateral effusions. No evidence of gross fluid overload. States he does not want to be admitted to hospital.   Overall, patient appears well and stable for discharge. He will need to follow-up with his PCP/internal medicine in order to resume care and for medication reconciliation. Given prescription for amlodipine and furosemide. No evidence of acute heart failure, emergent arrhythmia, or other pathology that requires immediate intervention. I discussed all relevant lab findings and imaging results with pt and they verbalized understanding. Discussed f/u with PCP within 48 hrs and return precautions, pt very amenable to plan. Prior to patient discharge, I discussed and reviewed this case with Dr.Pickering , who also saw and evaluated the patient.  Final diagnoses:  Leg swelling  Noncompliance with medications        Comer Locket, PA-C 10/07/14 2014  Davonna Belling, MD 10/08/14 [phone]

## 2014-10-21 ENCOUNTER — Emergency Department (HOSPITAL_COMMUNITY)
Admission: EM | Admit: 2014-10-21 | Discharge: 2014-10-21 | Disposition: A | Discharge status: Home / Self Care | Payer: Medicare Other

## 2014-10-21 NOTE — ED Notes (Signed)
Pt is here just to get blood pressure checked. Doesn't want to see doctor. Pt denies pain, wants to leave and go downstairs to get doctor. Does not want to see a doctor and wants to leave. His BP was 111/84

## 2014-10-27 ENCOUNTER — Ambulatory Visit (INDEPENDENT_AMBULATORY_CARE_PROVIDER_SITE_OTHER): Payer: Medicare Other | Admitting: Internal Medicine

## 2014-10-27 ENCOUNTER — Encounter: Payer: Self-pay | Admitting: Internal Medicine

## 2014-10-27 VITALS — BP 122/94 | HR 119 | Temp 97.5°F | Ht 73.0 in | Wt 159.4 lb

## 2014-10-27 DIAGNOSIS — I4891 Unspecified atrial fibrillation: Secondary | ICD-10-CM

## 2014-10-27 DIAGNOSIS — F1020 Alcohol dependence, uncomplicated: Secondary | ICD-10-CM

## 2014-10-27 DIAGNOSIS — I10 Essential (primary) hypertension: Secondary | ICD-10-CM | POA: Diagnosis not present

## 2014-10-27 DIAGNOSIS — R748 Abnormal levels of other serum enzymes: Secondary | ICD-10-CM

## 2014-10-27 DIAGNOSIS — F1010 Alcohol abuse, uncomplicated: Secondary | ICD-10-CM

## 2014-10-27 DIAGNOSIS — I482 Chronic atrial fibrillation, unspecified: Secondary | ICD-10-CM

## 2014-10-27 MED ORDER — METOPROLOL TARTRATE 25 MG PO TABS: 25.0000 mg | ORAL_TABLET | Freq: Once | ORAL | Status: DC

## 2014-10-27 NOTE — Progress Notes (Signed)
   Subjective:    Patient ID: Erin Hearing, male    DOB: 05/28/1940, 74 y.o.   MRN: [id number]  HPI Mr. Castilleja is a 74 year old male with retention, atrial fibrillation, history of alcohol abuse, pulmonary nodules, malignant neoplasm of bladder who presents today for follow-up visit. Please see assessment & plan for documentation of chronic medical problems.    Review of Systems  Respiratory: Negative for shortness of breath.   Cardiovascular: Negative for chest pain and leg swelling.  Gastrointestinal: Negative for nausea, vomiting, abdominal pain, diarrhea and blood in stool.  Genitourinary: Negative for dysuria.  Neurological: Negative for dizziness.       Objective:   Physical Exam Constitutional: Elderly, thin African American male. No distress.  Head: Normocephalic and atraumatic.  Eyes: Conjunctivae are normal. Pupils are 4 mm, direct, consensual, near.  Cardiovascular: Irregular heart rate.  No gallop, friction rub, murmur heard. Pulmonary/Chest: Effort normal. No respiratory distress. No wheezes, rales.  Abdominal: Soft. Bowel sounds are normal. No distension. No tenderness.  Neurological: Alert and oriented to person, place, and time. Coordination normal.  Skin: Warm and dry. Not diaphoretic.  Extremities: 2+ pitting edema noted bilaterally. Psychiatric: Affect appropriate.     Assessment & Plan:

## 2014-10-27 NOTE — Progress Notes (Signed)
Medicine attending: Medical history, presenting problems, physical findings, and medications, reviewed with Dr Rushil Patel and I concur with his evaluation and management plan. 

## 2014-10-27 NOTE — Assessment & Plan Note (Signed)
We will recheck a CMET today.

## 2014-10-27 NOTE — Assessment & Plan Note (Addendum)
Blood pressure today is 122/64. He cannot recall the names of the medications he is currently taking, but he has brought with him paperwork from his most recent ED visit last month which lists amlodipine 5 mg and Lasix 40 mg. He was frustrated that he was not informed his PCP was retiring and was thus unable to collect his refills. He denies any chest pain, shortness of breath, headache, changes in energy.  As his most recent BMET in the emergency department was abnormal, benazepril was not restarted pending confirmation at his values had corrected. He is due for a one-month follow-up with his PCP and was advised to bring in all of his medications to ensure that he is taking the correct agents.  ADDENDUM 10/29/2014  4:06 PM:  K 2.9. Will need supplement so called patient but got no answer. Will send to pharmacy.

## 2014-10-27 NOTE — Patient Instructions (Addendum)
Please bring your medicines with you each time you come to clinic.  Medicines may include prescription medications, over-the-counter medications, herbal remedies, eye drops, vitamins, or other pills.  Please follow-up next month with Dr. Evette Doffing and bring in your medications so that we can make sure we are treating your blood pressure and heart rate appropriately.  We are starting metoprolol 25mg  daily just to make sure your heart rate is better controlled.    Progress Toward Treatment Goals:  Treatment Goal 04/07/2014  Blood pressure at goal    Self Care Goals & Plans:  Self Care Goal 10/27/2014  Manage my medications take my medicines as prescribed; bring my medications to every visit; refill my medications on time; follow the sick day instructions if I am sick  Eat healthy foods eat more vegetables; eat fruit for snacks and desserts; eat foods that are low in salt  Be physically active find an activity I enjoy  Meeting treatment goals -    No flowsheet data found.   Care Management & Community Referrals:  Referral 04/07/2014  Referrals made for care management support none needed  Referrals made to community resources none

## 2014-10-27 NOTE — Assessment & Plan Note (Addendum)
He reports drinking 1-2 bottles of brandy daily though avoids consuming it prior to driving. He is not interested in reducing his alcohol intake as reports drinking since the age of 4 due to the influence of his grandfather. He denies the concomitant use of illicit drugs, like cocaine, heroin, or marijuana.

## 2014-10-27 NOTE — Assessment & Plan Note (Addendum)
Heart rate in the office today was 119. We discussed the risks and benefits of anticoagulation, he again declined to medication understanding that there was a risk of a stroke. He felt that he had not had a stroke in the last 15 years and was thus in good shape. He denied any palpitations or shortness of breath following his visit to the emergency department.  Given that he had presented to the emergency department with RVR last month, we restarted metoprolol 25 mg.

## 2014-10-28 LAB — CMP14 + ANION GAP
A/G RATIO: 0.9 — AB (ref 1.1–2.5)
ALT: 22 IU/L (ref 0–44)
ANION GAP: 21 mmol/L — AB (ref 10.0–18.0)
AST: 38 IU/L (ref 0–40)
Albumin: 3.4 g/dL — ABNORMAL LOW (ref 3.5–4.8)
Alkaline Phosphatase: 203 IU/L — ABNORMAL HIGH (ref 39–117)
BUN/Creatinine Ratio: 20 (ref 10–22)
BUN: 16 mg/dL (ref 8–27)
Bilirubin Total: 1.7 mg/dL — ABNORMAL HIGH (ref 0.0–1.2)
CALCIUM: 8.8 mg/dL (ref 8.6–10.2)
CO2: 23 mmol/L (ref 18–29)
CREATININE: 0.81 mg/dL (ref 0.76–1.27)
Chloride: 97 mmol/L (ref 97–108)
GFR, EST AFRICAN AMERICAN: 100 mL/min/{1.73_m2} (ref >59)
GFR, EST NON AFRICAN AMERICAN: 86 mL/min/{1.73_m2} (ref >59)
GLUCOSE: 87 mg/dL (ref 65–99)
Globulin, Total: 3.7 g/dL (ref 1.5–4.5)
POTASSIUM: 2.9 mmol/L — AB (ref 3.5–5.2)
Sodium: 141 mmol/L (ref 134–144)
TOTAL PROTEIN: 7.1 g/dL (ref 6.0–8.5)

## 2014-10-29 MED ORDER — POTASSIUM CHLORIDE CRYS ER 20 MEQ PO TBCR: 20.0000 meq | EXTENDED_RELEASE_TABLET | Freq: Every day | ORAL | Status: DC

## 2014-10-29 NOTE — Progress Notes (Signed)
Pt aware of results and Rx is at pharmacy.

## 2014-10-29 NOTE — Addendum Note (Signed)
Addended by: Riccardo Dubin on: 10/29/2014 04:07 PM   Modules accepted: Orders

## 2014-11-29 ENCOUNTER — Ambulatory Visit: Payer: Medicare Other | Admitting: Student in an Organized Health Care Education/Training Program

## 2014-11-29 ENCOUNTER — Encounter: Payer: Self-pay | Admitting: Student in an Organized Health Care Education/Training Program

## 2014-12-04 ENCOUNTER — Other Ambulatory Visit: Payer: Self-pay | Admitting: Internal Medicine

## 2015-02-17 DIAGNOSIS — D090 Carcinoma in situ of bladder: Secondary | ICD-10-CM | POA: Diagnosis not present

## 2015-02-17 DIAGNOSIS — Z0000 Encounter for general adult medical examination without abnormal findings: Secondary | ICD-10-CM | POA: Diagnosis not present

## 2015-03-25 ENCOUNTER — Telehealth: Payer: Self-pay | Admitting: Student in an Organized Health Care Education/Training Program

## 2015-03-25 NOTE — Telephone Encounter (Signed)
APPT. REMINDER CALL, LMTCB IF HE NEEDS TO CANCEL °

## 2015-03-28 ENCOUNTER — Ambulatory Visit (INDEPENDENT_AMBULATORY_CARE_PROVIDER_SITE_OTHER): Payer: Medicare Other | Admitting: Student in an Organized Health Care Education/Training Program

## 2015-03-28 ENCOUNTER — Telehealth: Payer: Self-pay | Admitting: Student in an Organized Health Care Education/Training Program

## 2015-03-28 VITALS — BP 114/84 | HR 53 | Temp 97.7°F | Ht 73.0 in | Wt 152.4 lb

## 2015-03-28 DIAGNOSIS — Z7982 Long term (current) use of aspirin: Secondary | ICD-10-CM | POA: Diagnosis not present

## 2015-03-28 DIAGNOSIS — I110 Hypertensive heart disease with heart failure: Secondary | ICD-10-CM

## 2015-03-28 DIAGNOSIS — Z79899 Other long term (current) drug therapy: Secondary | ICD-10-CM | POA: Diagnosis not present

## 2015-03-28 DIAGNOSIS — I5022 Chronic systolic (congestive) heart failure: Secondary | ICD-10-CM | POA: Insufficient documentation

## 2015-03-28 DIAGNOSIS — I482 Chronic atrial fibrillation, unspecified: Secondary | ICD-10-CM

## 2015-03-28 DIAGNOSIS — I4891 Unspecified atrial fibrillation: Secondary | ICD-10-CM | POA: Diagnosis present

## 2015-03-28 DIAGNOSIS — Z7189 Other specified counseling: Secondary | ICD-10-CM

## 2015-03-28 DIAGNOSIS — IMO0002 Reserved for concepts with insufficient information to code with codable children: Secondary | ICD-10-CM

## 2015-03-28 DIAGNOSIS — I10 Essential (primary) hypertension: Secondary | ICD-10-CM

## 2015-03-28 DIAGNOSIS — F1020 Alcohol dependence, uncomplicated: Secondary | ICD-10-CM | POA: Diagnosis not present

## 2015-03-28 MED ORDER — ASPIRIN 81 MG PO TBEC: 81.0000 mg | DELAYED_RELEASE_TABLET | Freq: Every day | ORAL | Status: DC

## 2015-03-28 MED ORDER — METOPROLOL TARTRATE 25 MG PO TABS: 25.0000 mg | ORAL_TABLET | Freq: Two times a day (BID) | ORAL | Status: DC

## 2015-03-28 MED ORDER — RIVAROXABAN 20 MG PO TABS: 20.0000 mg | ORAL_TABLET | Freq: Every day | ORAL | Status: DC

## 2015-03-28 NOTE — Telephone Encounter (Signed)
I called Tommy Morris about his med reconcilliation. He tells me he takes Amlodipine 5, Metoprolol 25, and furosemide 20 every day. I told him the copay for xarelto and indeed it is too expensive for him. He does not want to use coumadin because of the frequency in lab checks it would require. I advised he not use furosemide every day, rather just as needed for leg swelling. We will continue Amlodipine, Metoprolol, and Aspirin for his medication regimen and follow him up in 3 months.

## 2015-03-28 NOTE — Assessment & Plan Note (Signed)
BP well controlled today. There is confusion about what medicines he is taking at home. I think it is fine to continue his current regimen, which I think is Amlodipine 5mg  daily and Benazepril 40mg  daily. I asked him to call our clinic this afternoon and tell us what pill bottles he has and what medicines he is taking.

## 2015-03-28 NOTE — Assessment & Plan Note (Addendum)
Reduced EF documented on Echo in 2011 to be 40-45% with diffuse hypokinesis. It do not see an ischemic evaluation in Epic; it is just as likely to be due to alcohol use. He has intermittently used loop diuretics for volume control. Plan is to continue benazepril, he is too bradycardic today to restart metoprolol. Continue aspirin for now. He appears euvolemic today, and we will follow up with him to see how much furosemide he uses in a week for LE swelling.

## 2015-03-28 NOTE — Assessment & Plan Note (Addendum)
Historically on aspirin alone for stroke prophylaxis because he has declined anticoagulation. We spent a lot of time talking about ways to reduce his risk of stroke. Like everyone else I strongly encouraged him to switch to anticoagulation for the best chance at preventing stroke. Since his wife passed away from a stroke in the last year I think he is now more receptive. He says that he is willing to use a blood thinner medication. I planned to prescribe Rivaroxaban, then was called by the pharmacy that the patient has no prescription drug coverage and co pay would be $460. At this point I will offer him coumadin, but knowing how minimal he is with his health care I doubt he will want to come for monthly INR checks. I will call him with this option but continue aspirin 81 mg daily for now.   He has had rapid ventricular response in the past, but denies any recent palpitations. He decided not to start Metoprolol as suggested at last clinic visit. Heart rate today is 53 bpm, so I think it is fine to stay off of beta blocker for now.

## 2015-03-28 NOTE — Patient Instructions (Signed)
1. Stop taking aspirin  2. Start taking Xarelto one tablet a day, this is the stronger blood thinner that will lower your risk of a stroke more than aspirin would.   3. Call us at the clinic and ask for [facility]. Tell her what medicines you are taking at home so we have it right in our system.

## 2015-03-28 NOTE — Assessment & Plan Note (Signed)
Drinks on a daily basis. He has had elevated alk phos and total bilirubin in the past, suggestive of early cirrhosis. No other signs of cirrhosis on exam today. He is not interested in reducing alcohol intake or naltrexone therapy. I think we will have to monitor this closely and manage problems that come up in the future. Ideally we would get an abdominal ultrasound to look at the liver and spleen size, then refer for endoscopy to screen for varices, but he has declined these interventions for now.

## 2015-03-28 NOTE — Assessment & Plan Note (Signed)
We spent greater than five minutes today talking about goals of care and advanced directives. The patient is a minimalist regarding his healthcare, wants to take the fewest medications possible and avoids diagnostic tests unless they are absolutely essential. He does not have a HCPOA or living will. Does not want to make these documents formally. Tells me that he would accept hospitalization including aggressive measures like intubation. Says he wants "anything to keep me alive." He says he wants his oldest daughter to make his medical decisions if he cannot. His wife had a prolonged ICU course after a large stroke and eventually had care withdrawn. I asked if he would want anything different for himself, and he said "I don't know."

## 2015-03-28 NOTE — Progress Notes (Signed)
   See Encounters tab for problem-based medical decision making  __________________________________________________________  HPI:  75 year old man here for follow-up of hypertension. Patient reports that he takes only 3 medications, but did not remember which and did not bring his pill bottles. I tried to reconcile his medicines within Epic but he does not remember the names of the medicine he takes. Doesn't check his blood pressures at home. Says that he has been compliant with the 3 meds he does take an denies any recent side effects. At his last clinic visits he was prescribed metoprolol for his atrial fibrillation with occasional rapid rates. He says he took 1 tablet of this, felt fatigued and then didn't take anymore. Denies any recent hospitalizations. No chest pain or dyspnea on exertion. Says he lives at home by himself. His wife passed away from a major stroke. He drinks alcohol every day and then he says he sleeps for most of the day. Does report that he has 3 daughters who live in [place]. Says he is eating and drinking well. Denies any lower extremity swelling. Says he occasionally takes a water pill. Reports that he is still losing weight.  __________________________________________________________  Problem List: Patient Active Problem List   Diagnosis Date Noted  . Counseling regarding advanced directives and goals of care 03/28/2015  . Chronic systolic heart failure ([place]) 03/28/2015  . Alcohol use disorder ([place]) 05/04/2009  . Atrial fibrillation ([place]) 04/27/2009  . Malignant neoplasm of bladder ([place]) 02/01/2006  . Essential hypertension 02/01/2006    Medications: Reconciled today in Epic __________________________________________________________  Physical Exam:  Vital Signs: Filed Vitals:   03/28/15 0856  BP: 114/84  Pulse: 53  Temp: 97.7 F (36.5 C)  TempSrc: Oral  Height: 6\' 1"  (1.854 m)  Weight: 152 lb 6.4 oz (69.128 kg)  SpO2: 100%    Gen: Chronically ill  appearing man, no distress ENT: OP clear without erythema or exudate, poor dentition.  Neck: No cervical LAD, No thyromegaly or nodules, No JVD. CV: bradycardic and irregular, no murmurs Pulm: Normal effort, CTA throughout, no wheezing Abd: Soft, NT, ND, normal BS.  Ext: Warm, no edema, normal joints Skin: Both legs have chronic stasis changes to the knees.

## 2015-04-24 IMAGING — US US RENAL
1 series · 13 of 25 positions shown · non-contrast
Comparison: 04/24/2009

CLINICAL DATA: Urinary retention.  Hypertension.

EXAM:
RENAL/URINARY TRACT ULTRASOUND COMPLETE

[Series 1: us renal · 0.21mm/px · 13 of 53 slices shown]
[im 1/53]
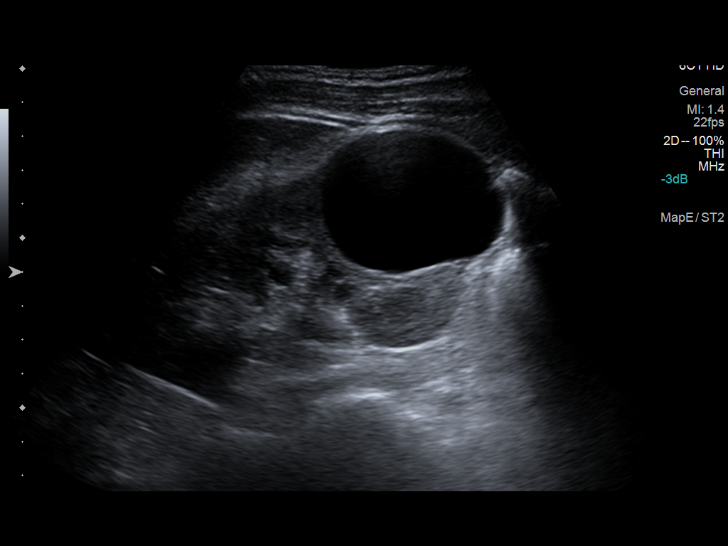
[im 5/53]
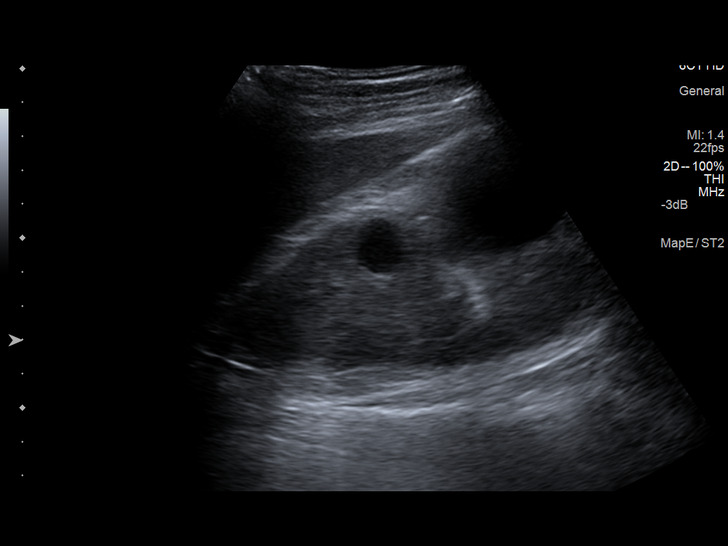
[im 9/53]
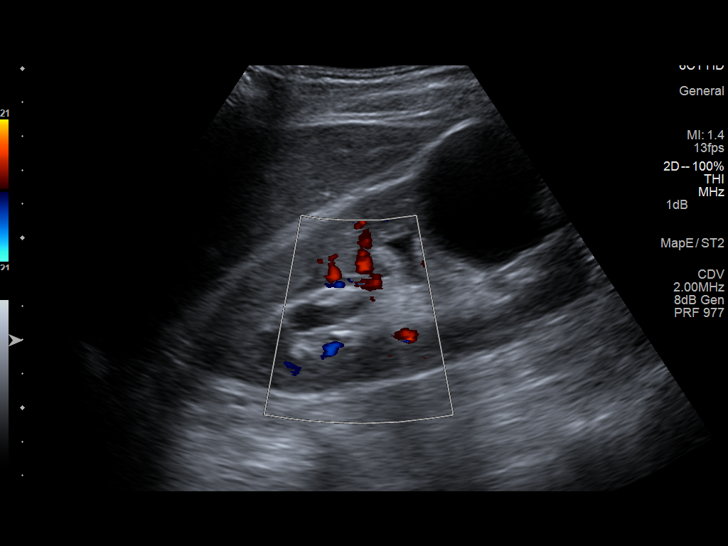
[im 14/53]
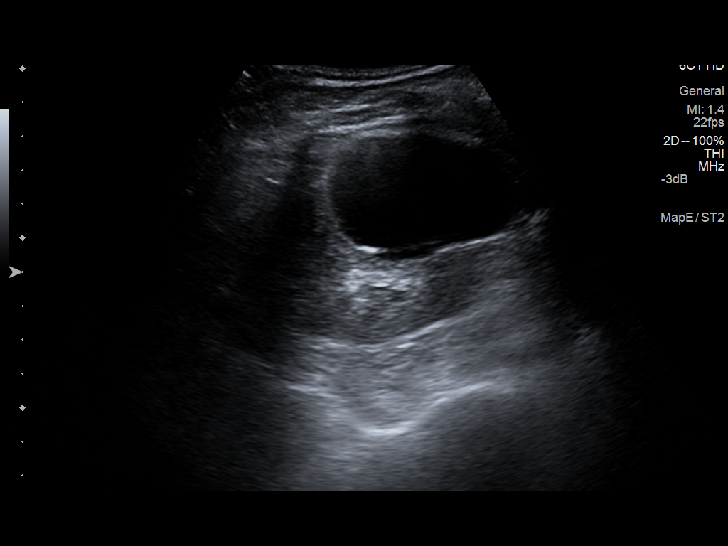
[im 18/53]
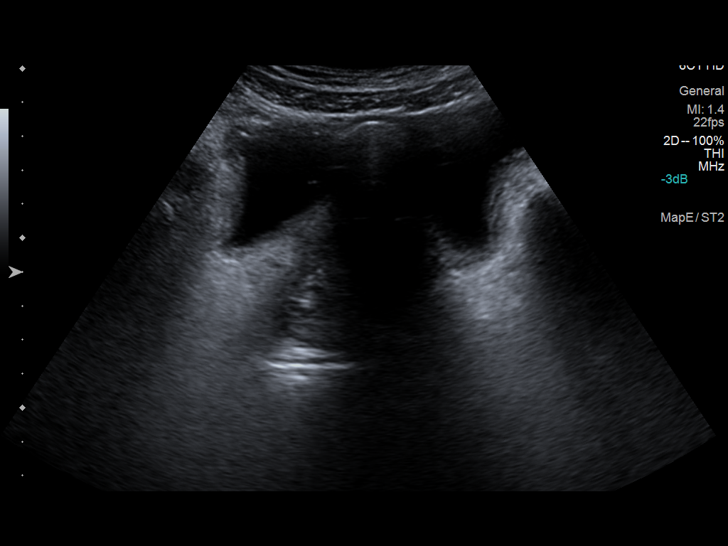
[im 22/53]
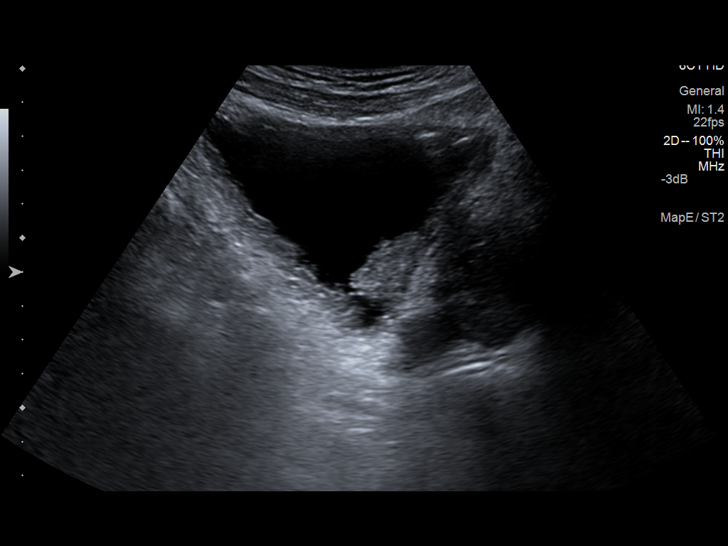
[im 27/53]
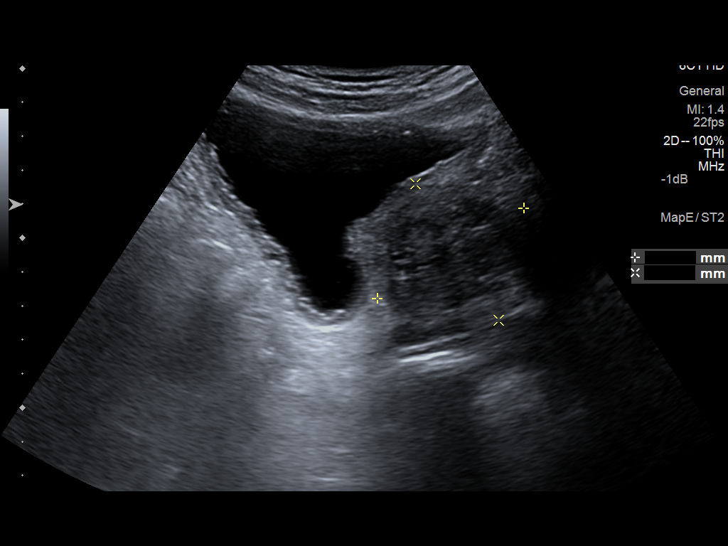
[im 31/53]
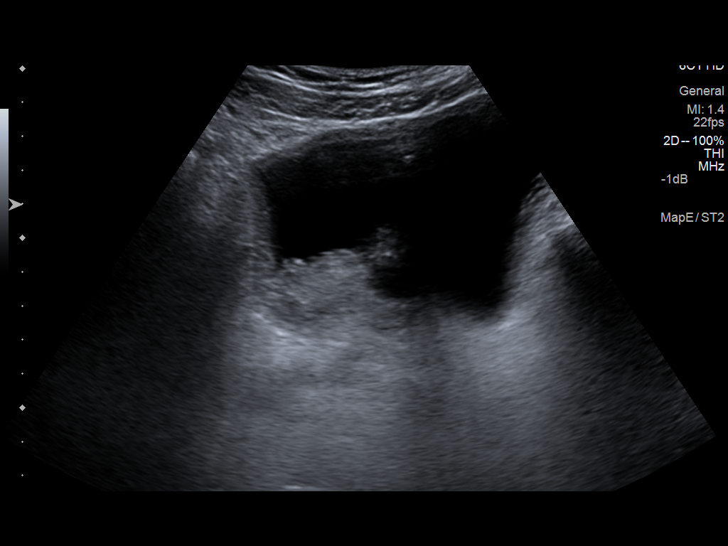
[im 35/53]
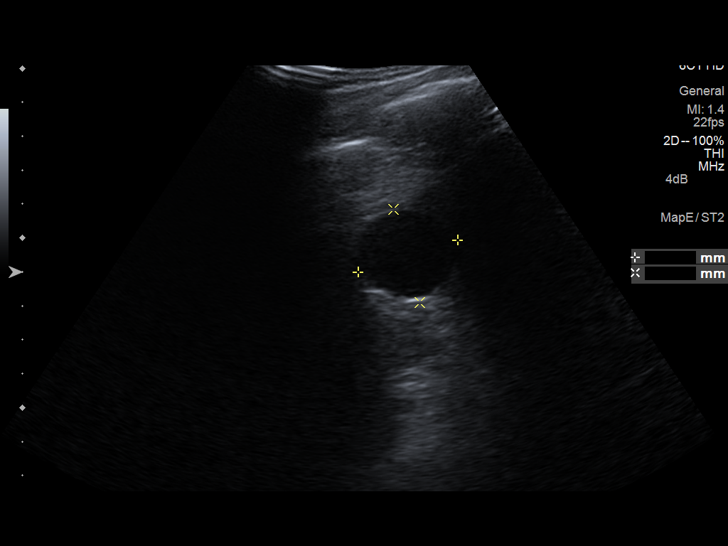
[im 40/53]
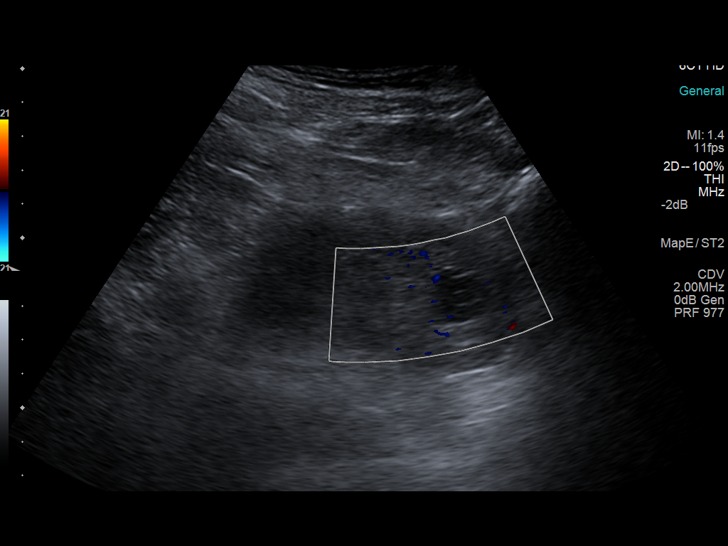
[im 44/53]
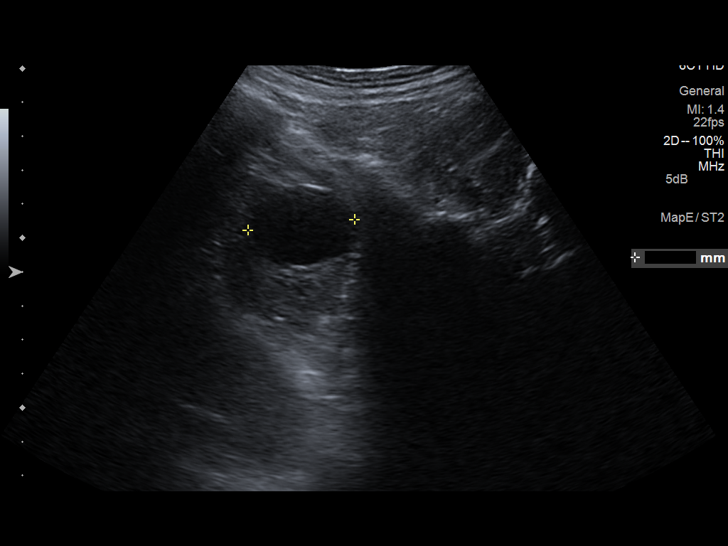
[im 48/53]
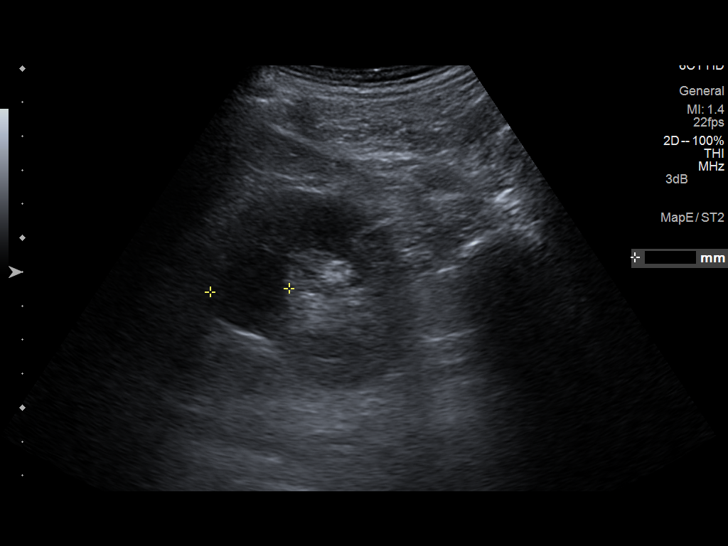
[im 53/53]
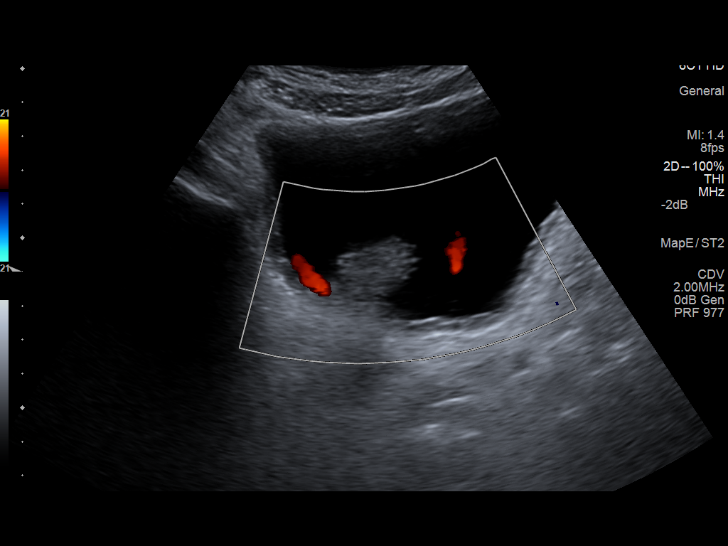

[13 of 25 positions shown; findings below may reference images not displayed]

FINDINGS: Right Kidney

Length: 12.1 cm.. Normal echogenicity. There is mild hydronephrosis.
Multiple cysts are noted within the right kidney. The largest is in
the lower pole measuring 5.3 x 4.2 x 5.7 cm. Septation is noted
stress set this dominant cyst is complicated by an internal area
thin septation.

Left Kidney

Length: 13 cm.. Normal echogenicity. Multiple cysts are noted. The
largest is in the upper pole measuring 3.1 cm. Within the inferior
pole there is a cyst measuring 1.6 x 1.4 x 1.9 cm. This cyst is
hypoechoic and contains diffuse internal echoes.

Bladder

Thick walled urinary bladder is identified. Soft tissue filling
defect is identified within the bladder base. There is blood flow
identified within this defect which is suspicious for a mass. This
measures 2.7 x 1.7 x 3.1 cm.

Other: The prostate gland is enlarged. The gland measures 5.1 x
x 5.6 cm.
IMPRESSION: 1. Prostate gland enlargement. There is bladder wall thickening
which may reflect chronic bladder outlet obstruction.
2. Suspicious lesion is identified within the bladder base. Cannot
rule out urothelial tumor. Consider further evaluation with either
contrast-enhanced hematuria protocol CT or direct visualization.
3. Bilateral renal cysts. At least two these cysts do not meet the
criteria for simple cyst. Further assessment with contrast enhanced
cross-sectional imaging is advised. The study of choice would be an
MRI.
4. Mild right hydronephrosis.

## 2015-05-16 DIAGNOSIS — Z0000 Encounter for general adult medical examination without abnormal findings: Secondary | ICD-10-CM | POA: Diagnosis not present

## 2015-05-16 DIAGNOSIS — Z86008 Personal history of in-situ neoplasm of other site: Secondary | ICD-10-CM | POA: Diagnosis not present

## 2015-05-16 DIAGNOSIS — N138 Other obstructive and reflux uropathy: Secondary | ICD-10-CM | POA: Diagnosis not present

## 2015-05-16 DIAGNOSIS — R3912 Poor urinary stream: Secondary | ICD-10-CM | POA: Diagnosis not present

## 2015-05-16 DIAGNOSIS — D090 Carcinoma in situ of bladder: Secondary | ICD-10-CM | POA: Diagnosis not present

## 2015-05-16 DIAGNOSIS — N401 Enlarged prostate with lower urinary tract symptoms: Secondary | ICD-10-CM | POA: Diagnosis not present

## 2015-05-20 ENCOUNTER — Emergency Department (HOSPITAL_COMMUNITY)
Admission: EM | Admit: 2015-05-20 | Discharge: 2015-05-20 | Disposition: A | Discharge status: Home / Self Care | Payer: Medicare Other | Attending: Emergency Medicine | Admitting: Emergency Medicine

## 2015-05-20 ENCOUNTER — Emergency Department (HOSPITAL_COMMUNITY): Payer: Medicare Other

## 2015-05-20 ENCOUNTER — Encounter (HOSPITAL_COMMUNITY): Payer: Self-pay

## 2015-05-20 DIAGNOSIS — Z8719 Personal history of other diseases of the digestive system: Secondary | ICD-10-CM | POA: Diagnosis not present

## 2015-05-20 DIAGNOSIS — Z87448 Personal history of other diseases of urinary system: Secondary | ICD-10-CM | POA: Insufficient documentation

## 2015-05-20 DIAGNOSIS — M109 Gout, unspecified: Secondary | ICD-10-CM | POA: Diagnosis not present

## 2015-05-20 DIAGNOSIS — Z8551 Personal history of malignant neoplasm of bladder: Secondary | ICD-10-CM | POA: Insufficient documentation

## 2015-05-20 DIAGNOSIS — Z79899 Other long term (current) drug therapy: Secondary | ICD-10-CM | POA: Insufficient documentation

## 2015-05-20 DIAGNOSIS — M7989 Other specified soft tissue disorders: Secondary | ICD-10-CM | POA: Diagnosis not present

## 2015-05-20 DIAGNOSIS — Z8669 Personal history of other diseases of the nervous system and sense organs: Secondary | ICD-10-CM | POA: Diagnosis not present

## 2015-05-20 DIAGNOSIS — Z7982 Long term (current) use of aspirin: Secondary | ICD-10-CM | POA: Insufficient documentation

## 2015-05-20 DIAGNOSIS — Z973 Presence of spectacles and contact lenses: Secondary | ICD-10-CM | POA: Diagnosis not present

## 2015-05-20 DIAGNOSIS — Q6102 Congenital multiple renal cysts: Secondary | ICD-10-CM | POA: Insufficient documentation

## 2015-05-20 DIAGNOSIS — M179 Osteoarthritis of knee, unspecified: Secondary | ICD-10-CM | POA: Diagnosis not present

## 2015-05-20 DIAGNOSIS — Z87891 Personal history of nicotine dependence: Secondary | ICD-10-CM | POA: Insufficient documentation

## 2015-05-20 DIAGNOSIS — I10 Essential (primary) hypertension: Secondary | ICD-10-CM | POA: Insufficient documentation

## 2015-05-20 DIAGNOSIS — M10061 Idiopathic gout, right knee: Secondary | ICD-10-CM | POA: Insufficient documentation

## 2015-05-20 DIAGNOSIS — Z8639 Personal history of other endocrine, nutritional and metabolic disease: Secondary | ICD-10-CM | POA: Insufficient documentation

## 2015-05-20 DIAGNOSIS — M25561 Pain in right knee: Secondary | ICD-10-CM | POA: Diagnosis present

## 2015-05-20 DIAGNOSIS — I4891 Unspecified atrial fibrillation: Secondary | ICD-10-CM | POA: Diagnosis not present

## 2015-05-20 LAB — SYNOVIAL CELL COUNT + DIFF, W/ CRYSTALS
EOSINOPHILS-SYNOVIAL: 0 % (ref 0–1)
LYMPHOCYTES-SYNOVIAL FLD: 2 % (ref 0–20)
MONOCYTE-MACROPHAGE-SYNOVIAL FLUID: 13 % — AB (ref 50–90)
Neutrophil, Synovial: 85 % — ABNORMAL HIGH (ref 0–25)
WBC, SYNOVIAL: 30000 /mm3 — AB (ref 0–200)

## 2015-05-20 LAB — GRAM STAIN: Special Requests: NORMAL

## 2015-05-20 MED ORDER — IBUPROFEN 600 MG PO TABS: 600.0000 mg | ORAL_TABLET | Freq: Three times a day (TID) | ORAL | Status: DC | PRN

## 2015-05-20 MED ORDER — LIDOCAINE-EPINEPHRINE (PF) 2 %-1:200000 IJ SOLN
20.0000 mL | Freq: Once | INTRAMUSCULAR | Status: AC
  Administered 2015-05-20: 20 mL
  Filled 2015-05-20: qty 20

## 2015-05-20 MED ORDER — OXYCODONE-ACETAMINOPHEN 5-325 MG PO TABS: 2.0000 | ORAL_TABLET | Freq: Once | ORAL | Status: DC

## 2015-05-20 MED ORDER — IBUPROFEN 400 MG PO TABS
600.0000 mg | ORAL_TABLET | Freq: Once | ORAL | Status: AC
  Administered 2015-05-20: 600 mg via ORAL
  Filled 2015-05-20: qty 1

## 2015-05-20 NOTE — Progress Notes (Signed)
Orthopedic Tech Progress Note Patient Details:  Tommy Morris February 02, 1940 [id number] Pt. refused crutches.  Pt. stated "I don't need those.  I have a cane." Ortho Devices Type of Ortho Device: Crutches   Darrol Poke 05/20/2015, 9:16 PM

## 2015-05-20 NOTE — ED Notes (Signed)
MD at bedside. 

## 2015-05-20 NOTE — Discharge Instructions (Signed)

## 2015-05-20 NOTE — ED Provider Notes (Signed)
CSN: [id number]     Arrival date & time 05/20/15  1548 History   First MD Initiated Contact with Patient 05/20/15 1718     Chief Complaint  Patient presents with  . Knee Pain     HPI Patient presents to the emergency department with complaints of increasing right knee pain today.  No fevers or chills.  He does have a history of gout in the foot but has never had gout in his knee.  He does admit to alcohol use.  No recent injury or trauma.  No fevers or chills.  Denies weakness or numbness of his right leg.  Presents with a swollen and painful right knee.  Pain is moderate to severe in severity and worse with range of motion of the right knee.   Past Medical History  Diagnosis Date  . Hypertension   . Diverticulosis of colon   . Pulmonary nodule     Two small right lung nodules on chest CT 04/25/2009  . Elevated PSA   . Bladder tumor   . History of bladder cancer     2006  S/P TURBT  UROTHELIAL CARCINOMA  . History of urinary retention     LAST ISSUE 11/2012 W/ ARF  . Hyperlipidemia   . Tinnitus of right ear     CHRONIC  . Bilateral renal cysts     SIMPLE  . History of gout   . BPH (benign prostatic hypertrophy) with urinary obstruction   . Atrial fibrillation ([place]) chronic     echo 05/03/2009 mildly to moderately reduced LV systolic function ef [id number]  Patient declines anticoagulation for stroke prophylaxis.  . Mild mitral valve prolapse   . Mitral valve regurgitation   . OA (osteoarthritis) of knee   . Wears glasses    Past Surgical History  Procedure Laterality Date  . Transurethral resection of bladder tumor  10-26-2004    PLACEMENT RIGHT URETERAL STENT AND TRANSRECTAL PROSTATE BX  . Transthoracic echocardiogram  05-03-2009    DIFFUSE HYPOKINESIS/ EF 40-45%/  MILD MVP WITH MILD TO MODERATE MR/  MILDLY DILATED LA & RA  . Tonsillectomy  AGE 31  . Cystoscopy with retrograde pyelogram, ureteroscopy and stent placement Bilateral 01/06/2013    Procedure: CYSTOSCOPY WITH  BILATERAL RETROGRADE PYELOGRAM, ;  Surgeon: Irine Seal, MD;  Location: [facility];  Service: Urology;  Laterality: Bilateral;  . Transurethral resection of bladder tumor with gyrus (turbt-gyrus) N/A 01/06/2013    Procedure: TRANSURETHRAL RESECTION OF BLADDER TUMOR WITH GYRUS (TURBT-GYRUS);  Surgeon: Irine Seal, MD;  Location: [facility];  Service: Urology;  Laterality: N/A;  . Transurethral resection of prostate N/A 01/06/2013    Procedure: TRANSURETHRAL RESECTION OF THE PROSTATE (TURP);  Surgeon: Irine Seal, MD;  Location: [facility];  Service: Urology;  Laterality: N/A;   Family History  Problem Relation Age of Onset  . Alzheimer's disease Mother     Died in 56 at age 1.  . Cancer Neg Hx   . Diabetes Neg Hx   . Hypertension Neg Hx   . Heart disease Neg Hx    Social History  Substance Use Topics  . Smoking status: Former Smoker -- 1.00 packs/day for 50 years    Types: Cigarettes    Quit date: 01/22/2002  . Smokeless tobacco: Former Systems developer    Types: [place] date: 01/23/1964  . Alcohol Use: 10.5 oz/week    21 drink(s) per week     Comment:  Average of about 3 drinks per day.    Review of Systems  All other systems reviewed and are negative.     Allergies  Review of patient's allergies indicates no known allergies.  Home Medications   Prior to Admission medications   Medication Sig Start Date End Date Taking? Authorizing Provider  aspirin 81 MG EC tablet Take 1 tablet (81 mg total) by mouth daily. 03/28/15  Yes Axel Filler, MD  furosemide (LASIX) 40 MG tablet Take 40 mg by mouth daily as needed for fluid.  03/08/15  Yes Historical Provider, MD  ibuprofen (ADVIL,MOTRIN) 600 MG tablet Take 1 tablet (600 mg total) by mouth every 8 (eight) hours as needed. 05/20/15   Jola Schmidt, MD  metoprolol tartrate (LOPRESSOR) 25 MG tablet Take 1 tablet (25 mg total) by mouth 2 (two) times daily. 03/28/15 03/27/16 Yes Axel Filler, MD   BP 137/95 mmHg  Pulse 82  Temp(Src) 97.7 F (36.5 C) (Oral)  Resp 16  SpO2 99% Physical Exam  Constitutional: He is oriented to person, place, and time. He appears well-developed and well-nourished.  HENT:  Head: Normocephalic.  Eyes: EOM are normal.  Neck: Normal range of motion.  Pulmonary/Chest: Effort normal.  Abdominal: He exhibits no distension.  Musculoskeletal: Normal range of motion.  Swollen warm right knee.  Normal pulses right foot.  Mild pain with range of motion of right knee.  Able to range slightly  Neurological: He is alert and oriented to person, place, and time.  Psychiatric: He has a normal mood and affect.  Nursing note and vitals reviewed.   ED Course  .Joint Aspiration/Arthrocentesis Performed by: Jola Schmidt Authorized by: Jola Schmidt Consent: Verbal consent obtained. Risks and benefits: risks, benefits and alternatives were discussed Consent given by: patient Required items: required blood products, implants, devices, and special equipment available Patient identity confirmed: verbally with patient Time out: Immediately prior to procedure a "time out" was called to verify the correct patient, procedure, equipment, support staff and site/side marked as required. Indications: joint swelling,  pain and possible septic joint  Body area: knee Joint: right knee Local anesthesia used: yes Local anesthetic: lidocaine 2% with epinephrine Preparation: Patient was prepped and draped in the usual sterile fashion. Needle gauge: 20 G Ultrasound guidance: no Approach: lateral Aspirate: yellow and blood-tinged Aspirate amount: 30 mL Patient tolerance: Patient tolerated the procedure well with no immediate complications   (including critical care time) Labs Review Labs Reviewed  SYNOVIAL CELL COUNT + DIFF, W/ CRYSTALS - Abnormal; Notable for the following:    Color, Synovial RED (*)    Appearance-Synovial TURBID (*)    WBC, Synovial 30000  (*)    Neutrophil, Synovial 85 (*)    Monocyte-Macrophage-Synovial Fluid 13 (*)    All other components within normal limits  GRAM STAIN  CULTURE, BODY FLUID-BOTTLE  GLUCOSE, SYNOVIAL FLUID  PROTEIN, SYNOVIAL FLUID    Imaging Review Dg Knee Complete 4 Views Right  05/20/2015  CLINICAL DATA:  Pain and swelling, no known injury EXAM: RIGHT KNEE - COMPLETE 4+ VIEW COMPARISON:  04/02/2009 FINDINGS: Four views of the right knee submitted. No acute fracture or subluxation. Mild narrowing of medial joint compartment. Mild chondrocalcinosis. Moderate joint effusion. Atherosclerotic calcifications of femoral and popliteal artery. IMPRESSION: No acute fracture or subluxation. Mild chondrocalcinosis. Degenerative changes. Moderate joint effusion. Electronically Signed   By: Lahoma Crocker M.D.   On: 05/20/2015 17:00   I have personally reviewed and evaluated these images and lab  results as part of my medical decision-making.   EKG Interpretation None      MDM   Final diagnoses:  Acute gout of right knee, unspecified cause   Arthrocentesis consistent with gout.  Patient be discharged home in good condition with instructions for low purine diet and anti-inflammatories.  Patient understands return the emergency department for new or worsening symptoms.  Primary care follow-up    Jola Schmidt, MD 05/20/15 2026

## 2015-05-20 NOTE — ED Notes (Signed)
Pt reports pain and swelling to right knee that he noticed this morning when he woke up. Pt denies recent injury and reports pain when ambulating. Pt denies pain anywhere else on body.

## 2015-05-20 NOTE — ED Notes (Signed)
Pts discharged vitals taken.  Pt getting dressed at this time.

## 2015-05-21 LAB — PROTEIN, SYNOVIAL FLUID: Protein, Synovial Fluid: 3.4 g/dL — ABNORMAL HIGH (ref 1.0–3.0)

## 2015-05-21 LAB — GLUCOSE, SYNOVIAL FLUID: GLUCOSE, SYNOVIAL FLUID: 58 mg/dL

## 2015-05-25 LAB — CULTURE, BODY FLUID-BOTTLE: CULTURE: NO GROWTH

## 2015-05-25 LAB — CULTURE, BODY FLUID W GRAM STAIN -BOTTLE

## 2015-06-08 ENCOUNTER — Other Ambulatory Visit: Payer: Self-pay | Admitting: Student in an Organized Health Care Education/Training Program

## 2015-06-09 ENCOUNTER — Telehealth: Payer: Self-pay | Admitting: Student in an Organized Health Care Education/Training Program

## 2015-06-09 NOTE — Telephone Encounter (Signed)
error 

## 2015-07-05 ENCOUNTER — Other Ambulatory Visit: Payer: Self-pay | Admitting: Student in an Organized Health Care Education/Training Program

## 2015-07-25 ENCOUNTER — Encounter: Payer: Self-pay | Admitting: Student in an Organized Health Care Education/Training Program

## 2015-07-25 ENCOUNTER — Ambulatory Visit (INDEPENDENT_AMBULATORY_CARE_PROVIDER_SITE_OTHER): Payer: Medicare Other | Admitting: Student in an Organized Health Care Education/Training Program

## 2015-07-25 VITALS — BP 130/87 | HR 80 | Temp 97.8°F | Ht 73.0 in | Wt 149.9 lb

## 2015-07-25 DIAGNOSIS — Z7982 Long term (current) use of aspirin: Secondary | ICD-10-CM | POA: Diagnosis not present

## 2015-07-25 DIAGNOSIS — H5440 Blindness, one eye, unspecified eye: Secondary | ICD-10-CM | POA: Insufficient documentation

## 2015-07-25 DIAGNOSIS — M1A0610 Idiopathic chronic gout, right knee, without tophus (tophi): Secondary | ICD-10-CM | POA: Diagnosis not present

## 2015-07-25 DIAGNOSIS — IMO0002 Reserved for concepts with insufficient information to code with codable children: Secondary | ICD-10-CM

## 2015-07-25 DIAGNOSIS — F1020 Alcohol dependence, uncomplicated: Secondary | ICD-10-CM

## 2015-07-25 DIAGNOSIS — I482 Chronic atrial fibrillation, unspecified: Secondary | ICD-10-CM

## 2015-07-25 DIAGNOSIS — L309 Dermatitis, unspecified: Secondary | ICD-10-CM

## 2015-07-25 DIAGNOSIS — H5442 Blindness, left eye, normal vision right eye: Secondary | ICD-10-CM | POA: Diagnosis not present

## 2015-07-25 DIAGNOSIS — F1090 Alcohol use, unspecified, uncomplicated: Secondary | ICD-10-CM

## 2015-07-25 DIAGNOSIS — M109 Gout, unspecified: Secondary | ICD-10-CM | POA: Insufficient documentation

## 2015-07-25 MED ORDER — TRIAMCINOLONE ACETONIDE 0.1 % EX CREA: 1.0000 "application " | TOPICAL_CREAM | Freq: Two times a day (BID) | CUTANEOUS | Status: DC

## 2015-07-25 NOTE — Assessment & Plan Note (Signed)
Chronic atrial fibrillation is rate controlled with metoprolol low dose. He is at least moderate risk for atrial fibrillation related CVA. Currently only uses aspirin 81 mg daily for primary prophylaxis. He has declined from multiple providers to use anticoagulation. Her last visit he was amenable to a DOAC, however the cost was prohibitive. He still does not want to use Coumadin because of its frequent monitoring requirements. Plan is to continue only aspirin and metoprolol, the patient understands the risk he is taking.

## 2015-07-25 NOTE — Assessment & Plan Note (Signed)
Intermittent itching rash on the flexor surfaces of his arms and arm pits of the last few months. On exam he has a hyperpigmented plaque underneath his left arm pit consistent with likely eczema given his distribution and flexor surfaces and no recent exposure to irritants to suggest contact dermatitis. Plan will be for therapeutic trial of triamcinolone 0.1% cream BID and follow-up with [place] as needed.

## 2015-07-25 NOTE — Patient Instructions (Signed)
1. Keep taking your medications as written.   2. Your blood pressure looks great.   3. You still have A Fib, which is a heart condition. This puts you at risk for a stroke in the future. Keep taking your aspirin which will help lower that risk. I would like you to think about changing to Coumadin, which is the best blood thinner that will lower your risk of stroke more than aspirin. Let me know if you want to start that in the future.

## 2015-07-25 NOTE — Assessment & Plan Note (Signed)
Continues to drink 3-4 glasses of gin every night. Says he's been drinking since he was 75 years old. No history of withdrawal symptoms in the past or admissions for delirium tremens. Not interested in reducing alcohol intake, though I did link that his alcohol will put him at risk for future gout flares. He's had no stigmata of cirrhosis on his exam, CT of the abdomen in 2016 showed normal spleen size making portal hypertension unlikely.

## 2015-07-25 NOTE — Assessment & Plan Note (Signed)
He had an episode a few months ago of sudden onset left knee pain, swelling, redness. He went to the emergency departments and had an arthrocentesis which demonstrated urate crystals consistent with a new diagnosis of gout. He reports cutting back on red meat since that time. The pain resolved after few days with NSAIDs. We talked about strategies of treating flares versus prophylaxing against future flares. Patient has an aversion to taking multiple medications and would prefer to just treat flares as they come for now. Will use high dose NSAIDs in the future for further gout flares.

## 2015-07-25 NOTE — Progress Notes (Signed)
   See Encounters tab for problem-based medical decision making  __________________________________________________________  HPI:  75 year old man here for evaluation of an itchy rash on his arms. Says that it has come and gone over the last few months. Reports that there is a rash underneath his armpit he has noticed for several weeks. Denies any new soaps, detergents, sheets, or other exposures. Denies any rhinorrhea, shortness of breath, or cough. Denies any seasonal allergies. Says that it does not hurt, just itches. No recent fevers or chills. Eating and drinking well. Lives by himself in a apartment in [place]. He has 2 daughters in [place] who he visits regularly. He is independent in all his activities of daily living, still drives, shops, manages his own medications. He drinks 3-4 glasses of gin per day, he enjoys drinking and has no plans to cut back. Continues to do well with tobacco cessation. He reports that his weight is stable. He follows up with urology every 3 months for his prior bladder cancer.  __________________________________________________________  Problem List: Patient Active Problem List   Diagnosis Date Noted  . Gout 07/25/2015  . Blindness of left eye 07/25/2015  . Eczema 07/25/2015  . Counseling regarding advanced directives and goals of care 03/28/2015  . Chronic systolic heart failure ([place]) 03/28/2015  . Alcohol use disorder ([place]) 05/04/2009  . Atrial fibrillation ([place]) 04/27/2009  . Malignant neoplasm of bladder ([place]) 02/01/2006  . Essential hypertension 02/01/2006    Medications: Reconciled today in Epic __________________________________________________________  Physical Exam:  Vital Signs: Filed Vitals:   07/25/15 0857  BP: 130/87  Pulse: 80  Temp: 97.8 F (36.6 C)  TempSrc: Oral  Height: 6\' 1"  (1.854 m)  Weight: 149 lb 14.4 oz (67.994 kg)  SpO2: 100%    Gen: Well appearing, NAD Neck: No cervical LAD, No thyromegaly or nodules,  No JVD. CV: RRR, no murmurs Pulm: Normal effort, CTA throughout, no wheezing Abd: Soft, NT, ND, normal BS.  Ext: Warm, trace non-pitting edema, normal joints Skin: Underneath his left armpit she has a hyperpigmented well demarcated plaque at about 10 x 15 cm with papular changes consistent with dermatitis.

## 2015-07-25 NOTE — Assessment & Plan Note (Signed)
Occurred at age 75 from a trauma to the eye. He follows sporadically with an optometrist. I advised annual eye visits to better care for his vision in his right eye, especially because he continues to drive. Patient's can schedule follow-up with his eye doctor soon.

## 2015-08-17 DIAGNOSIS — N401 Enlarged prostate with lower urinary tract symptoms: Secondary | ICD-10-CM | POA: Diagnosis not present

## 2015-08-17 DIAGNOSIS — Z8551 Personal history of malignant neoplasm of bladder: Secondary | ICD-10-CM | POA: Diagnosis not present

## 2015-08-17 DIAGNOSIS — R3912 Poor urinary stream: Secondary | ICD-10-CM | POA: Diagnosis not present

## 2016-02-10 ENCOUNTER — Other Ambulatory Visit: Payer: Self-pay | Admitting: Student in an Organized Health Care Education/Training Program

## 2016-02-10 ENCOUNTER — Other Ambulatory Visit: Payer: Self-pay | Admitting: Oncology

## 2016-03-14 ENCOUNTER — Other Ambulatory Visit: Payer: Self-pay | Admitting: Student in an Organized Health Care Education/Training Program

## 2016-03-19 ENCOUNTER — Ambulatory Visit (INDEPENDENT_AMBULATORY_CARE_PROVIDER_SITE_OTHER): Payer: Medicare Other | Admitting: Student in an Organized Health Care Education/Training Program

## 2016-03-19 ENCOUNTER — Encounter: Payer: Self-pay | Admitting: Student in an Organized Health Care Education/Training Program

## 2016-03-19 ENCOUNTER — Encounter (INDEPENDENT_AMBULATORY_CARE_PROVIDER_SITE_OTHER): Payer: Self-pay

## 2016-03-19 VITALS — BP 152/76 | HR 85 | Temp 98.5°F | Ht 73.0 in | Wt 141.7 lb

## 2016-03-19 DIAGNOSIS — IMO0002 Reserved for concepts with insufficient information to code with codable children: Secondary | ICD-10-CM

## 2016-03-19 DIAGNOSIS — Z8551 Personal history of malignant neoplasm of bladder: Secondary | ICD-10-CM

## 2016-03-19 DIAGNOSIS — F1020 Alcohol dependence, uncomplicated: Secondary | ICD-10-CM

## 2016-03-19 DIAGNOSIS — Z87891 Personal history of nicotine dependence: Secondary | ICD-10-CM | POA: Diagnosis not present

## 2016-03-19 DIAGNOSIS — Z7982 Long term (current) use of aspirin: Secondary | ICD-10-CM | POA: Diagnosis not present

## 2016-03-19 DIAGNOSIS — I4891 Unspecified atrial fibrillation: Secondary | ICD-10-CM | POA: Diagnosis not present

## 2016-03-19 DIAGNOSIS — D696 Thrombocytopenia, unspecified: Secondary | ICD-10-CM | POA: Insufficient documentation

## 2016-03-19 DIAGNOSIS — R17 Unspecified jaundice: Secondary | ICD-10-CM | POA: Insufficient documentation

## 2016-03-19 DIAGNOSIS — Z79899 Other long term (current) drug therapy: Secondary | ICD-10-CM

## 2016-03-19 DIAGNOSIS — L2082 Flexural eczema: Secondary | ICD-10-CM

## 2016-03-19 DIAGNOSIS — C679 Malignant neoplasm of bladder, unspecified: Secondary | ICD-10-CM

## 2016-03-19 DIAGNOSIS — I10 Essential (primary) hypertension: Secondary | ICD-10-CM | POA: Diagnosis not present

## 2016-03-19 DIAGNOSIS — L819 Disorder of pigmentation, unspecified: Secondary | ICD-10-CM | POA: Diagnosis not present

## 2016-03-19 DIAGNOSIS — I482 Chronic atrial fibrillation, unspecified: Secondary | ICD-10-CM

## 2016-03-19 MED ORDER — TRIAMCINOLONE ACETONIDE 0.1 % EX CREA: 1.0000 "application " | TOPICAL_CREAM | Freq: Two times a day (BID) | CUTANEOUS | 5 refills | Status: DC

## 2016-03-19 MED ORDER — AMLODIPINE BESYLATE 10 MG PO TABS: 10.0000 mg | ORAL_TABLET | Freq: Every day | ORAL | 3 refills | Status: DC

## 2016-03-19 NOTE — Assessment & Plan Note (Addendum)
History of papillary urothelial carcinoma in 2006. This progressed and he had a transurethral resection of bladder tumors in 2014 by Dr. Jeffie Pollock. Surveillance in 2016 showed a 7 mm papillary bladder tumor that was biopsied. Last visit to [facility] was July 2017 with normal cystoscopy. He is overdo for followup now, should see them every three months. I encouraged him to make an appointment.

## 2016-03-19 NOTE — Assessment & Plan Note (Signed)
Blood pressure elevated today. Plan to increase amlodipine from 5 mg to 10 mg once daily.

## 2016-03-19 NOTE — Patient Instructions (Signed)
1. Keep taking your medications as prescribed.   2. Continue to use the Triamcinolone cream on your Eczema once daily. Call me if your itching is getting worse.   3. Try to reduce your drinking to no more that 1-2 drinks per day.

## 2016-03-19 NOTE — Assessment & Plan Note (Signed)
Rate is good today at 85. He is using aspirin alone for primary prevention of a CVA. He has been offered anticoagulation several times in the past but has declined because he does not want to undergo frequent visits for Coumadin and cannot afford DOAC. Plan to continue low-dose metoprolol and aspirin daily.

## 2016-03-19 NOTE — Assessment & Plan Note (Signed)
Bilirubin has been elevated since 2016. This is in the setting of chronic alcohol use disorder. He has risk for having early cirrhosis, though exam otherwise is fairly reassuring. Plan to check CMP, CBC with platelets, and hepatitis serologies. Counseled him on the importance of reducing his alcohol intake.

## 2016-03-19 NOTE — Progress Notes (Signed)
Assessment and Plan:  See Encounters tab for problem-based medical decision making.   __________________________________________________________  HPI:  76 year old man here for follow-up of eczema. He reports still having mild itching throughout the day especially on his arms and on his belly and side. He lives by himself, has moved to an apartment in downtown [place]. Says that he walks outside every day. Denies any dizziness, lightheadedness, palpitations, or chest pain with exertion. Does not exercise. He's been retired for 15 years. He has 5 children but his wife passed away several years ago. For fun he says that he walks over to the "fun house" to get a "taste". Reports good compliance with his medications otherwise. Denies any recent illnesses. No fevers or chills. Eating and drinking well. Weight is stable. Continues to drink on a daily basis, usually at least 2 drinks per day and more on the weekends.  __________________________________________________________  Problem List: Patient Active Problem List   Diagnosis Date Noted  . Chronic systolic heart failure ([place]) 03/28/2015    Priority: High  . Alcohol use disorder ([place]) 05/04/2009    Priority: High  . Atrial fibrillation ([place]) 04/27/2009    Priority: High  . Malignant neoplasm of bladder ([place]) 02/01/2006    Priority: High  . Eczema 07/25/2015    Priority: Medium  . Essential hypertension 02/01/2006    Priority: Medium  . Thrombocytopenia ([place]) 03/19/2016    Priority: Low  . Gout 07/25/2015    Priority: Low  . Blindness of left eye 07/25/2015    Priority: Low  . Counseling regarding advanced directives and goals of care 03/28/2015    Priority: Low  . Elevated bilirubin 03/19/2016    Medications: Reconciled today in Epic __________________________________________________________  Physical Exam:  Vital Signs: Vitals:   03/19/16 0920  BP: (!) 152/76  Pulse: 85  Temp: 98.5 F (36.9 C)  TempSrc: Oral  Weight:  141 lb 11.2 oz (64.3 kg)    Gen: Chronically ill appearing, thin and frail Neck: No cervical LAD, No thyromegaly or nodules, No JVD. CV: RRR, no murmurs Pulm: Normal effort, CTA throughout, no wheezing Ext: Warm, no edema, normal joints Skin: No atypical appearing moles. Chronic patch of hyperpigmentation under both arms, and flexeril elbows, with no papules.

## 2016-03-19 NOTE — Assessment & Plan Note (Signed)
Continues to drink at least 1-2 drinks per day. Says that he does more day drinking on the weekends. He does have some laboratory evidence of cirrhosis including elevated bilirubin and low platelets. I am going to repeat these labs today. He is and is reassuring without other overt signs of portal hypertension.

## 2016-03-19 NOTE — Assessment & Plan Note (Signed)
He has diffuse eczema on his Flexeril surfaces especially under his arms and in his groin. Doing well with topical steroid. Plan to continue triamcinolone 0.1% applied at least daily.

## 2016-03-20 ENCOUNTER — Encounter: Payer: Self-pay | Admitting: Student in an Organized Health Care Education/Training Program

## 2016-03-20 LAB — CMP14 + ANION GAP
ALK PHOS: 279 IU/L — AB (ref 39–117)
ALT: 24 IU/L (ref 0–44)
ANION GAP: 18 mmol/L (ref 10.0–18.0)
AST: 37 IU/L (ref 0–40)
Albumin/Globulin Ratio: 1.3 (ref 1.2–2.2)
Albumin: 4.3 g/dL (ref 3.5–4.8)
BUN/Creatinine Ratio: 15 (ref 10–24)
BUN: 14 mg/dL (ref 8–27)
Bilirubin Total: 1.2 mg/dL (ref 0.0–1.2)
CALCIUM: 9.5 mg/dL (ref 8.6–10.2)
CO2: 25 mmol/L (ref 18–29)
CREATININE: 0.94 mg/dL (ref 0.76–1.27)
Chloride: 96 mmol/L (ref 96–106)
GFR calc Af Amer: 90 mL/min/{1.73_m2} (ref >59)
GFR, EST NON AFRICAN AMERICAN: 78 mL/min/{1.73_m2} (ref >59)
GLOBULIN, TOTAL: 3.3 g/dL (ref 1.5–4.5)
Glucose: 93 mg/dL (ref 65–99)
Potassium: 3.6 mmol/L (ref 3.5–5.2)
Sodium: 139 mmol/L (ref 134–144)
Total Protein: 7.6 g/dL (ref 6.0–8.5)

## 2016-03-20 LAB — HEPATITIS C ANTIBODY

## 2016-03-20 LAB — HEPATITIS B SURFACE ANTIGEN: HEP B S AG: NEGATIVE

## 2016-03-20 LAB — HEPATITIS B SURFACE ANTIBODY,QUALITATIVE: Hep B Surface Ab, Qual: NONREACTIVE

## 2016-03-20 LAB — CBC
HEMATOCRIT: 40.8 % (ref 37.5–51.0)
HEMOGLOBIN: 13.7 g/dL (ref 13.0–17.7)
MCH: 31.9 pg (ref 26.6–33.0)
MCHC: 33.6 g/dL (ref 31.5–35.7)
MCV: 95 fL (ref 79–97)
Platelets: 234 10*3/uL (ref 150–379)
RBC: 4.3 x10E6/uL (ref 4.14–5.80)
RDW: 14.6 % (ref 12.3–15.4)
WBC: 5 10*3/uL (ref 3.4–10.8)

## 2016-03-20 LAB — HEPATITIS B CORE ANTIBODY, TOTAL: HEP B C TOTAL AB: NEGATIVE

## 2016-03-20 NOTE — Addendum Note (Signed)
Addended by: Truddie Crumble on: 03/20/2016 09:22 AM   Modules accepted: Orders

## 2016-09-03 ENCOUNTER — Ambulatory Visit (INDEPENDENT_AMBULATORY_CARE_PROVIDER_SITE_OTHER): Payer: Medicare Other | Admitting: Student in an Organized Health Care Education/Training Program

## 2016-09-03 VITALS — BP 113/85 | HR 71 | Temp 97.6°F | Ht 73.0 in | Wt 141.1 lb

## 2016-09-03 DIAGNOSIS — Z79899 Other long term (current) drug therapy: Secondary | ICD-10-CM | POA: Diagnosis not present

## 2016-09-03 DIAGNOSIS — I10 Essential (primary) hypertension: Secondary | ICD-10-CM

## 2016-09-03 DIAGNOSIS — F1099 Alcohol use, unspecified with unspecified alcohol-induced disorder: Secondary | ICD-10-CM | POA: Diagnosis not present

## 2016-09-03 DIAGNOSIS — L309 Dermatitis, unspecified: Secondary | ICD-10-CM

## 2016-09-03 DIAGNOSIS — I110 Hypertensive heart disease with heart failure: Secondary | ICD-10-CM | POA: Diagnosis not present

## 2016-09-03 DIAGNOSIS — I5022 Chronic systolic (congestive) heart failure: Secondary | ICD-10-CM | POA: Diagnosis not present

## 2016-09-03 DIAGNOSIS — L2082 Flexural eczema: Secondary | ICD-10-CM

## 2016-09-03 DIAGNOSIS — Z87891 Personal history of nicotine dependence: Secondary | ICD-10-CM

## 2016-09-03 DIAGNOSIS — I482 Chronic atrial fibrillation, unspecified: Secondary | ICD-10-CM

## 2016-09-03 DIAGNOSIS — IMO0002 Reserved for concepts with insufficient information to code with codable children: Secondary | ICD-10-CM

## 2016-09-03 NOTE — Assessment & Plan Note (Signed)
Blood pressure is at goal. Continue with amlodipine 10mg  daily and metoprolol 25mg  bid.

## 2016-09-03 NOTE — Assessment & Plan Note (Signed)
Symptomatically stable. Continue with triamcinolone 0.1% cream as needed to effected areas.

## 2016-09-03 NOTE — Assessment & Plan Note (Signed)
Weight is stable, he is euvolemic, he has minimal symptoms with exertion. Overall he is doing well. Plan is to continue with metoprolol daily and aspirin for primary prevention of ischemic disease. Unfortunately I think his low EF state will worsen in the future given his active alcohol use disorder.

## 2016-09-03 NOTE — Assessment & Plan Note (Signed)
Rate is stable today at 71 bpm. No symptoms of presyncope or palpitations, no CP or pressure. Overall doing well. He has consistently declined anticoagulation in the past, and accepts the risk of cardioembolic CVA. Plan is to continue with metoprolol and aspirin.

## 2016-09-03 NOTE — Assessment & Plan Note (Signed)
Continues to drink 3-4 glasses a day, and has no desire to reduce or quit. He is not interested in Naltrexone. No physical exam signs of cirrhosis today, which he is at risk for in the future.

## 2016-09-03 NOTE — Progress Notes (Signed)
   Assessment and Plan:  See Encounters tab for problem-based medical decision making.   __________________________________________________________  HPI:   76 year old man here for follow-up of hypertension. Patient reports doing very well at home. He currently has no complaints. He's retired, he reports good compliance with his medications and no side effects. He continues to drink alcohol on a daily basis. Drinks about 3-4 drinks per day. Says he doesn't drink every single day. He says that he goes to the "good time house" once a day, this is where he spends time with his friends and does most of his drinking. He is eating well and his weight is stable. He does not exercise. Denies chest pain, pressure, or dyspnea with exertion. No fevers or chills. No recent visits to emergency departments and no recent hospitalizations.   _________________________________________________________  Problem List: Patient Active Problem List   Diagnosis Date Noted  . Chronic systolic heart failure ([place]) 03/28/2015    Priority: High  . Alcohol use disorder ([place]) 05/04/2009    Priority: High  . Atrial fibrillation ([place]) 04/27/2009    Priority: High  . Malignant neoplasm of bladder ([place]) 02/01/2006    Priority: High  . Eczema 07/25/2015    Priority: Medium  . Essential hypertension 02/01/2006    Priority: Medium  . Gout 07/25/2015    Priority: Low  . Blindness of left eye 07/25/2015    Priority: Low  . Counseling regarding advanced directives and goals of care 03/28/2015    Priority: Low    Medications: Reconciled today in Epic __________________________________________________________  Physical Exam:  Vital Signs: Vitals:   09/03/16 0931  BP: 113/85  Pulse: 71  Temp: 97.6 F (36.4 C)  TempSrc: Oral  SpO2: 100%  Weight: 141 lb 1.6 oz (64 kg)  Height: 6\' 1"  (1.854 m)    Gen: Chronically ill-appearing thin man.  ENT: OP clear without erythema or exudate.  Neck: No cervical LAD, No  thyromegaly or nodules, No JVD. CV: RRR, no murmurs  Pulm: Normal effort, CTA throughout, no wheezing Abd: Soft,  nondistended, mild hepatomegaly, no splenomegaly, no fluid wave.  Ext: Warm, no edema, normal joints Skin: No atypical appearing moles. No rashes. Chronic stasis changes bilaterally on his lower extremities

## 2016-12-06 ENCOUNTER — Encounter: Payer: Self-pay | Admitting: Internal Medicine

## 2016-12-06 ENCOUNTER — Ambulatory Visit (INDEPENDENT_AMBULATORY_CARE_PROVIDER_SITE_OTHER): Payer: Medicare Other | Admitting: Internal Medicine

## 2016-12-06 DIAGNOSIS — L2082 Flexural eczema: Secondary | ICD-10-CM

## 2016-12-06 MED ORDER — EUCERIN EX CREA
TOPICAL_CREAM | CUTANEOUS | 0 refills | Status: DC | PRN
Start: 2016-12-06 — End: 2017-02-05

## 2016-12-06 NOTE — Progress Notes (Signed)
   CC: Rash  HPI:  [name] is a 76 y.o. male with a past medical history listed below here today with complaints of rash all over his body.  Patient has a history of eczema with predominant flexor surface involvement, particular underneath his arms and in his groin. He has a prescription for triamcinolone cream to use prn for symptoms. He has complaints of diffuse pruritis and skin flaking today. He reports that he has been using the triamcinolone cream. Says that it does help but that it runs out quickly. He says that he uses rubbing alcohol on his skin to help with the itching.   Past Medical History:  Diagnosis Date  . Atrial fibrillation ([place]) chronic    echo 05/03/2009 mildly to moderately reduced LV systolic function ef 78-67%  Patient declines anticoagulation for stroke prophylaxis.  [name] BPH (benign prostatic hypertrophy) with urinary obstruction   . Diverticulosis of colon   . History of bladder cancer    2006  S/P TURBT  UROTHELIAL CARCINOMA  . History of gout   . History of urinary retention    LAST ISSUE 11/2012 W/ ARF  . Hypertension   . Mild mitral valve prolapse   . Mitral valve regurgitation   . OA (osteoarthritis) of knee   . Pulmonary nodule    Two small right lung nodules on chest CT 04/25/2009   Review of Systems:   No chest pain or shortness of breath  Physical Exam:  Vitals:   12/06/16 0840  BP: (!) 149/71  Pulse: 72  Temp: (!) 97.5 F (36.4 C)  TempSrc: Oral  SpO2: 100%  Weight: 140 lb 8 oz (63.7 kg)  Height: 6\' 1"  (1.854 m)   Physical Exam  Constitutional: He is well-developed, well-nourished, and in no distress. No distress.  HENT:  Head: Normocephalic and atraumatic.  Cardiovascular: Normal rate.  Pulmonary/Chest: Effort normal and breath sounds normal.  Abdominal: Soft. Bowel sounds are normal.  Skin: Skin is warm and dry.  Diffuse eczematous rash worse of flexor surfaces  Psychiatric: Mood and affect normal.     Assessment &  Plan:   See Encounters Tab for problem based charting.  Patient discussed with Dr. Rebeca Alert

## 2016-12-06 NOTE — Patient Instructions (Addendum)
Mr. Kia,   Your itching is for your Eczema. I want you to use the Kenalog 0.1% cream Dr. Evette Doffing has prescribed you over your problematic areas. After you take a bath please apply the Ecurerin cream all over your body. I have provided you some more information down below. Please follow up with Dr. Evette Doffing in February.   Identifying triggers Eliminating factors that aggravate your eczema symptoms can help to control the symptoms. Possible triggers may include: ?Cold or dry environments ?Sweating ?Emotional stress or anxiety ?Rapid temperature changes ?Exposure to certain chemicals or cleaning solutions, including soaps and detergents, perfumes and cosmetics, wool or synthetic fibers, dust, sand, and cigarette smoke  Keeping your skin hydrated Emollients-Emollients are creams and ointments that moisturize the skin and prevent it from drying out. The best emollients for people with eczema are thick creams (such as Eucerin, Cetaphil, and Nutraderm) or ointments (such as petroleum jelly, Aquaphor, and Vaseline), which contain little to no water. Emollients are most effective when applied immediately after bathing. Emollients can be applied twice a day or more often if needed. Lotions contain more water than creams and ointments and are less effective for moisturizing the skin. Bathing-It is not clear if showers or baths are better for keeping the skin hydrated. Lukewarm baths or showers can hydrate and cool the skin, temporarily relieving itching from eczema. An unscented, mild soap or non-soap cleanser (such as Cetaphil) should be used sparingly. Apply an emollient immediately after bathing or showering to prevent your skin from drying out as a result of water evaporation. Emollient bath additives (products you add to the bath water) have not been found to help relieve symptoms. Hot or long baths (more than 10 to 15 minutes) and showers should be avoided since they can dry out the skin.

## 2016-12-07 NOTE — Progress Notes (Signed)
Internal Medicine Clinic Attending  Case discussed with Dr. Boswell  at the time of the visit.  We reviewed the resident's history and exam and pertinent patient test results.  I agree with the assessment, diagnosis, and plan of care documented in the resident's note.  Alexander N Raines, MD   

## 2016-12-07 NOTE — Assessment & Plan Note (Signed)
Discussed proper eczema skin care with him today. Advised to stop using rubbing alcohol. Recommenced using Eucerin cream or similar creams daily, especially following bathing. Triamcinolone cream prn on trouble areas.

## 2017-02-05 ENCOUNTER — Other Ambulatory Visit: Payer: Self-pay

## 2017-02-05 MED ORDER — EUCERIN EX CREA: TOPICAL_CREAM | CUTANEOUS | 3 refills | Status: AC | PRN

## 2017-02-05 NOTE — Telephone Encounter (Signed)
Per patient   Skin Protectants, Misc. (EUCERIN) cream   Is not working. Would like a call back from the nurse.

## 2017-02-14 ENCOUNTER — Ambulatory Visit (INDEPENDENT_AMBULATORY_CARE_PROVIDER_SITE_OTHER): Payer: Medicare Other | Admitting: Internal Medicine

## 2017-02-14 DIAGNOSIS — L308 Other specified dermatitis: Secondary | ICD-10-CM

## 2017-02-14 DIAGNOSIS — M1710 Unilateral primary osteoarthritis, unspecified knee: Secondary | ICD-10-CM

## 2017-02-14 DIAGNOSIS — I4891 Unspecified atrial fibrillation: Secondary | ICD-10-CM | POA: Diagnosis not present

## 2017-02-14 DIAGNOSIS — L309 Dermatitis, unspecified: Secondary | ICD-10-CM | POA: Diagnosis not present

## 2017-02-14 DIAGNOSIS — Z87891 Personal history of nicotine dependence: Secondary | ICD-10-CM

## 2017-02-14 DIAGNOSIS — N400 Enlarged prostate without lower urinary tract symptoms: Secondary | ICD-10-CM | POA: Diagnosis not present

## 2017-02-14 NOTE — Patient Instructions (Addendum)
FOLLOW-UP INSTRUCTIONS When: 03/11/2017 For: health maintenance What to bring: medications   Tommy Morris,   It was a pleasure to meet you today.  Your rash is most likely due to eczema, or dry skin. Please try the following things: - Buy a tube of vaseline over the counter. Apply a thin layer of vaseline to all of the affected areas before you go to bed at night and wear long sleeves to cover those areas at night. You can also use the vaseline during the day on the affected areas. - Please start using all non-scented, non-dyed soaps. This includes your bath soap, your dish soap, and laundry detergent. - Do not take hot showers. Only use lukewarm water. - You can buy and use oatmeal bath treatments. Aveeno is a well-known brand but there are many others. - You can continue to use the Eucerin cream after showers.  If your symptoms do not improve in the next few months, we can send a referral to the skin doctors for a biopsy or other treatments.  Please follow up with our clinic on 2/18 with your primary doctor.    Follow these instructions at home: Skin care  Keep your skin well-moisturized. Doing this seals in moisture and helps to prevent dryness. ? Use unscented lotions that have petroleum in them. ? Avoid lotions that contain alcohol or water. They can dry the skin.  Keep baths or showers short (less than 5 minutes) in warm water. Do not use hot water. ? Use mild, unscented cleansers for bathing. Avoid soap and bubble bath. ? Apply a moisturizer to your skin right after a bath or shower.  Do not apply anything to your skin without checking with your health care provider. General instructions  Dress in clothes made of cotton or cotton blends. Dress lightly because heat increases itchiness.  When washing your clothes, rinse your clothes twice so all of the soap is removed.  Avoid any triggers that can cause a flare-up.  Try to manage your stress.  Keep your fingernails cut  short.  Avoid scratching. Scratching makes the rash and itchiness worse. It may also result in a skin infection (impetigo) due to a break in the skin caused by scratching.  Take or apply over-the-counter and prescription medicines only as told by your health care provider.  Keep all follow-up visits as told by your health care provider. This is important.  Do not be around people who have cold sores or fever blisters. If you get the infection, it may cause your atopic dermatitis to worsen.  Atopic Dermatitis Atopic dermatitis is a skin disorder that causes inflammation of the skin. This is the most common type of eczema. Eczema is a group of skin conditions that cause the skin to be itchy, red, and swollen. This condition is generally worse during the cooler winter months and often improves during the warm summer months. Symptoms can vary from person to person. Atopic dermatitis usually starts showing signs in infancy and can last through adulthood. This condition cannot be passed from one person to another (non-contagious), but it is more common in families. Atopic dermatitis may not always be present. When it is present, it is called a flare-up. What are the causes? The exact cause of this condition is not known. Flare-ups of the condition may be triggered by:  Contact with something that you are sensitive or allergic to.  Stress.  Certain foods.  Extremely hot or cold weather.  Harsh chemicals and soaps.  Dry air.  Chlorine.  What increases the risk? This condition is more likely to develop in people who have a personal history or family history of eczema, allergies, asthma, or hay fever. What are the signs or symptoms? Symptoms of this condition include:  Dry, scaly skin.  Red, itchy rash.  Itchiness, which can be severe. This may occur before the skin rash. This can make sleeping difficult.  Skin thickening and cracking that can occur over time.  How is this  diagnosed? This condition is diagnosed based on your symptoms, a medical history, and a physical exam. How is this treated? There is no cure for this condition, but symptoms can usually be controlled. Treatment focuses on:  Controlling the itchiness and scratching. You may be given medicines, such as antihistamines or steroid creams.  Limiting exposure to things that you are sensitive or allergic to (allergens).  Recognizing situations that cause stress and developing a plan to manage stress.  If your atopic dermatitis does not get better with medicines, or if it is all over your body (widespread), a treatment using a specific type of light (phototherapy) may be used. Follow these instructions at home: Skin care  Keep your skin well-moisturized. Doing this seals in moisture and helps to prevent dryness. ? Use unscented lotions that have petroleum in them. ? Avoid lotions that contain alcohol or water. They can dry the skin.  Keep baths or showers short (less than 5 minutes) in warm water. Do not use hot water. ? Use mild, unscented cleansers for bathing. Avoid soap and bubble bath. ? Apply a moisturizer to your skin right after a bath or shower.  Do not apply anything to your skin without checking with your health care provider. General instructions  Dress in clothes made of cotton or cotton blends. Dress lightly because heat increases itchiness.  When washing your clothes, rinse your clothes twice so all of the soap is removed.  Avoid any triggers that can cause a flare-up.  Try to manage your stress.  Keep your fingernails cut short.  Avoid scratching. Scratching makes the rash and itchiness worse. It may also result in a skin infection (impetigo) due to a break in the skin caused by scratching.  Take or apply over-the-counter and prescription medicines only as told by your health care provider.  Keep all follow-up visits as told by your health care provider. This is  important.  Do not be around people who have cold sores or fever blisters. If you get the infection, it may cause your atopic dermatitis to worsen. Contact a health care provider if:  Your itchiness interferes with sleep.  Your rash gets worse or it is not better within one week of starting treatment.  You have a fever.  You have a rash flare-up after having contact with someone who has cold sores or fever blisters. Get help right away if:  You develop pus or soft yellow scabs in the rash area. Summary  This condition causes a red rash and itchy, dry, scaly skin.  Treatment focuses on controlling the itchiness and scratching, limiting exposure to things that you are sensitive or allergic to (allergens), recognizing situations that cause stress, and developing a plan to manage stress.  Keep your skin well-moisturized.  Keep baths or showers shorter than 5 minutes and use warm water. Do not use hot water. This information is not intended to replace advice given to you by your health care provider. Make sure you discuss any questions you  have with your health care provider. Document Released: 01/06/2000 Document Revised: 02/10/2016 Document Reviewed: 02/10/2016 Elsevier Interactive Patient Education  [employer].

## 2017-02-14 NOTE — Progress Notes (Signed)
   CC: persistent rash  HPI:  [name] is a 77 y.o. male with PMH significant for atrial fibrillation, BPH, and OA who presents with a persistent rash.  He was seen in clinic on 11/15 for itching and eczema with predominant flexor surface involvement, particularly underneath his arms and in his groin. He was prescribed triamcinolone 0.1% cream and advised to use Eucerin cream after showering. He was advised of ways to keep his skin hydrated and to avoid possible triggers.  He returns today with persistent rash on his forearms, shins, and upper back. He has had the rash for approximately 4-5 months. He reports that the rash is itchy but not painful. He has been trying Eucerin cream after showering with minimal relief. He was previously trying triamcinolone before November, however he states that this was not helpful and he has not been using it recently.  He states that he started using a new shower soap in the last couple months. No other new lotions, detergents, or soaps. No other changes in his medications.  He denies history of allergies or asthma.  Past Medical History:  Diagnosis Date  . Atrial fibrillation ([place]) chronic    echo 05/03/2009 mildly to moderately reduced LV systolic function ef 02-40%  Patient declines anticoagulation for stroke prophylaxis.  [name] BPH (benign prostatic hypertrophy) with urinary obstruction   . Diverticulosis of colon   . History of bladder cancer    2006  S/P TURBT  UROTHELIAL CARCINOMA  . History of gout   . History of urinary retention    LAST ISSUE 11/2012 W/ ARF  . Hypertension   . Mild mitral valve prolapse   . Mitral valve regurgitation   . OA (osteoarthritis) of knee   . Pulmonary nodule    Two small right lung nodules on chest CT 04/25/2009   Review of Systems:   GEN: Negative for fevers SKIN: Dry scaly rash on bilateral arms, legs, and upper back.  Physical Exam:  Vitals:   02/14/17 1404  BP: 116/72  Pulse: 81  SpO2: 100%    Weight: 147 lb 14.4 oz (67.1 kg)  Height: 6\' 1"  (1.854 m)   GEN: Sitting in chair comfortably in NAD SKIN: Dry scaly rash with occasional small breaks in skin on bilateral forearms, shins, and upper back. No drainage or evidence of infection.  Assessment & Plan:   See Encounters Tab for problem based charting.  Patient discussed with Dr. Daryll Drown

## 2017-02-14 NOTE — Assessment & Plan Note (Addendum)
Assessment Previously seen on 11/15 for eczema. He was advised to use Eucerin cream after showering and proper eczema skin care was discussed with him. He was encouraged to use triamcinolone, however he has not been using this recently as he did not think it was helpful before.  He returns with persistent dry scaly rash on his forearms, shins, and upper back. No evidence of infection. He states he has been using the Eucerin cream with minimal relief in the rash. He did start using a new shower soap and the last couple months. No other new lotions, detergents, or soaps. No other changes in his medications.  Plan - Advised pt to purchase a thick emollient like Vaseline. Apply a thin layer of Vaseline on affected areas at night and to wear long sleeves to cover the areas. He can use the Vaseline during the day as well. - No hot showers, only lukewarm water - Discussed only using unscented, non-dyed soaps, which includes shower soaps, laundry detergents, and dish soaps. - Recommended oatmeal bath if able - Follow-up with PCP on February 18 - If adherent to above regimen and no improvement in rash in 1-2 months, can consider dermatology referral for possible biopsy or other treatments

## 2017-02-18 NOTE — Progress Notes (Signed)
Internal Medicine Clinic Attending  Case discussed with Dr. Huang at the time of the visit.  We reviewed the resident's history and exam and pertinent patient test results.  I agree with the assessment, diagnosis, and plan of care documented in the resident's note. 

## 2017-03-11 ENCOUNTER — Encounter: Payer: Self-pay | Admitting: Student in an Organized Health Care Education/Training Program

## 2017-03-11 ENCOUNTER — Other Ambulatory Visit: Payer: Self-pay

## 2017-03-11 ENCOUNTER — Ambulatory Visit (INDEPENDENT_AMBULATORY_CARE_PROVIDER_SITE_OTHER): Payer: Medicare Other | Admitting: Student in an Organized Health Care Education/Training Program

## 2017-03-11 ENCOUNTER — Other Ambulatory Visit (HOSPITAL_COMMUNITY)
Admission: RE | Admit: 2017-03-11 | Discharge: 2017-03-11 | Disposition: A | Discharge status: Home / Self Care | Payer: Medicare Other | Source: Ambulatory Visit | Attending: Student in an Organized Health Care Education/Training Program | Admitting: Student in an Organized Health Care Education/Training Program

## 2017-03-11 VITALS — BP 106/71 | HR 55 | Temp 97.7°F | Wt 145.5 lb

## 2017-03-11 DIAGNOSIS — I480 Paroxysmal atrial fibrillation: Secondary | ICD-10-CM

## 2017-03-11 DIAGNOSIS — I5022 Chronic systolic (congestive) heart failure: Secondary | ICD-10-CM

## 2017-03-11 DIAGNOSIS — I10 Essential (primary) hypertension: Secondary | ICD-10-CM

## 2017-03-11 DIAGNOSIS — I482 Chronic atrial fibrillation, unspecified: Secondary | ICD-10-CM

## 2017-03-11 DIAGNOSIS — L309 Dermatitis, unspecified: Secondary | ICD-10-CM | POA: Diagnosis present

## 2017-03-11 DIAGNOSIS — Z79899 Other long term (current) drug therapy: Secondary | ICD-10-CM | POA: Diagnosis not present

## 2017-03-11 DIAGNOSIS — L539 Erythematous condition, unspecified: Secondary | ICD-10-CM | POA: Diagnosis present

## 2017-03-11 DIAGNOSIS — R21 Rash and other nonspecific skin eruption: Secondary | ICD-10-CM

## 2017-03-11 DIAGNOSIS — I110 Hypertensive heart disease with heart failure: Secondary | ICD-10-CM

## 2017-03-11 DIAGNOSIS — L308 Other specified dermatitis: Secondary | ICD-10-CM | POA: Diagnosis not present

## 2017-03-11 DIAGNOSIS — L83 Acanthosis nigricans: Secondary | ICD-10-CM | POA: Diagnosis not present

## 2017-03-11 MED ORDER — TRIAMCINOLONE ACETONIDE 0.1 % EX CREA: 1.0000 "application " | TOPICAL_CREAM | Freq: Two times a day (BID) | CUTANEOUS | 5 refills | Status: DC

## 2017-03-11 NOTE — Assessment & Plan Note (Signed)
Blood pressure well controlled today.  Plan to continue with amlodipine 10 mg once daily.

## 2017-03-11 NOTE — Assessment & Plan Note (Signed)
Long history of atrial fibrillation with rate control on low-dose metoprolol, which we will plan to continue.  Historically he has declined anticoagulation for primary prevention of stroke.  We talked about it again today, he continues to want to continue with only aspirin.  He is very hesitant to make any medication changes.  I advised that he is taking an increased risk by only using aspirin and not trying an anticoagulant.  Patient understands, plan to continue with aspirin 81 mg once daily.

## 2017-03-11 NOTE — Assessment & Plan Note (Signed)
History of mild eczema previously well treated with triamcinolone.  His current rash outbreak is clearly much worse and probably different from underlying eczema.  Differential includes a severe case of atopic dermatitis vs pityriasis. I obtained a skin biopsy today to rule out cutaneous T-cell lymphoma.  I represcribed triamcinolone 0.1% cream to be used twice daily for now.  I referred him to dermatology for further management.   Punch Biopsy Procedure Note  Pre-operative Diagnosis: Rash  Locations: Upper right back  Anesthesia: Lidocaine 1% without epinephrine   Procedure Details  History of allergy to lidocaine: no Sensitivity to epinephrine: no  Patient informed of the risks (including bleeding and infection) and benefits of the  procedure and verbal informed consent obtained.  The lesion and surrounding area was prepped with an alcohol swab. The skin was then stretched perpendicular to the skin tension lines and the lesion removed using the  57mmpunch. The resulting ellipse was then closed. Sterile dressing applied. The specimen was sent for pathologic examination. The patient tolerated the procedure well.  Condition: Stable  Complications: none.  Plan: 1. Instructed to keep the wound dry and covered for 24-48 hours and clean thereafter. 2. Patient instructed to apply Vaseline daily until healed. 3. Warning signs of infection were reviewed.

## 2017-03-11 NOTE — Patient Instructions (Signed)
1. We took a biopsy of your skin from your back today. The results will be back in about a week. I am referring you dermatology in the meantime (skin doctor). For now, use the prescription cream twice a day on your chest, arms, legs, and back.   2. Continue your other medications as prescribed.

## 2017-03-11 NOTE — Progress Notes (Signed)
   Assessment and Plan:  See Encounters tab for problem-based medical decision making.   __________________________________________________________  HPI:   77 year old man who comes in today for follow-up of a itchy skin rash.  He has a history of eczema with involvement over flexor surfaces, historically he has used topical steroids with good relief.  Over the last several months he has had progressively increasing rash over his arms, back, chest, and legs.  It is very dark in color and extremely itchy.  Does not hurts.  No drainage.  No fevers or chills recently.  Otherwise he is in his usual state of health.  Reports good compliance with medications.  No new medications.  He came to the acute care clinic and was told to use Vaseline to keep his skin moist which he says he has been doing.  Patient lives by himself.  He says that there has been bedbugs at his house before, but he does not think there are any right now.  He reports good compliance with his medications.  __________________________________________________________  Problem List: Patient Active Problem List   Diagnosis Date Noted  . Chronic systolic heart failure ([place]) 03/28/2015    Priority: High  . Alcohol use disorder 05/04/2009    Priority: High  . Atrial fibrillation ([place]) 04/27/2009    Priority: High  . Malignant neoplasm of bladder ([place]) 02/01/2006    Priority: High  . Rash 07/25/2015    Priority: Medium  . Essential hypertension 02/01/2006    Priority: Medium  . Gout 07/25/2015    Priority: Low  . Blindness of left eye 07/25/2015    Priority: Low  . Counseling regarding advanced directives and goals of care 03/28/2015    Priority: Low    Medications: Reconciled today in Epic __________________________________________________________  Physical Exam:  Vital Signs: Vitals:   03/11/17 0950  BP: 106/71  Pulse: (!) 55  Temp: 97.7 F (36.5 C)  TempSrc: Oral  SpO2: 100%  Weight: 145 lb 8 oz (66 kg)     Gen: Well appearing, NAD ENT: OP clear without erythema or exudate.  Neck: No cervical LAD, No thyromegaly or nodules, No JVD. Abd: Soft, NT, ND, normal BS.  Ext: Warm, no edema, normal joints Skin: Diffuse large patches of hyperpigmented, errythematous, rash with ill defined borders. There is thickening of the skin with diffuse papules consistent with inflammation. There is no scale, there is mild exoriations.

## 2017-03-13 ENCOUNTER — Telehealth: Payer: Self-pay | Admitting: Student in an Organized Health Care Education/Training Program

## 2017-03-13 DIAGNOSIS — R21 Rash and other nonspecific skin eruption: Secondary | ICD-10-CM

## 2017-03-13 NOTE — Telephone Encounter (Signed)
I called patient about skin biopsy results. Probably psoriasis or related reaction. Continue topical steroid, awaiting derm referral. Also, I asked him to come in for lab work, want to check RPR as soon as possible.

## 2017-03-15 ENCOUNTER — Other Ambulatory Visit (INDEPENDENT_AMBULATORY_CARE_PROVIDER_SITE_OTHER): Payer: Medicare Other

## 2017-03-15 DIAGNOSIS — R21 Rash and other nonspecific skin eruption: Secondary | ICD-10-CM

## 2017-03-19 DIAGNOSIS — L309 Dermatitis, unspecified: Secondary | ICD-10-CM | POA: Diagnosis not present

## 2017-03-20 ENCOUNTER — Telehealth: Payer: Self-pay | Admitting: Student in an Organized Health Care Education/Training Program

## 2017-03-20 NOTE — Telephone Encounter (Signed)
I called Tommy Morris to follow up on his diffuse rash. He says the rash is about the same, though less itchy. He saw [facility] yesterday, doesn't remember their diagnosis. They prescribed a cream, but he says he may not be able to get it until next pay day. They want him to come back in 3 weeks. I advised serum RPR was negative. I will look for the dermatology visit note to be faxed to me soon.

## 2017-03-21 LAB — RPR
RPR Ser Ql: NONREACTIVE
RPR Ser Ql: NONREACTIVE

## 2017-04-05 ENCOUNTER — Other Ambulatory Visit: Payer: Self-pay | Admitting: Student in an Organized Health Care Education/Training Program

## 2017-08-05 ENCOUNTER — Other Ambulatory Visit: Payer: Self-pay | Admitting: Student in an Organized Health Care Education/Training Program

## 2017-08-05 ENCOUNTER — Ambulatory Visit (INDEPENDENT_AMBULATORY_CARE_PROVIDER_SITE_OTHER): Payer: Medicare Other | Admitting: Student in an Organized Health Care Education/Training Program

## 2017-08-05 ENCOUNTER — Encounter (INDEPENDENT_AMBULATORY_CARE_PROVIDER_SITE_OTHER): Payer: Self-pay

## 2017-08-05 ENCOUNTER — Encounter: Payer: Self-pay | Admitting: Student in an Organized Health Care Education/Training Program

## 2017-08-05 ENCOUNTER — Other Ambulatory Visit: Payer: Self-pay

## 2017-08-05 VITALS — BP 111/63 | HR 68 | Temp 98.1°F | Ht 73.0 in | Wt 133.0 lb

## 2017-08-05 DIAGNOSIS — F1010 Alcohol abuse, uncomplicated: Secondary | ICD-10-CM

## 2017-08-05 DIAGNOSIS — L309 Dermatitis, unspecified: Secondary | ICD-10-CM

## 2017-08-05 DIAGNOSIS — I482 Chronic atrial fibrillation, unspecified: Secondary | ICD-10-CM

## 2017-08-05 DIAGNOSIS — C679 Malignant neoplasm of bladder, unspecified: Secondary | ICD-10-CM | POA: Diagnosis not present

## 2017-08-05 NOTE — Assessment & Plan Note (Signed)
She is stable to slightly improved on topical steroid.  He saw Dr. Ronnald Ramp with [facility] dermatology for a one-time visit in February and was diagnosed with dermatitis.  Rash looks about the same to me today but symptomatically is not as bothersome to him.  I advised that he follow back up with Dr. Ronnald Ramp as he might be a good candidate for UV phototherapy, especially just given the large area of this rash.

## 2017-08-05 NOTE — Assessment & Plan Note (Signed)
Stable, rate controlled.  Patient is at risk for stroke but he has decided not to do anticoagulation, and instead to use only aspirin 81 mg daily.  Plan to continue with metoprolol 25 mg twice daily.

## 2017-08-05 NOTE — Assessment & Plan Note (Signed)
High risk neoplasm which has not been followed up on in about 2 years.  He does not want to go back to urology does not want any further treatments.  Now with some unintentional weight loss will.  No hematuria.  We will plan to continue to monitor and keep him as comfortable as possible.  There is a risk here that this neoplasm is recurring.

## 2017-08-05 NOTE — Assessment & Plan Note (Signed)
Seems to be stable, drinking 2-3 drinks per day.  Will check CMP today to look for liver dysfunction as he has had elevated bilirubin in the past.

## 2017-08-05 NOTE — Progress Notes (Signed)
   Assessment and Plan:  See Encounters tab for problem-based medical decision making.   __________________________________________________________  HPI:   77 year old man here for follow-up of atrial fibrillation and rash.  Patient reports doing well since I last saw him about 6 months ago.  It is very diffuse inflammatory hyperpigmented rash on his arms, legs, and trunk.  We biopsied it and returned with a nonspecific dermatitis.  We arrange for him to follow-up with [name] dermatology and they have been treating him with triamcinolone.  He reports some improvement in the itchiness, but the hyperpigmentation has remained the same.  Advised that he follow back up with them and might be candidate for UV phototherapy, but the patient has not followed up.    [name] is a real minimalist with his health care.  He had a history of urothelial bladder cancer treated by urology and he stopped following up with their office around 2017 saying that he does not want any further treatment.  He has atrial fibrillation but is declined anticoagulation but instead only uses aspirin and metoprolol.  He has at least a mild alcohol use disorder, drinks 2-3 drinks per day.  He has no desire to quit.  He is having some unintentional weight loss, says he does not eat very much.  Denies any chest pain or shortness of breath.  Denies swelling on his legs or his abdomen.  He does not exercise very much, just walks around.  Says that he is functionally doing well.  Lives by himself.  __________________________________________________________  Problem List: Patient Active Problem List   Diagnosis Date Noted  . Chronic systolic heart failure ([place]) 03/28/2015    Priority: High  . Alcohol use disorder 05/04/2009    Priority: High  . Atrial fibrillation ([place]) 04/27/2009    Priority: High  . Malignant neoplasm of bladder ([place]) 02/01/2006    Priority: High  . Dermatitis 07/25/2015    Priority: Medium  .  Essential hypertension 02/01/2006    Priority: Medium  . Gout 07/25/2015    Priority: Low  . Blindness of left eye 07/25/2015    Priority: Low  . Counseling regarding advanced directives and goals of care 03/28/2015    Priority: Low    Medications: Reconciled today in Epic __________________________________________________________  Physical Exam:  Vital Signs: Vitals:   08/05/17 1042  BP: 111/63  Pulse: 68  Temp: 98.1 F (36.7 C)  TempSrc: Oral  SpO2: 99%  Weight: 133 lb (60.3 kg)  Height: 6\' 1"  (1.854 m)    Gen: Chronically ill-appearing man in no distress ENT: OP clear without erythema or exudate.  Neck: No cervical LAD, No thyromegaly or nodules, No JVD. CV: RRR, 3-6 early systolic murmur at the right upper sternal border Pulm: Normal effort, CTA throughout, no wheezing Ext: Warm, no edema, normal joints Skin: Diffuse hyperpigmented rash on his arms, legs, back, and chest/abdomen.  No papules or vesicles.  Not well demarcated.  Nonblanching.  No scale.

## 2017-08-05 NOTE — Patient Instructions (Addendum)
Please consider following back up with Dr. Ronnald Ramp with [facility] dermatology regarding your rash as he may have more treatments available to help you.  We will continue your medications as prescribed.  We will check some blood work today and will send you a letter with the results.

## 2017-08-06 ENCOUNTER — Encounter: Payer: Self-pay | Admitting: Student in an Organized Health Care Education/Training Program

## 2017-08-06 LAB — CMP14 + ANION GAP
A/G RATIO: 1 — AB (ref 1.2–2.2)
ALT: 20 IU/L (ref 0–44)
ANION GAP: 13 mmol/L (ref 10.0–18.0)
AST: 38 IU/L (ref 0–40)
Albumin: 3.7 g/dL (ref 3.5–4.8)
Alkaline Phosphatase: 194 IU/L — ABNORMAL HIGH (ref 39–117)
BUN/Creatinine Ratio: 24 (ref 10–24)
BUN: 20 mg/dL (ref 8–27)
Bilirubin Total: 0.6 mg/dL (ref 0.0–1.2)
CALCIUM: 9 mg/dL (ref 8.6–10.2)
CO2: 27 mmol/L (ref 20–29)
CREATININE: 0.85 mg/dL (ref 0.76–1.27)
Chloride: 104 mmol/L (ref 96–106)
GFR, EST AFRICAN AMERICAN: 96 mL/min/{1.73_m2} (ref >59)
GFR, EST NON AFRICAN AMERICAN: 83 mL/min/{1.73_m2} (ref >59)
GLOBULIN, TOTAL: 3.7 g/dL (ref 1.5–4.5)
Glucose: 85 mg/dL (ref 65–99)
POTASSIUM: 3.6 mmol/L (ref 3.5–5.2)
SODIUM: 144 mmol/L (ref 134–144)
TOTAL PROTEIN: 7.4 g/dL (ref 6.0–8.5)

## 2017-10-02 ENCOUNTER — Other Ambulatory Visit: Payer: Self-pay | Admitting: Student in an Organized Health Care Education/Training Program

## 2018-02-13 ENCOUNTER — Encounter: Payer: Self-pay | Admitting: *Deleted

## 2018-03-10 ENCOUNTER — Ambulatory Visit (INDEPENDENT_AMBULATORY_CARE_PROVIDER_SITE_OTHER): Payer: Medicare Other | Admitting: Student in an Organized Health Care Education/Training Program

## 2018-03-10 ENCOUNTER — Other Ambulatory Visit: Payer: Self-pay

## 2018-03-10 ENCOUNTER — Encounter: Payer: Self-pay | Admitting: Student in an Organized Health Care Education/Training Program

## 2018-03-10 ENCOUNTER — Ambulatory Visit: Payer: Medicare Other | Admitting: Student in an Organized Health Care Education/Training Program

## 2018-03-10 VITALS — BP 157/80 | HR 65 | Temp 97.6°F | Ht 73.0 in | Wt 139.1 lb

## 2018-03-10 DIAGNOSIS — Z7982 Long term (current) use of aspirin: Secondary | ICD-10-CM

## 2018-03-10 DIAGNOSIS — Z79899 Other long term (current) drug therapy: Secondary | ICD-10-CM

## 2018-03-10 DIAGNOSIS — L309 Dermatitis, unspecified: Secondary | ICD-10-CM

## 2018-03-10 DIAGNOSIS — I10 Essential (primary) hypertension: Secondary | ICD-10-CM

## 2018-03-10 DIAGNOSIS — I4811 Longstanding persistent atrial fibrillation: Secondary | ICD-10-CM | POA: Diagnosis not present

## 2018-03-10 DIAGNOSIS — Z9114 Patient's other noncompliance with medication regimen: Secondary | ICD-10-CM

## 2018-03-10 MED ORDER — TRIAMCINOLONE ACETONIDE 0.1 % EX OINT: TOPICAL_OINTMENT | Freq: Every day | CUTANEOUS | 3 refills | Status: DC

## 2018-03-10 MED ORDER — AMLODIPINE BESYLATE 10 MG PO TABS: 10.0000 mg | ORAL_TABLET | Freq: Every day | ORAL | 3 refills | Status: DC

## 2018-03-10 NOTE — Assessment & Plan Note (Signed)
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  We done a biopsy of this which ruled out vasculitis, malignancy, infection.  He seen dermatology.  Best treatment seems to be topical triamcinolone.  I went ahead and refilled this today.  Continue with this daily.

## 2018-03-10 NOTE — Progress Notes (Signed)
   Assessment and Plan:  See Encounters tab for problem-based medical decision making.   __________________________________________________________  HPI:   78 year old man here for follow-up of hypertension and atrial fibrillation.  Patient reports that he is doing well.  He lives by himself in a studio apartment.  His wife died several years ago.  He has other family that live in [place] and is active in his church.  He is independent in all his activities of daily living.  He does his own shopping, cooking, cleaning.  He gets out of the house every day.  He likes to see his friends.  He still drinks alcohol on a regular basis.  Reports good compliance with taking his medications up until 2 weeks ago when he ran out.  Denies any chest pain or pressure.  No shortness of breath.  Reports a good exertional capacity.  Denies any intermittent palpitations.  No dizziness or lightheadedness with standing.  No hematuria or abdominal pain.  Reports easily passing urine.  Denies any symptoms of depression.  Eating and drinking well.  Reports his weight is stable at home.  Able to drive, but does not have a car.  __________________________________________________________  Problem List: Patient Active Problem List   Diagnosis Date Noted  . Chronic systolic heart failure ([place]) 03/28/2015    Priority: High  . Alcohol use disorder 05/04/2009    Priority: High  . Atrial fibrillation ([place]) 04/27/2009    Priority: High  . Malignant neoplasm of bladder ([place]) 02/01/2006    Priority: High  . Dermatitis 07/25/2015    Priority: Medium  . Essential hypertension 02/01/2006    Priority: Medium  . Gout 07/25/2015    Priority: Low  . Blindness of left eye 07/25/2015    Priority: Low  . Counseling regarding advanced directives and goals of care 03/28/2015    Priority: Low    Medications: Reconciled today in Epic __________________________________________________________  Physical Exam:  Vital  Signs: Vitals:   03/10/18 0935  BP: (!) 157/80  Pulse: 65  Temp: 97.6 F (36.4 C)  TempSrc: Axillary  SpO2: 100%  Weight: 139 lb 1.6 oz (63.1 kg)    Gen: Well appearing, NAD Neck: No cervical LAD, No thyromegaly or nodules, No JVD. CV: Irregular rhythm, no murmur Pulm: Normal effort, CTA throughout, no wheezing Ext: Warm, no edema, normal joints Skin: Diffuse dark rash on his arms and trunk, irregular borders which are non-distinct, some thickening but without scale, nonblanching, no vesicles or papules

## 2018-03-10 NOTE — Patient Instructions (Signed)
I reordered your blood pressure medication and the scan appointment.  Please call me if you have any troubles in the future.

## 2018-03-10 NOTE — Assessment & Plan Note (Signed)
Stable longstanding persistent atrial fibrillation.  Previously on rate control with low-dose metoprolol, but he discontinued this himself.  Heart rate okay today.  No symptoms of orthostasis and functionally doing well at home.  Patient is at average risk for stroke due to the A. fib.  He has consistently declined anticoagulant prophylaxis.  He prefers to use aspirin alone.  We readdressed this today, his opinion has not changed.

## 2018-03-10 NOTE — Assessment & Plan Note (Signed)
Blood pressure elevated above goal today due to noncompliance with medication.  Ran out of medicine about 2 weeks ago.  I reordered amlodipine 10 mg to be used daily.  Renal function was normal about 6 months ago.  Follow-up with [place] in another 6 months.

## 2018-09-15 ENCOUNTER — Encounter: Payer: Self-pay | Admitting: Student in an Organized Health Care Education/Training Program

## 2018-09-15 ENCOUNTER — Other Ambulatory Visit: Payer: Self-pay

## 2018-09-15 ENCOUNTER — Ambulatory Visit (INDEPENDENT_AMBULATORY_CARE_PROVIDER_SITE_OTHER): Payer: Medicare Other | Admitting: Student in an Organized Health Care Education/Training Program

## 2018-09-15 VITALS — BP 112/78 | HR 66 | Temp 97.6°F | Ht 73.0 in | Wt 140.5 lb

## 2018-09-15 DIAGNOSIS — I4811 Longstanding persistent atrial fibrillation: Secondary | ICD-10-CM

## 2018-09-15 DIAGNOSIS — R011 Cardiac murmur, unspecified: Secondary | ICD-10-CM | POA: Diagnosis not present

## 2018-09-15 DIAGNOSIS — Z7982 Long term (current) use of aspirin: Secondary | ICD-10-CM | POA: Diagnosis not present

## 2018-09-15 DIAGNOSIS — I10 Essential (primary) hypertension: Secondary | ICD-10-CM

## 2018-09-15 DIAGNOSIS — I4891 Unspecified atrial fibrillation: Secondary | ICD-10-CM

## 2018-09-15 DIAGNOSIS — L409 Psoriasis, unspecified: Secondary | ICD-10-CM | POA: Diagnosis not present

## 2018-09-15 DIAGNOSIS — Z79899 Other long term (current) drug therapy: Secondary | ICD-10-CM | POA: Diagnosis not present

## 2018-09-15 DIAGNOSIS — M19012 Primary osteoarthritis, left shoulder: Secondary | ICD-10-CM | POA: Diagnosis not present

## 2018-09-15 MED ORDER — METOPROLOL SUCCINATE ER 25 MG PO TB24: 25.0000 mg | ORAL_TABLET | Freq: Every day | ORAL | 3 refills | Status: DC

## 2018-09-15 NOTE — Assessment & Plan Note (Signed)
New complaint of a mild painful left shoulder most consistent with AC joint arthritis.  Not function limiting.  No other signs of impingement syndrome.  Plan is for conservative care, NSAIDs as needed, as much activity as tolerated.

## 2018-09-15 NOTE — Assessment & Plan Note (Signed)
Blood pressure well controlled today.  Plan is to continue with amlodipine 10 mg daily.  I am going to change metoprolol tartrate 25 mg to metoprolol succinate 25 mg once daily as he only wants to take his pills 1 time in the morning.  Will check BMP today.

## 2018-09-15 NOTE — Progress Notes (Signed)
   Assessment and Plan:  See Encounters tab for problem-based medical decision making.   __________________________________________________________  HPI:   78 year old person here for follow-up of hypertension and atrial fibrillation.  Reports doing well at home.  Lives by himself, has some support from his daughter who lives locally.  He is independent in all his activities of daily living and lives independently.  He still drives himself, but his car recently broke down so now he is mostly walking or taking the bus.  Denies any chest pain or shortness of breath.  Denies any falls, no lightheadedness.  Reports good compliance with his medications.  Has had no recent illnesses, no hospitalizations, no ED visits.  Only other physician he sees is a dermatologist for his psoriasis which has been stable.  He is compliant with physical distancing guidelines sometimes.  Still likes to drink alcohol throughout the week, not every day, likes to go to the "full-time house" several times a week.  Says he would drink more if it was free.   __________________________________________________________  Problem List: Patient Active Problem List   Diagnosis Date Noted  . Chronic systolic heart failure ([place]) 03/28/2015    Priority: High  . Alcohol use disorder 05/04/2009    Priority: High  . Atrial fibrillation ([place]) 04/27/2009    Priority: High  . Malignant neoplasm of bladder ([place]) 02/01/2006    Priority: High  . Dermatitis 07/25/2015    Priority: Medium  . Essential hypertension 02/01/2006    Priority: Medium  . Arthritis of left acromioclavicular joint 09/15/2018    Priority: Low  . Gout 07/25/2015    Priority: Low  . Blindness of left eye 07/25/2015    Priority: Low  . Counseling regarding advanced directives and goals of care 03/28/2015    Priority: Low    Medications: Reconciled today in Epic __________________________________________________________  Physical Exam:  Vital  Signs: Vitals:   09/15/18 0840  BP: 112/78  Pulse: 66  Temp: 97.6 F (36.4 C)  TempSrc: Oral  SpO2: 100%  Weight: 140 lb 8 oz (63.7 kg)  Height: 6\' 1"  (1.854 m)    Gen: Well appearing, NAD Neck: No cervical LAD, No thyromegaly or nodules, No JVD. CV: RRR, 2 out of 6 early systolic murmur at the right upper sternal border Pulm: Normal effort, CTA throughout, no wheezing Abd: Soft, NT, N Ext: Warm, no edema, normal joints.  Normal range of motion of both shoulders with no signs of impingement.  There is no pain with active internal rotation, external rotation, or abduction.

## 2018-09-15 NOTE — Patient Instructions (Signed)
Today we talked about your high blood pressure which is under good control.  Please continue to use metoprolol and amlodipine once a day.  We will check your blood work today and I will send you a letter with the results.  We talked about your atrial fibrillation which seems to be doing well.  Continue taking metoprolol.  In the past you decided not to use a blood thinner to lower your risk of stroke.  Instead you were using aspirin 81 mg daily.  Let me know in the future if you change your mind.  You have a mild arthritis in your left shoulder, thankfully it does not seem to be affecting your function.  Continue to use your arm as able, let me know if the pain gets worse.

## 2018-09-15 NOTE — Assessment & Plan Note (Signed)
Symptomatically stable.  Plan to continue with metoprolol for rate control, I am going to change from a tartrate to succinate formulation because he only wants to take his pills once a day in the morning.  25 mg daily should be fine.  He is at elevated risk for stroke due to his advanced age, he declines anticoagulation, so instead using only aspirin 81 mg daily.

## 2018-09-16 ENCOUNTER — Encounter: Payer: Self-pay | Admitting: Student in an Organized Health Care Education/Training Program

## 2018-09-16 LAB — BMP8+ANION GAP
Anion Gap: 15 mmol/L (ref 10.0–18.0)
BUN/Creatinine Ratio: 20 (ref 10–24)
BUN: 15 mg/dL (ref 8–27)
CO2: 26 mmol/L (ref 20–29)
Calcium: 9.3 mg/dL (ref 8.6–10.2)
Chloride: 99 mmol/L (ref 96–106)
Creatinine, Ser: 0.76 mg/dL (ref 0.76–1.27)
GFR calc Af Amer: 100 mL/min/{1.73_m2} (ref >59)
GFR calc non Af Amer: 86 mL/min/{1.73_m2} (ref >59)
Glucose: 63 mg/dL — ABNORMAL LOW (ref 65–99)
Potassium: 3.6 mmol/L (ref 3.5–5.2)
Sodium: 140 mmol/L (ref 134–144)

## 2019-03-16 ENCOUNTER — Other Ambulatory Visit: Payer: Self-pay

## 2019-03-16 ENCOUNTER — Ambulatory Visit (INDEPENDENT_AMBULATORY_CARE_PROVIDER_SITE_OTHER): Payer: Medicare Other | Admitting: Student in an Organized Health Care Education/Training Program

## 2019-03-16 ENCOUNTER — Encounter: Payer: Self-pay | Admitting: Student in an Organized Health Care Education/Training Program

## 2019-03-16 ENCOUNTER — Other Ambulatory Visit: Payer: Self-pay | Admitting: *Deleted

## 2019-03-16 VITALS — BP 119/69 | HR 55 | Temp 97.6°F | Ht 73.0 in | Wt 146.0 lb

## 2019-03-16 DIAGNOSIS — Z7982 Long term (current) use of aspirin: Secondary | ICD-10-CM | POA: Diagnosis not present

## 2019-03-16 DIAGNOSIS — L309 Dermatitis, unspecified: Secondary | ICD-10-CM | POA: Diagnosis not present

## 2019-03-16 DIAGNOSIS — I10 Essential (primary) hypertension: Secondary | ICD-10-CM | POA: Diagnosis not present

## 2019-03-16 DIAGNOSIS — I4811 Longstanding persistent atrial fibrillation: Secondary | ICD-10-CM

## 2019-03-16 DIAGNOSIS — Z79899 Other long term (current) drug therapy: Secondary | ICD-10-CM

## 2019-03-16 MED ORDER — TRIAMCINOLONE ACETONIDE 0.1 % EX OINT: TOPICAL_OINTMENT | Freq: Every day | CUTANEOUS | 3 refills | Status: DC

## 2019-03-16 NOTE — Assessment & Plan Note (Signed)
Longstanding atrial fibrillation, in the past complicated by mildly reduced ejection fraction.  Functionally doing well right now with no symptoms of arrhythmia.  No chest pains.  We switched to metoprolol succinate 25 mg daily at last visit he is doing fine with that once a day medication.  We have talked about the risk of stroke at several visits, he continues to only want to use aspirin 81 mg daily for stroke prophylaxis, though I have counseled him frequently that anticoagulation would be more effective.

## 2019-03-16 NOTE — Patient Instructions (Signed)
Is great seeing you today in clinic.  You are doing very well.  Continue your medications as prescribed.  Call me if you have any new problems.  Follow-up with me in 6 months.  We talked about getting your landlord to fumigate your apartment to get rid of bedbugs.  I think this will help with your dry and itchy skin a lot.  It is the landlord responsibility to get rid of these bugs for you.  Let me know if I can be of any assistance.

## 2019-03-16 NOTE — Assessment & Plan Note (Signed)
Patient continues to have a chronic dermatitis.  Today it looks like he has bedbug infestation as I did find 1 insect on his clothes.  We talked about the importance of having his apartment fumigated to remove the bedbugs.  He is going to continue having itching if he does not.  Continue with Eucerin creams and other barrier protectant for now.  He has as needed triamcinolone to use as well.

## 2019-03-16 NOTE — Assessment & Plan Note (Signed)
Blood pressure is normal today.  Patient stopped taking amlodipine a while ago.  Plan to continue with metoprolol succinate 25 mg daily as this may also help with rate control for his atrial fibrillation.  Will discontinue amlodipine for now as he seems to be doing fine without it.  He is quite fragile because of his advanced age, so will not want to overtreat or raise his risk of falls at home.

## 2019-03-16 NOTE — Progress Notes (Signed)
   Assessment and Plan:  See Encounters tab for problem-based medical decision making.   __________________________________________________________  HPI:   79 year old person here for follow-up of atrial fibrillation.  Patient is doing well, lives independently by himself.  He is a minimalist when it comes to his healthcare.  Does not accept vaccines, not interested in the Covid vaccine.  Takes only 2 medications now, aspirin and metoprolol succinate.  Reports doing well on these agents with no falls or lightheadedness.  No chest pain.  No palpitations.  Continues to have itchy skin, uses barrier protectant which she says is effective.  I did find one bedbug on his pants today, we talked about the importance of getting these fumigated for control of his dermatitis.  __________________________________________________________  Problem List: Patient Active Problem List   Diagnosis Date Noted  . Chronic systolic heart failure ([place]) 03/28/2015    Priority: High  . Alcohol use disorder 05/04/2009    Priority: High  . Atrial fibrillation ([place]) 04/27/2009    Priority: High  . Malignant neoplasm of bladder ([place]) 02/01/2006    Priority: High  . Dermatitis 07/25/2015    Priority: Medium  . Essential hypertension 02/01/2006    Priority: Medium  . Arthritis of left acromioclavicular joint 09/15/2018    Priority: Low  . Gout 07/25/2015    Priority: Low  . Blindness of left eye 07/25/2015    Priority: Low  . Counseling regarding advanced directives and goals of care 03/28/2015    Priority: Low    Medications: Reconciled today in Epic __________________________________________________________  Physical Exam:  Vital Signs: Vitals:   03/16/19 0840  BP: 119/69  Pulse: (!) 55  Temp: 97.6 F (36.4 C)  TempSrc: Oral  SpO2: 100%  Weight: 146 lb (66.2 kg)  Height: 6\' 1"  (1.854 m)    Gen: Well appearing, NAD Neck: No cervical LAD, No thyromegaly or nodules, No JVD. Ext: Warm, no  edema, normal joints Skin: Bilateral hands have raised hyperpigmented plaques, no overlying scale, some erosions from scratching, consistent with insect bites. Neuro: Alert, conversational, slow gait, not wide-based, full strength in the upper lower extremities

## 2019-04-06 ENCOUNTER — Other Ambulatory Visit: Payer: Self-pay

## 2019-04-06 MED ORDER — METOPROLOL SUCCINATE ER 25 MG PO TB24: 25.0000 mg | ORAL_TABLET | Freq: Every day | ORAL | 3 refills | Status: DC

## 2019-04-06 NOTE — Telephone Encounter (Signed)
Requesting refill on amlodipine @  [facility] [phone] - [place], [place] [facility] [address] AT [place] [phone] (Phone) [phone] (Fax)

## 2019-04-06 NOTE — Telephone Encounter (Signed)
Pt is calling back regarding medicine [phone]

## 2019-04-08 ENCOUNTER — Telehealth: Payer: Self-pay

## 2019-04-08 NOTE — Telephone Encounter (Signed)
Received TC from patient, asking if b/p RX has been sent in, states he requested this 2 days ago.  Per chart, metoprolol was sent via [place] on 3/15, pt notified.  [facility] and is requesting RX at [facility].  Pt states he will have this RX transferred to [facility], RN will delete other [facility] from his pharmacy list. Laurence Compton, RN,BSN

## 2019-09-14 ENCOUNTER — Ambulatory Visit (INDEPENDENT_AMBULATORY_CARE_PROVIDER_SITE_OTHER): Payer: Medicare HMO | Admitting: Internal Medicine

## 2019-09-14 ENCOUNTER — Other Ambulatory Visit: Payer: Self-pay

## 2019-09-14 ENCOUNTER — Encounter: Payer: Self-pay | Admitting: Internal Medicine

## 2019-09-14 VITALS — BP 150/101 | HR 56 | Temp 97.9°F | Ht 73.0 in | Wt 143.2 lb

## 2019-09-14 DIAGNOSIS — I4811 Longstanding persistent atrial fibrillation: Secondary | ICD-10-CM | POA: Diagnosis not present

## 2019-09-14 DIAGNOSIS — I10 Essential (primary) hypertension: Secondary | ICD-10-CM | POA: Diagnosis not present

## 2019-09-14 DIAGNOSIS — Z0000 Encounter for general adult medical examination without abnormal findings: Secondary | ICD-10-CM | POA: Insufficient documentation

## 2019-09-14 MED ORDER — ASPIRIN 81 MG PO TBEC: 81.0000 mg | DELAYED_RELEASE_TABLET | Freq: Every day | ORAL | 2 refills | Status: AC

## 2019-09-14 NOTE — Assessment & Plan Note (Signed)
Encouraged patient to get COVID-19 vaccine. He is willing to get that. Information provided.

## 2019-09-14 NOTE — Patient Instructions (Signed)
Thank you for allowing [place] to provide your care today. Today we discussed your blood pressure, diet and your medications. Your blood pressure was elevated today, it can be due to your diet. As we talked, please try to decrease the amount of salty food.   Please continue taking your medications as before and follow up in clinic with Dr. Evette Doffing in next few weeks to recheck your blood pressure.  Today we made no changes to your medications.  I sent a refill for your Aspirin.   As always, if having severe symptoms, please seek medical attention at emergency room. Should you have any questions or concerns please call the internal medicine clinic at [phone].    Thank you!

## 2019-09-14 NOTE — Progress Notes (Signed)
   CC: 6 months F/u for HTN  HPI:  TommyTommy Morris is a 79 y.o. male with PMHx as documented below, presented for HTN follow up. Please refer to problem based charting for further details and assessment and plan of current problem and chronic medical conditions.  PMHx: HTN, A. 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 Atrial fibrillation ([name]) chronic    echo 05/03/2009 mildly to moderately reduced LV systolic function ef 50-38%  Patient declines anticoagulation for stroke prophylaxis.  [name] BPH (benign prostatic hypertrophy) with urinary obstruction   . Diverticulosis of colon   . History of bladder cancer    2006  S/P TURBT  UROTHELIAL CARCINOMA  . History of gout   . History of urinary retention    LAST ISSUE 11/2012 W/ ARF  . Hypertension   . Mild mitral valve prolapse   . Mitral valve regurgitation   . OA (osteoarthritis) of knee   . Pulmonary nodule    Two small right lung nodules on chest CT 04/25/2009   Review of Systems:  Review of Systems  Constitutional: Negative for chills and fever.  Genitourinary: Negative for frequency and urgency.  Neurological: Positive for dizziness. Negative for headaches.       Occasional dizziness   Physical Exam:  Vitals:   09/14/19 0914 09/14/19 0938  BP: (!) 164/101 (!) 150/101  Pulse: (!) 54 (!) 56  Temp: 97.9 F (36.6 C)   TempSrc: Oral   SpO2: 100%   Weight: 143 lb 3.2 oz (65 kg)   Height: 6\' 1"  (1.854 m)     Constitutional: No acute distress.  Cardiovascular:  Irregular heart rate, nl S1S2, no murmur,  no LEE Respiratory: Effort normal and breath sounds normal. No respiratory distress. No wheezes.  GI: Soft. No distension. There is no  tenderness.  Neurological: Is alert and oriented x 3  Skin: Not diaphoretic. No erythema.  Psychiatric: Normal mood and affect. Behavior is normal. Judgment and thought content normal.   Assessment & Plan:   See Encounters Tab for problem based charting.  Patient discussed with Dr. Rebeca Alert

## 2019-09-14 NOTE — Assessment & Plan Note (Signed)
Patient is on metoprolol succinate 25 mg daily for rate control. His HR has been 50s-60s on recent visits.  He denies any palpitation or chest pain.  Patient is also on aspirin 81 mg daily for stroke prophylaxis and is not on anticoagulation per his preference.  -Continue metoprolol succinate 25 mg daily and aspirin 81 mg daily (Sent refill for ASA)

## 2019-09-14 NOTE — Assessment & Plan Note (Signed)
BP today is 164/101. Rechecked: 150/101. No headache, dizziness or lightheadedness currently. (He has Hx of occasional dizziness) He mentions that he ate pork with cheese almost every day last week and thinks that it might have increased his BP and is willing to change his diet and eat healthier. He is on metoprolol succinate 25 mg daily for rate control of A. Fib. HR today 56. As mentioned, he has had occasional dizziness and high risk for fall and per prior note by his PCP (Dr. Evette Doffing), will not over treat his BP. Will continue current regimen and to follow up with PCP, Dr. Evette Doffing.  -Continue metoprolol succinate 25 mg daily -F/u with Dr. Evette Doffing

## 2019-09-15 NOTE — Progress Notes (Signed)
Internal Medicine Clinic Attending  Case discussed with Dr. Masoudi at the time of the visit.  We reviewed the resident's history and exam and pertinent patient test results.  I agree with the assessment, diagnosis, and plan of care documented in the resident's note.  Marcele Kosta, M.D., Ph.D.  

## 2019-10-05 ENCOUNTER — Encounter: Payer: Self-pay | Admitting: Student in an Organized Health Care Education/Training Program

## 2019-10-05 ENCOUNTER — Ambulatory Visit (INDEPENDENT_AMBULATORY_CARE_PROVIDER_SITE_OTHER): Payer: Medicare HMO | Admitting: Student in an Organized Health Care Education/Training Program

## 2019-10-05 ENCOUNTER — Other Ambulatory Visit: Payer: Self-pay

## 2019-10-05 VITALS — BP 150/102 | HR 72 | Temp 97.9°F | Ht 72.0 in | Wt 137.9 lb

## 2019-10-05 DIAGNOSIS — Z0001 Encounter for general adult medical examination with abnormal findings: Secondary | ICD-10-CM | POA: Diagnosis not present

## 2019-10-05 DIAGNOSIS — F1010 Alcohol abuse, uncomplicated: Secondary | ICD-10-CM

## 2019-10-05 DIAGNOSIS — I10 Essential (primary) hypertension: Secondary | ICD-10-CM

## 2019-10-05 DIAGNOSIS — Z0000 Encounter for general adult medical examination without abnormal findings: Secondary | ICD-10-CM

## 2019-10-05 MED ORDER — AMLODIPINE BESYLATE 5 MG PO TABS: 5.0000 mg | ORAL_TABLET | Freq: Every day | ORAL | 3 refills | Status: DC

## 2019-10-05 NOTE — Assessment & Plan Note (Signed)
I am happy to see that he has accepted the first dose of the mRNA Covid vaccine a few weeks ago.  Due for his next dose next week.  [facility].  Not interested in influenza vaccine.

## 2019-10-05 NOTE — Addendum Note (Signed)
Addended by: Truddie Crumble on: 10/05/2019 10:44 AM   Modules accepted: Orders

## 2019-10-05 NOTE — Progress Notes (Signed)
   Assessment and Plan:  See Encounters tab for problem-based medical decision making.   __________________________________________________________  HPI:   79 year old person here for follow-up of hypertension.  Doing okay at home, lives independently in an apartment by himself.  He is a widower.  Reports good functional status, independent in all activities of daily living.  Does his own shopping and cooking.  Reports recently cutting back on alcohol use, says he does not drink liquor anymore.  Drinks about 1 beer per day.  No recent falls.  No recent hospitalizations or emergency department visits.  Reports good adherence with medications.  He is a minimalist when it comes to his healthcare, only accepts aspirin for primary stroke prophylaxis from A. fib.  Has done okay on metoprolol as well.  After a lot of consideration he finally accepted the Covid vaccine about 2 weeks ago, he is adherent to physical distancing guidelines.  __________________________________________________________  Problem List: Patient Active Problem List   Diagnosis Date Noted  . Chronic systolic heart failure ([place]) 03/28/2015    Priority: High  . Alcohol use disorder 05/04/2009    Priority: High  . Atrial fibrillation ([place]) 04/27/2009    Priority: High  . Malignant neoplasm of bladder ([place]) 02/01/2006    Priority: High  . Dermatitis 07/25/2015    Priority: Medium  . Essential hypertension 02/01/2006    Priority: Medium  . Arthritis of left acromioclavicular joint 09/15/2018    Priority: Low  . Gout 07/25/2015    Priority: Low  . Blindness of left eye 07/25/2015    Priority: Low  . Counseling regarding advanced directives and goals of care 03/28/2015    Priority: Low  . Health care maintenance 09/14/2019    Medications: Reconciled today in [place] __________________________________________________________  Physical Exam:  Vital Signs: Vitals:   10/05/19 0904  BP: (!) 150/102  Pulse: 72  Temp:  97.9 F (36.6 C)  TempSrc: Oral  SpO2: 100%  Weight: 137 lb 14.4 oz (62.6 kg)  Height: 6' (1.829 m)    Gen: Thin, chronically ill-appearing man, fragile appearing Neck: No cervical LAD, No thyromegaly or nodules CV: RRR, 2 out of 6 early systolic murmur at the right upper sternal border Pulm: Normal effort, CTA throughout, no wheezing Ext: Warm, no edema, very thin legs with sarcopenia

## 2019-10-05 NOTE — Patient Instructions (Signed)
It was great seeing you today in clinic.  We talked about high blood pressure.  We decided to restart a blood pressure medicine called amlodipine, take 1 tablet once a day.  Continue with your other 2 medications as usual.  Call me if you have any difficulties.  Come and see me again in about 3 months and we will check up on your blood pressure.

## 2019-10-05 NOTE — Assessment & Plan Note (Signed)
Improved recently, stopped drinking liquor.  Continues to drink about 1 beer daily which we talked about as an acceptable amount given his health.

## 2019-10-05 NOTE — Assessment & Plan Note (Signed)
Elevated blood pressure, both systolic and diastolic readings over 2 office visits this year.  Perhaps related to changes in nutrition.  Plan is to restart amlodipine 5 mg daily.  He has been on this medication for several years in the past, then had a few years where he really did not need this extra blood pressure medicine.  Plan to check a BMP today.  Follow-up in 3 months.

## 2020-04-29 ENCOUNTER — Encounter: Payer: Self-pay | Admitting: *Deleted

## 2020-04-29 NOTE — Progress Notes (Unsigned)

## 2020-05-04 NOTE — Progress Notes (Unsigned)
Things That May Be Affecting Your Health:  Alcohol  Hearing loss  Pain    Depression  Home Safety  Sexual Health   Diabetes x Lack of physical activity  Stress   Difficulty with daily activities  Loneliness  Tiredness   Drug use x Medicines  Tobacco use   Falls  Motor Vehicle Safety  Weight   Food choices  Oral Health  Other    YOUR PERSONALIZED HEALTH PLAN : 1. Schedule your next subsequent Medicare Wellness visit in one year 2. Attend all of your regular appointments to address your medical issues 3. Complete the preventative screenings and services   Annual Wellness Visit   Medicare Covered Preventative Screenings and [place] Men and Women Who How Often Need? Date of Last Service Action  Abdominal Aortic Aneurysm Adults with AAA risk factors Once      Alcohol Misuse and Counseling All Adults Screening once a year if no alcohol misuse. Counseling up to 4 face to face sessions.     Bone Density Measurement  Adults at risk for osteoporosis Once every 2 yrs      Lipid Panel Z13.6 All adults without CV disease Once every 5 yrs       Colorectal Cancer   Stool sample or  Colonoscopy All adults 92 and older   Once every year  Every 10 years        Depression All Adults Once a year  Today   Diabetes Screening Blood glucose, post glucose load, or GTT Z13.1  All adults at risk  Pre-diabetics  Once per year  Twice per year      Diabetes  Self-Management Training All adults Diabetics 10 hrs first year; 2 hours subsequent years. Requires Copay     Glaucoma  Diabetics  Family history of glaucoma  African Americans 69 yrs +  Hispanic Americans 5 yrs + Annually - requires coppay      Hepatitis C Z72.89 or F19.20  High Risk for HCV  Born between 1945 and 1965  Annually  Once      HIV Z11.4 All adults based on risk  Annually btw ages 71 & 60 regardless of risk  Annually > 65 yrs if at increased risk      Lung Cancer Screening  Asymptomatic adults aged 37-77 with 30 pack yr history and current smoker OR quit within the last 15 yrs Annually Must have counseling and shared decision making documentation before first screen      Medical Nutrition Therapy Adults with   Diabetes  Renal disease  Kidney transplant within past 3 yrs 3 hours first year; 2 hours subsequent years     Obesity and Counseling All adults Screening once a year Counseling if BMI 30 or higher  Today   Tobacco Use Counseling Adults who use tobacco  Up to 8 visits in one year     Vaccines Z23  Hepatitis B  Influenza   Pneumonia  Adults   Once  Once every flu season  Two different vaccines separated by one year     Next Annual Wellness Visit People with Medicare Every year  Today     Services & Screenings Women Who How Often Need  Date of Last Service Action  Mammogram  Z12.31 Women over 78 One baseline ages 34-39. Annually ager 40 yrs+      Pap tests All women Annually if high risk. Every 2 yrs for normal risk women  Screening for cervical cancer with   Pap (Z01.419 nl or Z01.411abnl) &  HPV Z11.51 Women aged 20 to 91 Once every 5 yrs     Screening pelvic and breast exams All women Annually if high risk. Every 2 yrs for normal risk women     Sexually Transmitted Diseases  Chlamydia  Gonorrhea  Syphilis All at risk adults Annually for non pregnant females at increased risk         [place] Men Who How Ofter Need  Date of Last Service Action  Prostate Cancer - DRE & PSA Men over 50 Annually.  DRE might require a copay.        Sexually Transmitted Diseases  Syphilis All at risk adults Annually for men at increased risk      Health Maintenance List Health Maintenance  Topic Date Due  . COVID-19 Vaccine (2 - Pfizer risk 4-dose series) 10/12/2019  . INFLUENZA VACCINE  08/22/2020  . HPV VACCINES  Aged Out    Please check on covid vaccination status.

## 2020-05-13 ENCOUNTER — Other Ambulatory Visit: Payer: Self-pay | Admitting: Student in an Organized Health Care Education/Training Program

## 2020-05-16 ENCOUNTER — Other Ambulatory Visit: Payer: Self-pay | Admitting: Student in an Organized Health Care Education/Training Program

## 2020-05-17 ENCOUNTER — Telehealth: Payer: Self-pay

## 2020-05-17 NOTE — Telephone Encounter (Signed)
Tommy Morris with [facility] need to speak with a nurse. Please call back.

## 2020-05-17 NOTE — Telephone Encounter (Signed)
RTC to Diane w/ [employer], VM obtained and message left that triage nurse was returning call. SChaplin, RN,BSN

## 2020-05-17 NOTE — Telephone Encounter (Signed)
Received a return call from [place] w/ [employer] who states "this is a courtesy call to inform the PCP the patient had a positive Quantaflo screening which indicates moderate, bilateral PAD and the results will be faxed to [facility]".  Forwarding to PCP. SChaplin, RN,BSN

## 2020-05-18 NOTE — Telephone Encounter (Signed)
Ok. Thank you.

## 2020-07-04 ENCOUNTER — Encounter: Payer: Self-pay | Admitting: Student in an Organized Health Care Education/Training Program

## 2020-07-04 ENCOUNTER — Ambulatory Visit (INDEPENDENT_AMBULATORY_CARE_PROVIDER_SITE_OTHER): Payer: Medicare Other | Admitting: Student in an Organized Health Care Education/Training Program

## 2020-07-04 VITALS — BP 133/73 | HR 72 | Temp 98.2°F | Ht 73.0 in | Wt 149.1 lb

## 2020-07-04 DIAGNOSIS — I10 Essential (primary) hypertension: Secondary | ICD-10-CM

## 2020-07-04 DIAGNOSIS — L309 Dermatitis, unspecified: Secondary | ICD-10-CM

## 2020-07-04 DIAGNOSIS — I5022 Chronic systolic (congestive) heart failure: Secondary | ICD-10-CM

## 2020-07-04 DIAGNOSIS — F1010 Alcohol abuse, uncomplicated: Secondary | ICD-10-CM

## 2020-07-04 DIAGNOSIS — I4811 Longstanding persistent atrial fibrillation: Secondary | ICD-10-CM

## 2020-07-04 LAB — BRAIN NATRIURETIC PEPTIDE: B Natriuretic Peptide: 558.9 pg/mL — ABNORMAL HIGH (ref 0.0–100.0)

## 2020-07-04 NOTE — Assessment & Plan Note (Signed)
Patient with a history of chronic heart failure with mildly reduced ejection fraction.  No recent echocardiogram.  On exam today he does have bilateral lower extremity edema, mild distended jugular vein pulsation, but NYHA class I symptoms.  His functional status is acceptable to him.  No ischemic symptoms.  Patient wants to avoid testing and additional medications.  He declined repeat echocardiogram.  He declined addition of Lasix for his lower extremity edema, instead we talked about supportive care including compression stockings and elevation.  He avoids salt.  We will check labs today including a BNP.

## 2020-07-04 NOTE — Assessment & Plan Note (Signed)
History of mild alcohol use disorder.  Still drinks on a nearly daily basis.  Abdomen firm but I do not sense any hepatomegaly.  Plan to check liver enzyme testing and a CBC for platelets.  No other overt signs of liver fibrosis at this time though he is at risk.

## 2020-07-04 NOTE — Progress Notes (Signed)
   Assessment and Plan:  See Encounters tab for problem-based medical decision making.   __________________________________________________________  HPI:   80 year old person here for follow-up of atrial fibrillation and hypertension.  In about 6 months since I last seen him.  He has a pretty low functional status due to advanced age and frailty.  Lives by himself in an apartment.  Denies loneliness or depressed mood.  Reports good adherence with his medications.  Denies any recent falls.  No chest pain, dyspnea on exertion, or chest pressure.  Reports acceptable functional status, does all his own activities of daily living and does his own shopping.  No recent hospitalizations or emergency department visits.  Continues to drink alcohol on a near daily basis, says only 1 or 2 drinks per day.  No orthopnea, he has noticed some increased swelling in his both of his legs over the last few weeks.  __________________________________________________________  Problem List: Patient Active Problem List   Diagnosis Date Noted   Chronic systolic heart failure ([place]) 03/28/2015    Priority: High   Atrial fibrillation ([place]) 04/27/2009    Priority: High   Malignant neoplasm of bladder ([place]) 02/01/2006    Priority: High   Dermatitis 07/25/2015    Priority: Medium   Essential hypertension 02/01/2006    Priority: Medium   Gout 07/25/2015    Priority: Low   Blindness of left eye 07/25/2015    Priority: Low   Counseling regarding advanced directives and goals of care 03/28/2015    Priority: Low   Alcohol use disorder 05/04/2009    Priority: [place] care maintenance 09/14/2019    Medications: Reconciled today in Epic __________________________________________________________  Physical Exam:  Vital Signs: Vitals:   07/04/20 0952  BP: 133/73  Pulse: 72  Temp: 98.2 F (36.8 C)  TempSrc: Oral  SpO2: 100%  Weight: 149 lb 1.6 oz (67.6 kg)  Height: 6\' 1"  (1.854 m)    Gen: Frail  appearing Neck: No cervical LAD, No thyromegaly or nodules, JVD 3 cm above the sternal angle CV: Irregular rhythm, 2 out of 6 early systolic murmur at the right upper sternal border Pulm: Normal effort, CTA throughout, no wheezing Abd: Thin, scaphoid, firm abdomen, no hepatomegaly on percussion Ext: Warm, 1+ edema in both lower extremities, left leg looks a little worse than the right. Skin: Diffuse thickening of his skin on his arms with very small papules and some excoriations.

## 2020-07-04 NOTE — Assessment & Plan Note (Signed)
Patient with a long history of atrial fibrillation and mildly reduced ejection fraction.  Good functional status.  At risk for a stroke, patient does not accept anticoagulation but he does not use any baby aspirin a day.  Continues to be minimalistic about his approach to this problem.  Currently has acceptable functional status.  Plan to continue with current medications of metoprolol 25 mg daily and aspirin 81 mg daily.

## 2020-07-04 NOTE — Assessment & Plan Note (Signed)
Blood pressure control acceptable today.  Plan to continue with amlodipine 5 mg daily, may be causing some of the lower extremity edema.  He is wearing compression stockings.  We will check a BMP today.

## 2020-07-04 NOTE — Assessment & Plan Note (Signed)
Stable nonspecific dermatitis which is distributed diffusely.  He alternates between using Vaseline and triamcinolone ointment.

## 2020-07-05 ENCOUNTER — Encounter: Payer: Self-pay | Admitting: Student in an Organized Health Care Education/Training Program

## 2020-07-05 ENCOUNTER — Telehealth: Payer: Self-pay | Admitting: Student in an Organized Health Care Education/Training Program

## 2020-07-05 LAB — CMP14 + ANION GAP
ALT: 16 IU/L (ref 0–44)
AST: 33 IU/L (ref 0–40)
Albumin/Globulin Ratio: 1.1 — ABNORMAL LOW (ref 1.2–2.2)
Albumin: 3.6 g/dL (ref 3.6–4.6)
Alkaline Phosphatase: 133 IU/L — ABNORMAL HIGH (ref 44–121)
Anion Gap: 15 mmol/L (ref 10.0–18.0)
BUN/Creatinine Ratio: 20 (ref 10–24)
BUN: 19 mg/dL (ref 8–27)
Bilirubin Total: 0.7 mg/dL (ref 0.0–1.2)
CO2: 22 mmol/L (ref 20–29)
Calcium: 9 mg/dL (ref 8.6–10.2)
Chloride: 104 mmol/L (ref 96–106)
Creatinine, Ser: 0.93 mg/dL (ref 0.76–1.27)
Globulin, Total: 3.2 g/dL (ref 1.5–4.5)
Glucose: 82 mg/dL (ref 65–99)
Potassium: 3.7 mmol/L (ref 3.5–5.2)
Sodium: 141 mmol/L (ref 134–144)
Total Protein: 6.8 g/dL (ref 6.0–8.5)
eGFR: 82 mL/min/{1.73_m2} (ref >59)

## 2020-07-05 LAB — CBC
Hematocrit: 34.7 % — ABNORMAL LOW (ref 37.5–51.0)
Hemoglobin: 11.3 g/dL — ABNORMAL LOW (ref 13.0–17.7)
MCH: 31.4 pg (ref 26.6–33.0)
MCHC: 32.6 g/dL (ref 31.5–35.7)
MCV: 96 fL (ref 79–97)
Platelets: 220 10*3/uL (ref 150–450)
RBC: 3.6 x10E6/uL — ABNORMAL LOW (ref 4.14–5.80)
RDW: 13.1 % (ref 11.6–15.4)
WBC: 6 10*3/uL (ref 3.4–10.8)

## 2020-07-05 LAB — TSH: TSH: 2.02 u[IU]/mL (ref 0.450–4.500)

## 2020-07-05 LAB — LIPID PANEL
Chol/HDL Ratio: 2.6 ratio (ref 0.0–5.0)
Cholesterol, Total: 157 mg/dL (ref 100–199)
HDL: 61 mg/dL (ref >39)
LDL Chol Calc (NIH): 78 mg/dL (ref 0–99)
Triglycerides: 97 mg/dL (ref 0–149)
VLDL Cholesterol Cal: 18 mg/dL (ref 5–40)

## 2020-07-05 NOTE — Telephone Encounter (Signed)
Error

## 2020-07-11 ENCOUNTER — Encounter: Payer: Medicare HMO | Admitting: Student in an Organized Health Care Education/Training Program

## 2020-08-17 ENCOUNTER — Ambulatory Visit (INDEPENDENT_AMBULATORY_CARE_PROVIDER_SITE_OTHER): Payer: Medicare Other | Admitting: Internal Medicine

## 2020-08-17 ENCOUNTER — Encounter: Payer: Self-pay | Admitting: Internal Medicine

## 2020-08-17 DIAGNOSIS — I4811 Longstanding persistent atrial fibrillation: Secondary | ICD-10-CM

## 2020-08-17 DIAGNOSIS — I5022 Chronic systolic (congestive) heart failure: Secondary | ICD-10-CM

## 2020-08-17 DIAGNOSIS — I10 Essential (primary) hypertension: Secondary | ICD-10-CM | POA: Diagnosis not present

## 2020-08-17 DIAGNOSIS — F1010 Alcohol abuse, uncomplicated: Secondary | ICD-10-CM

## 2020-08-17 MED ORDER — FUROSEMIDE 20 MG PO TABS: 20.0000 mg | ORAL_TABLET | Freq: Two times a day (BID) | ORAL | 11 refills | Status: AC

## 2020-08-17 MED ORDER — FUROSEMIDE 20 MG PO TABS: 20.0000 mg | ORAL_TABLET | Freq: Two times a day (BID) | ORAL | 11 refills | Status: DC

## 2020-08-17 NOTE — Assessment & Plan Note (Signed)
Patient presents today with worsening bilateral lower extremity edema as well as 15+ pound weight gain. He denies orthopnoea, dyspnea on exertion, chest pain, fullness in his abdomen. Last echo was in 2011 and showed EF of 40-45%; patient declines repeat echo at this time.  - 07/04/2020 BNP 558.9. He decline lasix at that time and opted for compression stockings and elevating the extremity. - Patient counseled on benefit of lasix in helping pull extra fluid off and decrease work on his heart. He agreed to begin lasix 20 mg twice daily.  - Follow-up in one week to recheck BP, BMP, weight

## 2020-08-17 NOTE — Progress Notes (Signed)
CC: leg swelling  HPI:  [name] is a 80 y.o. male with past medical history as described below who presents today with worsening leg swelling over the last month and a half. He was seen in June at which time he was complaining of leg swelling but says that it has gotten worse since then. At that time he declined lasix and opted for compression stockings and elevating the legs. He has not had an echocardiogram since 2011 at which time he was found to have an EF of 40-45%. He denies any new or worsening symptoms aside from the leg swelling. He does state that he feels his recent weight gain is due to increased food consumption.  Past Medical History:  Diagnosis Date   Atrial fibrillation ([place]) chronic    echo 05/03/2009 mildly to moderately reduced LV systolic function ef [id number]  Patient declines anticoagulation for stroke prophylaxis.   BPH (benign prostatic hypertrophy) with urinary obstruction    Diverticulosis of colon    History of bladder cancer    2006  S/P TURBT  UROTHELIAL CARCINOMA   History of gout    History of urinary retention    LAST ISSUE 11/2012 W/ ARF   Hypertension    Mild mitral valve prolapse    Mitral valve regurgitation    OA (osteoarthritis) of knee    Pulmonary nodule    Two small right lung nodules on chest CT 04/25/2009   Review of Systems:  Review of Systems  Constitutional:  Positive for malaise/fatigue. Negative for chills, fever and weight loss.  Respiratory:  Negative for shortness of breath.   Cardiovascular:  Positive for leg swelling. Negative for chest pain, palpitations and orthopnea.  Gastrointestinal:  Negative for abdominal pain, constipation and diarrhea.  Genitourinary:  Negative for dysuria, frequency and urgency.  Skin:  Positive for itching.  Neurological:  Positive for dizziness. Negative for loss of consciousness, weakness and headaches.  Psychiatric/Behavioral:  Positive for substance abuse (alcohol). Negative for depression.  The patient is not nervous/anxious.     Physical Exam:  Vitals:   08/17/20 0911  BP: (!) 157/96  Pulse: 71  Temp: 97.8 F (36.6 C)  TempSrc: Oral  SpO2: 100%  Weight: 164 lb 12.8 oz (74.8 kg)   Physical Exam Vitals reviewed.  Constitutional:      Comments: Frail-appearing.  Neck:     Vascular: JVD (3 cm above clavicle, bilaterally) present.  Cardiovascular:     Rate and Rhythm: Normal rate. Rhythm irregularly irregular.     Pulses:          Radial pulses are 2+ on the right side and 2+ on the left side.       Posterior tibial pulses are 1+ on the right side and 1+ on the left side.     Heart sounds: Murmur (over mitral valve) heard.  Crescendo systolic murmur is present with a grade of 3/6.  Abdominal:     Palpations: Abdomen is soft.     Tenderness: There is no abdominal tenderness.  Musculoskeletal:        General: No tenderness.     Right lower leg: 1+ Pitting Edema present.     Left lower leg: 1+ Pitting Edema present.  Skin:    General: Skin is warm and dry.     Comments: Loss of skin integrity as a result of edema. Skin is dry and thickened over bilateral upper and lower extremities.  Neurological:     General: No  focal deficit present.     Mental Status: He is alert and oriented to person, place, and time.  Psychiatric:        Mood and Affect: Mood normal.     Assessment & Plan:   See Encounters Tab for problem based charting.  Patient seen with Dr. Jimmye Norman

## 2020-08-17 NOTE — Assessment & Plan Note (Signed)
Patient endorses daily consumption of [employer] with consumption of gin every 2-3 days. He says he's been drinking for 60 years.

## 2020-08-17 NOTE — Assessment & Plan Note (Signed)
Irregularly irregular rhythm noted on physical exam. Patient currently taking asa 81 mg and metoprolol 25 mg daily.

## 2020-08-17 NOTE — Addendum Note (Signed)
Addended by: Dorian Pod A on: 08/17/2020 11:09 AM   Modules accepted: Orders

## 2020-08-17 NOTE — Patient Instructions (Addendum)
Thank you for visiting the Internal Medicine Clinic today. It was a pleasure to meet you! Today we discussed your worsened leg swelling and weight gain over the last month and a half. I am concerned that your body is holding on to a lot of fluid and your heart is having trouble as a result.   I would like to have you start a medication called Lasix. You will take 20 mg, twice per day for one week. We will have you return to clinic in one week to recheck your weight, blood pressure, and kidney function at that time, and make medication changes based on those results. Please continue taking your other medications: amlodipine 5 mg, metoprolol 25 mg, aspirin 81 mg.  Remember: If you have any questions or concerns, please call our clinic at [phone] between 9am-5pm and after hours call [phone] and ask for the internal medicine resident on call. If you feel you are having a medical emergency please call 911.  Farrel Gordon, DO

## 2020-08-17 NOTE — Assessment & Plan Note (Signed)
Hypertensive on presentation today at 157/96. Patient states that he is out of one of his medications and is stopping at the pharmacy on his way home to pick it up.  - Continue amlodipine 5 mg once daily - Continue metoprolol 25 mg once daily

## 2020-08-21 NOTE — Progress Notes (Signed)
Internal Medicine Clinic Attending  I saw and evaluated the patient.  I personally confirmed the key portions of the history and exam documented by Dr. Marlou Sa and I reviewed pertinent patient test results.  The assessment, diagnosis, and plan were formulated together and I agree with the documentation in the resident's note.  Prominent finding on exam today is a significant systolic murmur over apex.  He declines echo.  Dermal LE edema is woody and loss of skin integrity (weeping) is intermittent.  Management w/o additional diagnostics (preference of patient) is possible, though he is at risk for skin complications of edema (I.e. venous insufficiency ulcerations).  Close f/u.

## 2020-08-24 ENCOUNTER — Encounter: Payer: Self-pay | Admitting: Internal Medicine

## 2020-08-24 ENCOUNTER — Ambulatory Visit (INDEPENDENT_AMBULATORY_CARE_PROVIDER_SITE_OTHER): Payer: Medicare Other | Admitting: Internal Medicine

## 2020-08-24 VITALS — BP 141/75 | HR 62 | Temp 98.2°F | Ht 73.0 in | Wt 154.2 lb

## 2020-08-24 DIAGNOSIS — I5022 Chronic systolic (congestive) heart failure: Secondary | ICD-10-CM | POA: Diagnosis not present

## 2020-08-24 NOTE — Patient Instructions (Signed)
Thank you for trusting me with your care. To recap, today we discussed the following:  Continue taking your lasix twice a day, if your legs get much better you can cut back to one pill once a day. Please follow up in 1 month for a doctor to evaluate you legs.

## 2020-08-24 NOTE — Progress Notes (Signed)
   CC: chronic systolic heart failure  HPI:Tommy Morris is a 80 y.o. male who presents for evaluation of chronic systolic heart failure. Please see individual problem based A/P for details.   Past Medical History:  Diagnosis Date   Atrial fibrillation ([place]) chronic    echo 05/03/2009 mildly to moderately reduced LV systolic function ef [id number]  Patient declines anticoagulation for stroke prophylaxis.   BPH (benign prostatic hypertrophy) with urinary obstruction    Diverticulosis of colon    History of bladder cancer    2006  S/P TURBT  UROTHELIAL CARCINOMA   History of gout    History of urinary retention    LAST ISSUE 11/2012 W/ ARF   Hypertension    Mild mitral valve prolapse    Mitral valve regurgitation    OA (osteoarthritis) of knee    Pulmonary nodule    Two small right lung nodules on chest CT 04/25/2009   Review of Systems:   Review of Systems  Respiratory:  Negative for cough, shortness of breath and wheezing.   Cardiovascular:  Positive for leg swelling. Negative for chest pain, orthopnea and PND.    Physical Exam: Vitals:   08/24/20 1433  BP: (!) 141/75  Pulse: 62  Temp: 98.2 F (36.8 C)  TempSrc: Oral  SpO2: 100%  Weight: 154 lb 3.2 oz (69.9 kg)  Height: '6\' 1"'$  (1.854 m)   General: Normal weight, sarcogenic, wearing glass , clear speech.   Neck: JVP 7-8 cm, no lymphadenopathy  Cardiovascular: irregular irregular pulse , 3/6 systolic murmur heard best at apex Pulmonary : Equal breath sounds, No wheezes, rales, or rhonchi Abdominal: soft, nontender,  bowel sounds present Ext: 1+ pitting edema to the shin, no tenderness to palpation of lower extremities.   Assessment & Plan:   See Encounters Tab for problem based charting.  Patient discussed with Dr. Angelia Mould

## 2020-08-25 ENCOUNTER — Encounter: Payer: Self-pay | Admitting: Internal Medicine

## 2020-08-25 LAB — BMP8+ANION GAP
Anion Gap: 13 mmol/L (ref 10.0–18.0)
BUN/Creatinine Ratio: 14 (ref 10–24)
BUN: 14 mg/dL (ref 8–27)
CO2: 27 mmol/L (ref 20–29)
Calcium: 8.6 mg/dL (ref 8.6–10.2)
Chloride: 101 mmol/L (ref 96–106)
Creatinine, Ser: 0.97 mg/dL (ref 0.76–1.27)
Glucose: 76 mg/dL (ref 65–99)
Potassium: 3.4 mmol/L — ABNORMAL LOW (ref 3.5–5.2)
Sodium: 141 mmol/L (ref 134–144)
eGFR: 78 mL/min/{1.73_m2} (ref >59)

## 2020-08-25 LAB — MAGNESIUM: Magnesium: 1.7 mg/dL (ref 1.6–2.3)

## 2020-08-25 NOTE — Assessment & Plan Note (Addendum)
Patient here for evaluation of his lower extremity edema most likely secondary to HFmrEF which his  PCP and him have treated conservatively given minimal symptoms. He is wearing compression style socks today. Reports taking Lasix as prescribed and is down 10 lbs from last visit. Has 1 + pitting edema up to mid shin, JVP 7-8 cm. No rales, no orthopnea, or PND.  NYHA class 1.  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 Review of TTE report from 2011 shows patient was in Afib at this time limiting evaluation for diastolic function. Other noted findings: EF 40-45%, diffuse hypokinesis, mild mitral valve prolapse , mild to mod mitral regurgitation. 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 Patient again is minimalist and no ischemic eval to my knowledge. Seems like this approach is working well for patient. He will continue low dose of  Lasix BID and we will check BMP today. Follow up in 1 month for evaluation of lower extremity edema and will need BMP. Likely can go back to following up in regular increments with PCP if labs and edema are stable at next visit.

## 2020-08-26 NOTE — Progress Notes (Signed)
Internal Medicine Clinic Attending  Case discussed with Dr. Steen  At the time of the visit.  We reviewed the resident's history and exam and pertinent patient test results.  I agree with the assessment, diagnosis, and plan of care documented in the resident's note.  

## 2020-10-14 ENCOUNTER — Other Ambulatory Visit: Payer: Self-pay

## 2020-10-14 MED ORDER — AMLODIPINE BESYLATE 5 MG PO TABS: 5.0000 mg | ORAL_TABLET | Freq: Every day | ORAL | 3 refills | Status: DC

## 2020-11-28 ENCOUNTER — Encounter: Payer: Self-pay | Admitting: *Deleted

## 2020-12-22 ENCOUNTER — Inpatient Hospital Stay (HOSPITAL_COMMUNITY): Payer: Medicare Other

## 2020-12-22 ENCOUNTER — Other Ambulatory Visit: Payer: Self-pay

## 2020-12-22 ENCOUNTER — Emergency Department (HOSPITAL_COMMUNITY): Payer: Medicare Other

## 2020-12-22 ENCOUNTER — Inpatient Hospital Stay (HOSPITAL_COMMUNITY)
Admission: EM | Admit: 2020-12-22 | Discharge: 2021-01-04 | DRG: 314 | Disposition: A | Discharge status: Skilled Nursing Facility | Payer: Medicare Other | Attending: Internal Medicine | Admitting: Internal Medicine

## 2020-12-22 ENCOUNTER — Encounter (HOSPITAL_COMMUNITY): Payer: Self-pay

## 2020-12-22 DIAGNOSIS — R9431 Abnormal electrocardiogram [ECG] [EKG]: Secondary | ICD-10-CM | POA: Diagnosis not present

## 2020-12-22 DIAGNOSIS — I4811 Longstanding persistent atrial fibrillation: Secondary | ICD-10-CM

## 2020-12-22 DIAGNOSIS — N138 Other obstructive and reflux uropathy: Secondary | ICD-10-CM | POA: Diagnosis present

## 2020-12-22 DIAGNOSIS — N401 Enlarged prostate with lower urinary tract symptoms: Secondary | ICD-10-CM | POA: Diagnosis present

## 2020-12-22 DIAGNOSIS — I5022 Chronic systolic (congestive) heart failure: Secondary | ICD-10-CM | POA: Diagnosis present

## 2020-12-22 DIAGNOSIS — Z7189 Other specified counseling: Secondary | ICD-10-CM | POA: Diagnosis not present

## 2020-12-22 DIAGNOSIS — Z8551 Personal history of malignant neoplasm of bladder: Secondary | ICD-10-CM

## 2020-12-22 DIAGNOSIS — R17 Unspecified jaundice: Secondary | ICD-10-CM

## 2020-12-22 DIAGNOSIS — G9341 Metabolic encephalopathy: Secondary | ICD-10-CM | POA: Diagnosis present

## 2020-12-22 DIAGNOSIS — E875 Hyperkalemia: Secondary | ICD-10-CM

## 2020-12-22 DIAGNOSIS — I4891 Unspecified atrial fibrillation: Secondary | ICD-10-CM | POA: Diagnosis present

## 2020-12-22 DIAGNOSIS — Z79899 Other long term (current) drug therapy: Secondary | ICD-10-CM

## 2020-12-22 DIAGNOSIS — E860 Dehydration: Secondary | ICD-10-CM | POA: Diagnosis present

## 2020-12-22 DIAGNOSIS — I110 Hypertensive heart disease with heart failure: Secondary | ICD-10-CM | POA: Diagnosis present

## 2020-12-22 DIAGNOSIS — Z9114 Patient's other noncompliance with medication regimen: Secondary | ICD-10-CM

## 2020-12-22 DIAGNOSIS — I5042 Chronic combined systolic (congestive) and diastolic (congestive) heart failure: Secondary | ICD-10-CM | POA: Diagnosis present

## 2020-12-22 DIAGNOSIS — K7031 Alcoholic cirrhosis of liver with ascites: Secondary | ICD-10-CM | POA: Diagnosis present

## 2020-12-22 DIAGNOSIS — I482 Chronic atrial fibrillation, unspecified: Secondary | ICD-10-CM | POA: Diagnosis present

## 2020-12-22 DIAGNOSIS — Z20822 Contact with and (suspected) exposure to covid-19: Secondary | ICD-10-CM | POA: Diagnosis present

## 2020-12-22 DIAGNOSIS — I10 Essential (primary) hypertension: Secondary | ICD-10-CM | POA: Diagnosis present

## 2020-12-22 DIAGNOSIS — N179 Acute kidney failure, unspecified: Secondary | ICD-10-CM | POA: Diagnosis not present

## 2020-12-22 DIAGNOSIS — M109 Gout, unspecified: Secondary | ICD-10-CM | POA: Diagnosis present

## 2020-12-22 DIAGNOSIS — R946 Abnormal results of thyroid function studies: Secondary | ICD-10-CM | POA: Diagnosis present

## 2020-12-22 DIAGNOSIS — I340 Nonrheumatic mitral (valve) insufficiency: Secondary | ICD-10-CM | POA: Diagnosis present

## 2020-12-22 DIAGNOSIS — Z515 Encounter for palliative care: Secondary | ICD-10-CM | POA: Diagnosis not present

## 2020-12-22 DIAGNOSIS — I3139 Other pericardial effusion (noninflammatory): Secondary | ICD-10-CM | POA: Diagnosis present

## 2020-12-22 DIAGNOSIS — D689 Coagulation defect, unspecified: Secondary | ICD-10-CM | POA: Diagnosis present

## 2020-12-22 DIAGNOSIS — R7989 Other specified abnormal findings of blood chemistry: Secondary | ICD-10-CM | POA: Diagnosis present

## 2020-12-22 DIAGNOSIS — I4819 Other persistent atrial fibrillation: Secondary | ICD-10-CM | POA: Diagnosis not present

## 2020-12-22 DIAGNOSIS — Z9079 Acquired absence of other genital organ(s): Secondary | ICD-10-CM

## 2020-12-22 DIAGNOSIS — R748 Abnormal levels of other serum enzymes: Secondary | ICD-10-CM | POA: Diagnosis present

## 2020-12-22 DIAGNOSIS — Z87891 Personal history of nicotine dependence: Secondary | ICD-10-CM

## 2020-12-22 DIAGNOSIS — R4182 Altered mental status, unspecified: Secondary | ICD-10-CM

## 2020-12-22 DIAGNOSIS — Z7982 Long term (current) use of aspirin: Secondary | ICD-10-CM

## 2020-12-22 DIAGNOSIS — I5082 Biventricular heart failure: Secondary | ICD-10-CM | POA: Diagnosis present

## 2020-12-22 DIAGNOSIS — D649 Anemia, unspecified: Secondary | ICD-10-CM | POA: Diagnosis not present

## 2020-12-22 DIAGNOSIS — F1010 Alcohol abuse, uncomplicated: Secondary | ICD-10-CM | POA: Diagnosis present

## 2020-12-22 DIAGNOSIS — R627 Adult failure to thrive: Secondary | ICD-10-CM | POA: Diagnosis present

## 2020-12-22 DIAGNOSIS — E876 Hypokalemia: Secondary | ICD-10-CM | POA: Diagnosis not present

## 2020-12-22 DIAGNOSIS — T465X6A Underdosing of other antihypertensive drugs, initial encounter: Secondary | ICD-10-CM | POA: Diagnosis present

## 2020-12-22 DIAGNOSIS — D61818 Other pancytopenia: Secondary | ICD-10-CM | POA: Diagnosis present

## 2020-12-22 DIAGNOSIS — R531 Weakness: Secondary | ICD-10-CM | POA: Diagnosis present

## 2020-12-22 LAB — CBC WITH DIFFERENTIAL/PLATELET
Abs Immature Granulocytes: 0.02 10*3/uL (ref 0.00–0.07)
Basophils Absolute: 0 10*3/uL (ref 0.0–0.1)
Basophils Relative: 0 %
Eosinophils Absolute: 0 10*3/uL (ref 0.0–0.5)
Eosinophils Relative: 0 %
HCT: 32.2 % — ABNORMAL LOW (ref 39.0–52.0)
Hemoglobin: 10.4 g/dL — ABNORMAL LOW (ref 13.0–17.0)
Immature Granulocytes: 0 %
Lymphocytes Relative: 5 %
Lymphs Abs: 0.3 10*3/uL — ABNORMAL LOW (ref 0.7–4.0)
MCH: 27.8 pg (ref 26.0–34.0)
MCHC: 32.3 g/dL (ref 30.0–36.0)
MCV: 86.1 fL (ref 80.0–100.0)
Monocytes Absolute: 0.2 10*3/uL (ref 0.1–1.0)
Monocytes Relative: 4 %
Neutro Abs: 5 10*3/uL (ref 1.7–7.7)
Neutrophils Relative %: 91 %
Platelets: 180 10*3/uL (ref 150–400)
RBC: 3.74 MIL/uL — ABNORMAL LOW (ref 4.22–5.81)
RDW: 20.5 % — ABNORMAL HIGH (ref 11.5–15.5)
WBC: 5.5 10*3/uL (ref 4.0–10.5)
nRBC: 1.3 % — ABNORMAL HIGH (ref 0.0–0.2)

## 2020-12-22 LAB — COMPREHENSIVE METABOLIC PANEL
ALT: 42 U/L (ref 0–44)
AST: 93 U/L — ABNORMAL HIGH (ref 15–41)
Albumin: 2.5 g/dL — ABNORMAL LOW (ref 3.5–5.0)
Alkaline Phosphatase: 118 U/L (ref 38–126)
Anion gap: 10 (ref 5–15)
BUN: 32 mg/dL — ABNORMAL HIGH (ref 8–23)
CO2: 23 mmol/L (ref 22–32)
Calcium: 9 mg/dL (ref 8.9–10.3)
Chloride: 108 mmol/L (ref 98–111)
Creatinine, Ser: 1.63 mg/dL — ABNORMAL HIGH (ref 0.61–1.24)
GFR, Estimated: 42 mL/min — ABNORMAL LOW (ref >60)
Glucose, Bld: 67 mg/dL — ABNORMAL LOW (ref 70–99)
Potassium: 5.7 mmol/L — ABNORMAL HIGH (ref 3.5–5.1)
Sodium: 141 mmol/L (ref 135–145)
Total Bilirubin: 4.4 mg/dL — ABNORMAL HIGH (ref 0.3–1.2)
Total Protein: 7.6 g/dL (ref 6.5–8.1)

## 2020-12-22 LAB — RESP PANEL BY RT-PCR (FLU A&B, COVID) ARPGX2
Influenza A by PCR: NEGATIVE
Influenza B by PCR: NEGATIVE
SARS Coronavirus 2 by RT PCR: NEGATIVE

## 2020-12-22 LAB — TSH: TSH: 5.858 u[IU]/mL — ABNORMAL HIGH (ref 0.350–4.500)

## 2020-12-22 LAB — ETHANOL: Alcohol, Ethyl (B): <10 mg/dL (ref <10)

## 2020-12-22 LAB — CK: Total CK: 919 U/L — ABNORMAL HIGH (ref 49–397)

## 2020-12-22 LAB — TROPONIN I (HIGH SENSITIVITY): Troponin I (High Sensitivity): 40 ng/L — ABNORMAL HIGH (ref <18)

## 2020-12-22 MED ORDER — HYDRALAZINE HCL 20 MG/ML IJ SOLN: 5.0000 mg | Freq: Four times a day (QID) | INTRAMUSCULAR | Status: DC | PRN

## 2020-12-22 MED ORDER — LACTATED RINGERS IV BOLUS
1000.0000 mL | Freq: Once | INTRAVENOUS | Status: AC
  Administered 2020-12-22: 1000 mL via INTRAVENOUS

## 2020-12-22 MED ORDER — THIAMINE HCL 100 MG PO TABS
100.0000 mg | ORAL_TABLET | Freq: Every day | ORAL | Status: DC
  Administered 2020-12-23 – 2020-12-24 (×2): 100 mg via ORAL
  Filled 2020-12-22 (×2): qty 1

## 2020-12-22 MED ORDER — THIAMINE HCL 100 MG/ML IJ SOLN
100.0000 mg | Freq: Every day | INTRAMUSCULAR | Status: DC
  Administered 2020-12-23: 100 mg via INTRAVENOUS
  Filled 2020-12-22: qty 2

## 2020-12-22 MED ORDER — ASPIRIN EC 81 MG PO TBEC
81.0000 mg | DELAYED_RELEASE_TABLET | Freq: Every day | ORAL | Status: DC
  Administered 2020-12-23 – 2021-01-04 (×12): 81 mg via ORAL
  Filled 2020-12-22 (×13): qty 1

## 2020-12-22 MED ORDER — SODIUM ZIRCONIUM CYCLOSILICATE 5 G PO PACK: 5.0000 g | PACK | Freq: Once | ORAL | Status: DC

## 2020-12-22 MED ORDER — ENOXAPARIN SODIUM 40 MG/0.4ML IJ SOSY
40.0000 mg | PREFILLED_SYRINGE | INTRAMUSCULAR | Status: DC
  Administered 2020-12-23 – 2020-12-29 (×7): 40 mg via SUBCUTANEOUS
  Filled 2020-12-22 (×7): qty 0.4

## 2020-12-22 MED ORDER — FOLIC ACID 1 MG PO TABS
1.0000 mg | ORAL_TABLET | Freq: Every day | ORAL | Status: DC
  Administered 2020-12-23 – 2021-01-04 (×12): 1 mg via ORAL
  Filled 2020-12-22 (×13): qty 1

## 2020-12-22 MED ORDER — LORAZEPAM 2 MG/ML IJ SOLN: 1.0000 mg | INTRAMUSCULAR | Status: DC | PRN

## 2020-12-22 MED ORDER — LORAZEPAM 1 MG PO TABS
1.0000 mg | ORAL_TABLET | ORAL | Status: DC | PRN
  Administered 2020-12-24: 1 mg via ORAL
  Filled 2020-12-22: qty 1

## 2020-12-22 MED ORDER — ADULT MULTIVITAMIN W/MINERALS CH
1.0000 | ORAL_TABLET | Freq: Every day | ORAL | Status: DC
  Administered 2020-12-23 – 2021-01-04 (×12): 1 via ORAL
  Filled 2020-12-22 (×13): qty 1

## 2020-12-22 MED ORDER — LACTATED RINGERS IV SOLN: INTRAVENOUS | Status: AC

## 2020-12-22 MED ORDER — AMLODIPINE BESYLATE 5 MG PO TABS
5.0000 mg | ORAL_TABLET | Freq: Every day | ORAL | Status: DC
  Administered 2020-12-23 – 2020-12-24 (×2): 5 mg via ORAL
  Filled 2020-12-22 (×2): qty 1

## 2020-12-22 MED ORDER — METOPROLOL SUCCINATE ER 50 MG PO TB24: 25.0000 mg | ORAL_TABLET | Freq: Every day | ORAL | Status: DC

## 2020-12-22 NOTE — ED Notes (Addendum)
Patients daughter would like a call with an update: Tommy Morris [phone]

## 2020-12-22 NOTE — ED Triage Notes (Signed)
Pt BIB EMS. Pt coming from home (lives alone) c/o dizziness/lightheadedness with ambulation. Per daughter who visits once a week pt has been declining for the last 2 weeks, pt is not eating or drinking and has increased weakness.   Bedbugs were spotted on pts clothing once here in ED.

## 2020-12-22 NOTE — H&P (Addendum)
History and Physical    Tommy Morris RKY:[id number] DOB: 09-Nov-1940 DOA: 12/22/2020  PCP: Axel Filler, MD  Patient coming from: Home  I have personally briefly reviewed patient's old medical records in [place]  Chief Complaint: Weakness  HPI: Tommy Morris is a 80 y.o. male with medical history significant for remote history of bladder cancer s/p transurethral resection in 3151, chronic systolic heart failure, atrial fibrillation nonintact with radiation, and hypertension who presents with concerns of weakness.  Patient lives alone and has family members that check on him.  He is unable to provide much history.  Daughter provides history at bedside. For the past 3 weeks he has been complaining of dizziness with ambulation. Then yesterday neighbors called her to go check on him since he was not seen walking outside like he usually does. She found him weak and unable to get up. Also looks like he hasn't been eating often. Found thanksgiving plate uneaten at home.  Has not been taking his blood pressure medication.  He reports "a few" drinks of liquor daily for 40 years. Could not tell me his last drink but denies it being this morning.  Denies any pain. Just that his legs are bad and have been that way for 40 years. Complains of skin itching. Has not been able to gain weight but denies weight loss.  Denies any chest pain or palpitation.  No shortness of breath.  Denies recent upper respiratory symptoms or cough or runny nose.  No fever.  Denies abdominal pain.  ED Course: He had a temperature of 99.2 F, hypertensive with BP of 130s over 114 on room air. No leukocytosis, hemoglobin of 10.4. Sodium 141, K of 5.7, creatinine of 1.63 from a prior of 0.97. AST elevated 93, ALT of 42, alkaline phosphatase normal at 118.  Total bilirubin of 4.4.  CK of 919 TSH of 5.85. EtOH level less than 10  Chest x-ray concerning for pericardial effusion.  Troponin elevated at 40.  Bedside  ultrasound was obtained by ED physician and confirmed pericardial effusion.  Cardiology was consulted and will see in consultation morning.  I requested EKG to be obtained and is pending at the time of admission.  Bedbugs on clothing noted by nursing staff.   Review of Systems: Pertinent positives and negatives as above.  Unable to obtain for ROS since patient is a poor historian.  Past Medical History:  Diagnosis Date   Atrial fibrillation ([place]) chronic    echo 05/03/2009 mildly to moderately reduced LV systolic function ef 76-16%  Patient declines anticoagulation for stroke prophylaxis.   BPH (benign prostatic hypertrophy) with urinary obstruction    Diverticulosis of colon    History of bladder cancer    2006  S/P TURBT  UROTHELIAL CARCINOMA   History of gout    History of urinary retention    LAST ISSUE 11/2012 W/ ARF   Hypertension    Mild mitral valve prolapse    Mitral valve regurgitation    OA (osteoarthritis) of knee    Pulmonary nodule    Two small right lung nodules on chest CT 04/25/2009    Past Surgical History:  Procedure Laterality Date   CYSTOSCOPY WITH RETROGRADE PYELOGRAM, URETEROSCOPY AND STENT PLACEMENT Bilateral 01/06/2013   Procedure: CYSTOSCOPY WITH BILATERAL RETROGRADE PYELOGRAM, ;  Surgeon: Irine Seal, MD;  Location: [place];  Service: Urology;  Laterality: Bilateral;   TONSILLECTOMY  AGE 53   TRANSTHORACIC ECHOCARDIOGRAM  05-03-2009  DIFFUSE HYPOKINESIS/ EF 40-45%/  MILD MVP WITH MILD TO MODERATE MR/  MILDLY DILATED LA & RA   TRANSURETHRAL RESECTION OF BLADDER TUMOR  10-26-2004   PLACEMENT RIGHT URETERAL STENT AND TRANSRECTAL PROSTATE BX   TRANSURETHRAL RESECTION OF BLADDER TUMOR WITH GYRUS (TURBT-GYRUS) N/A 01/06/2013   Procedure: TRANSURETHRAL RESECTION OF BLADDER TUMOR WITH GYRUS (TURBT-GYRUS);  Surgeon: Irine Seal, MD;  Location: [facility];  Service: Urology;  Laterality: N/A;   TRANSURETHRAL RESECTION OF PROSTATE  N/A 01/06/2013   Procedure: TRANSURETHRAL RESECTION OF THE PROSTATE (TURP);  Surgeon: Irine Seal, MD;  Location: [facility];  Service: Urology;  Laterality: N/A;     reports that he quit smoking about 18 years ago. His smoking use included cigarettes. He has a 10.00 pack-year smoking history. He quit smokeless tobacco use about 56 years ago.  His smokeless tobacco use included chew. He reports current alcohol use of about 21.0 standard drinks per week. He reports that he does not use drugs. Social History  No Known Allergies  Family History  Problem Relation Age of Onset   Alzheimer's disease Mother        Died in 83 at age 42.   Cancer Neg Hx    Diabetes Neg Hx    Hypertension Neg Hx    Heart disease Neg Hx      Prior to Admission medications   Medication Sig Start Date End Date Taking? Authorizing Provider  amLODipine (NORVASC) 5 MG tablet Take 1 tablet (5 mg total) by mouth daily. 10/14/20   Axel Filler, MD  aspirin 81 MG EC tablet Take 1 tablet (81 mg total) by mouth daily. 09/14/19   Masoudi, Dorthula Rue, MD  furosemide (LASIX) 20 MG tablet Take 1 tablet (20 mg total) by mouth 2 (two) times daily. 08/17/20 08/17/21  Angelica Pou, MD  metoprolol succinate (TOPROL-XL) 25 MG 24 hr tablet TAKE 1 TABLET(25 MG) BY MOUTH DAILY 05/13/20   Axel Filler, MD  Skin Protectants, Misc. (EUCERIN) cream Apply topically as needed for dry skin. 02/05/17   Axel Filler, MD  triamcinolone ointment (KENALOG) 0.1 % Apply topically daily. 03/16/19   Axel Filler, MD    Physical Exam: Vitals:   12/22/20 1807 12/22/20 1834 12/22/20 2000 12/22/20 2045  BP:  (!) 131/114 (!) 126/96 (!) 138/108  Pulse:  91 77 (!) 25  Resp:  (!) 22 16 17   Temp:  99.2 F (37.3 C)    TempSrc:  Axillary    SpO2:  100% 98% 95%  Weight: 69 kg     Height: 6\' 1"  (1.854 m)       Constitutional: Thin, cachectic chronically ill-appearing male sitting upright in  bed Vitals:   12/22/20 1807 12/22/20 1834 12/22/20 2000 12/22/20 2045  BP:  (!) 131/114 (!) 126/96 (!) 138/108  Pulse:  91 77 (!) 25  Resp:  (!) 22 16 17   Temp:  99.2 F (37.3 C)    TempSrc:  Axillary    SpO2:  100% 98% 95%  Weight: 69 kg     Height: 6\' 1"  (1.854 m)      Eyes: Clouding of the left conjunctiva with blindness ENMT: Mucous membranes are moist.  Neck: normal, supple Respiratory: clear to auscultation bilaterally, no wheezing, no crackles. Normal respiratory effort. No accessory muscle use.  Cardiovascular: Regular rate and rhythm, no murmurs / rubs / gallops. No extremity edema.  Abdomen: no tenderness, no masses palpated.  Bowel sounds positive.  Musculoskeletal: no clubbing / cyanosis.  Muscle wasting throughout. Skin: no rashes, lesions, ulcers.  Dry thick scaly skin on extremities. Neurologic: CN 2-12 grossly intact.  Strength of 3 out of 5 to lower extremities.   Psychiatric: Normal judgment and insight. Alert and oriented x 3. Normal mood.     Labs on Admission: I have personally reviewed following labs and imaging studies  CBC: Recent Labs  Lab 12/22/20 2009  WBC 5.5  NEUTROABS 5.0  HGB 10.4*  HCT 32.2*  MCV 86.1  PLT 440   Basic Metabolic Panel: Recent Labs  Lab 12/22/20 2009  NA 141  K 5.7*  CL 108  CO2 23  GLUCOSE 67*  BUN 32*  CREATININE 1.63*  CALCIUM 9.0   GFR: Estimated Creatinine Clearance: 34.1 mL/min (A) (by C-G formula based on SCr of 1.63 mg/dL (H)). Liver Function Tests: Recent Labs  Lab 12/22/20 2009  AST 93*  ALT 42  ALKPHOS 118  BILITOT 4.4*  PROT 7.6  ALBUMIN 2.5*   No results for input(s): LIPASE, AMYLASE in the last 168 hours. No results for input(s): AMMONIA in the last 168 hours. Coagulation Profile: No results for input(s): INR, PROTIME in the last 168 hours. Cardiac Enzymes: No results for input(s): CKTOTAL, CKMB, CKMBINDEX, TROPONINI in the last 168 hours. BNP (last 3 results) No results for input(s):  PROBNP in the last 8760 hours. HbA1C: No results for input(s): HGBA1C in the last 72 hours. CBG: No results for input(s): GLUCAP in the last 168 hours. Lipid Profile: No results for input(s): CHOL, HDL, LDLCALC, TRIG, CHOLHDL, LDLDIRECT in the last 72 hours. Thyroid Function Tests: Recent Labs    12/22/20 2009  TSH 5.858*   Anemia Panel: No results for input(s): VITAMINB12, FOLATE, FERRITIN, TIBC, IRON, RETICCTPCT in the last 72 hours. Urine analysis:    Component Value Date/Time   COLORURINE YELLOW 12/10/2012 0700   APPEARANCEUR CLOUDY (A) 12/10/2012 0700   LABSPEC 1.011 12/10/2012 0700   PHURINE 5.0 12/10/2012 0700   GLUCOSEU NEGATIVE 12/10/2012 0700   HGBUR LARGE (A) 12/10/2012 0700   BILIRUBINUR NEGATIVE 12/10/2012 0700   KETONESUR NEGATIVE 12/10/2012 0700   PROTEINUR NEGATIVE 12/10/2012 0700   UROBILINOGEN 0.2 12/10/2012 0700   NITRITE NEGATIVE 12/10/2012 0700   LEUKOCYTESUR NEGATIVE 12/10/2012 0700    Radiological Exams on Admission: DG Chest Portable 1 View  Result Date: 12/22/2020 CLINICAL DATA:  Evaluate for pneumonia. EXAM: PORTABLE CHEST 1 VIEW COMPARISON:  Chest x-ray 10/07/2014. FINDINGS: Cardiac silhouette is markedly enlarged and has increased in size. The lungs are clear. There is no pleural effusion or pneumothorax. No acute fractures are seen. IMPRESSION: 1. Marked enlargement of the cardiac silhouette. This has increased in size. Findings may related to pericardial effusion and/or worsening cardiomegaly. Electronically Signed   By: Ronney Asters M.D.   On: 12/22/2020 19:02      Assessment/Plan  Pericardial effusion  unclear etiology.  Hemodynamically stable without signs of cardiac tamponade.   -Flu and COVID PCR pending but no hx of URI symptoms.  -TSH elevated at 5.85.  Check free T4 -EKG shows known A.fib with T wave inversion to inferolateral leads which is similar to prior  -obtain echocardiogram -cardiology consulted and will see in the morning    Hyperbilirubinemia Elevated liver enzymes AST of 93, ALT 42, alkaline phosphatase normal 118, total bilirubin 3.4.  Suspect due to alcohol.  No abdominal findings on exam. -Check RUQ ultrasound  AKI Elevated CK Likely due to dehydration and chronic alcohol  use Creatinine elevated 1.63 from prior 0.97 Continuous gentle IV fluid hydration overnight  Hyperkalemia K of 5.7.  Likely due to worsening renal function Give one-time dose of Lokelma  Hypertension Uncontrolled due to noncompliance Resume home medication PRN hydralazine   Prolonged QT QTC of 585.  Hold beta-blocker for now now for further QT prolongation medication  Anemia-normocytic -Check iron panel, vitamin B12 folate  Alcohol use -Patient reports alcohol use for 40 years and currently drinks "a few" liquor drinks at home -place on CIWA  -give thiamine, MTV, and Folic   Chronic systolic HF  -compensated   Atrial fibrillation -documented to have declined anticoagulation in the past    DVT prophylaxis:.Lovenox Code Status: Full Family Communication: Plan discussed with patient and daughter at bedside  disposition Plan: Home with at least 2 midnight stays  Consults called: Cardiology Admission status: inpatient    Level of care: Telemetry  Status is: Inpatient  Remains inpatient appropriate because: Pericardial effusion with potential to become hemodynamically unstable requiring cardiology consult         Orene Desanctis DO [facility]   If 7PM-7AM, please contact night-coverage [email or web address]   12/22/2020, 10:02 PM

## 2020-12-22 NOTE — ED Provider Notes (Signed)
[facility] DEPT Provider Note   CSN: [id number] Arrival date & time: 12/22/20  1751     History Chief Complaint  Patient presents with   Dizziness   Dehydration    Tommy Morris is a 80 y.o. male with PMH chronic A. fib currently not on anticoagulation, HTN, BPH, heavy alcohol use who presents to the emergency department for evaluation of generalized fatigue and weakness.  Patient is a daily drinker for multiple decades and lives alone.  His daughter visits every week for church and she states that over the last 2 weeks he has been getting progressively more fatigued.  He slid off the couch today and was unable to get off the ground.  He arrives with altered mental status and additional review of systems unable to be obtained.  Of note, bedbugs found on the patient on arrival.   Dizziness     Past Medical History:  Diagnosis Date   Atrial fibrillation ([place]) chronic    echo 05/03/2009 mildly to moderately reduced LV systolic function ef 52-84%  Patient declines anticoagulation for stroke prophylaxis.   BPH (benign prostatic hypertrophy) with urinary obstruction    Diverticulosis of colon    History of bladder cancer    2006  S/P TURBT  UROTHELIAL CARCINOMA   History of gout    History of urinary retention    LAST ISSUE 11/2012 W/ ARF   Hypertension    Mild mitral valve prolapse    Mitral valve regurgitation    OA (osteoarthritis) of knee    Pulmonary nodule    Two small right lung nodules on chest CT 04/25/2009    Patient Active Problem List   Diagnosis Date Noted   Pericardial effusion 12/22/2020   AKI (acute kidney injury) ([place]) 12/22/2020   Health care maintenance 09/14/2019   Elevated bilirubin 03/19/2016   Gout 07/25/2015   Blindness of left eye 07/25/2015   Dermatitis 07/25/2015   Counseling regarding advanced directives and goals of care 13/24/4010   Chronic systolic heart failure ([place]) 03/28/2015   Alcohol use disorder  05/04/2009   Atrial fibrillation ([place]) 04/27/2009   Anemia 04/24/2009   Malignant neoplasm of bladder ([place]) 02/01/2006   Essential hypertension 02/01/2006    Past Surgical History:  Procedure Laterality Date   CYSTOSCOPY WITH RETROGRADE PYELOGRAM, URETEROSCOPY AND STENT PLACEMENT Bilateral 01/06/2013   Procedure: CYSTOSCOPY WITH BILATERAL RETROGRADE PYELOGRAM, ;  Surgeon: Irine Seal, MD;  Location: [place];  Service: Urology;  Laterality: Bilateral;   TONSILLECTOMY  AGE 50   TRANSTHORACIC ECHOCARDIOGRAM  05-03-2009   DIFFUSE HYPOKINESIS/ EF 40-45%/  MILD MVP WITH MILD TO MODERATE MR/  MILDLY DILATED LA & RA   TRANSURETHRAL RESECTION OF BLADDER TUMOR  10-26-2004   PLACEMENT RIGHT URETERAL STENT AND TRANSRECTAL PROSTATE BX   TRANSURETHRAL RESECTION OF BLADDER TUMOR WITH GYRUS (TURBT-GYRUS) N/A 01/06/2013   Procedure: TRANSURETHRAL RESECTION OF BLADDER TUMOR WITH GYRUS (TURBT-GYRUS);  Surgeon: Irine Seal, MD;  Location: [facility];  Service: Urology;  Laterality: N/A;   TRANSURETHRAL RESECTION OF PROSTATE N/A 01/06/2013   Procedure: TRANSURETHRAL RESECTION OF THE PROSTATE (TURP);  Surgeon: Irine Seal, MD;  Location: [facility];  Service: Urology;  Laterality: N/A;       Family History  Problem Relation Age of Onset   Alzheimer's disease Mother        Died in 87 at age 92.   Cancer Neg Hx    Diabetes Neg Hx  Hypertension Neg Hx    Heart disease Neg Hx     Social History   Tobacco Use   Smoking status: Former    Packs/day: 0.20    Years: 50.00    Pack years: 10.00    Types: Cigarettes    Quit date: 01/22/2002    Years since quitting: 18.9   Smokeless tobacco: Former    Types: Chew    Quit date: 01/23/1964  Substance Use Topics   Alcohol use: Yes    Alcohol/week: 21.0 standard drinks    Types: 21 Standard drinks or equivalent per week    Comment: Average of about 3 drinks per day.   Drug use: No    Home  Medications Prior to Admission medications   Medication Sig Start Date End Date Taking? Authorizing Provider  amLODipine (NORVASC) 5 MG tablet Take 1 tablet (5 mg total) by mouth daily. 10/14/20   Axel Filler, MD  aspirin 81 MG EC tablet Take 1 tablet (81 mg total) by mouth daily. 09/14/19   Masoudi, Dorthula Rue, MD  furosemide (LASIX) 20 MG tablet Take 1 tablet (20 mg total) by mouth 2 (two) times daily. 08/17/20 08/17/21  Angelica Pou, MD  metoprolol succinate (TOPROL-XL) 25 MG 24 hr tablet TAKE 1 TABLET(25 MG) BY MOUTH DAILY 05/13/20   Axel Filler, MD  Skin Protectants, Misc. (EUCERIN) cream Apply topically as needed for dry skin. 02/05/17   Axel Filler, MD  triamcinolone ointment (KENALOG) 0.1 % Apply topically daily. 03/16/19   Axel Filler, MD    Allergies    Patient has no known allergies.  Review of Systems   Review of Systems  Unable to perform ROS: Mental status change  Neurological:  Positive for dizziness.   Physical Exam Updated Vital Signs BP (!) 138/108   Pulse (!) 25   Temp 99.2 F (37.3 C) (Axillary)   Resp 17   Ht 6\' 1"  (1.854 m)   Wt 69 kg   SpO2 95%   BMI 20.07 kg/m   Physical Exam Vitals and nursing note reviewed.  Constitutional:      General: He is not in acute distress.    Appearance: He is well-developed.  HENT:     Head: Normocephalic and atraumatic.  Eyes:     Conjunctiva/sclera: Conjunctivae normal.  Cardiovascular:     Rate and Rhythm: Normal rate and regular rhythm.     Heart sounds: No murmur heard. Pulmonary:     Effort: Pulmonary effort is normal. No respiratory distress.     Breath sounds: Normal breath sounds.  Abdominal:     Palpations: Abdomen is soft.     Tenderness: There is no abdominal tenderness.  Musculoskeletal:        General: No swelling.     Cervical back: Neck supple.  Skin:    General: Skin is warm and dry.     Capillary Refill: Capillary refill takes less than 2  seconds.  Neurological:     Mental Status: He is alert.  Psychiatric:        Mood and Affect: Mood normal.    ED Results / Procedures / Treatments   Labs (all labs ordered are listed, but only abnormal results are displayed) Labs Reviewed  COMPREHENSIVE METABOLIC PANEL - Abnormal; Notable for the following components:      Result Value   Potassium 5.7 (*)    Glucose, Bld 67 (*)    BUN 32 (*)    Creatinine, Ser 1.63 (*)  Albumin 2.5 (*)    AST 93 (*)    Total Bilirubin 4.4 (*)    GFR, Estimated 42 (*)    All other components within normal limits  CBC WITH DIFFERENTIAL/PLATELET - Abnormal; Notable for the following components:   RBC 3.74 (*)    Hemoglobin 10.4 (*)    HCT 32.2 (*)    RDW 20.5 (*)    nRBC 1.3 (*)    Lymphs Abs 0.3 (*)    All other components within normal limits  TSH - Abnormal; Notable for the following components:   TSH 5.858 (*)    All other components within normal limits  CK - Abnormal; Notable for the following components:   Total CK 919 (*)    All other components within normal limits  TROPONIN I (HIGH SENSITIVITY) - Abnormal; Notable for the following components:   Troponin I (High Sensitivity) 40 (*)    All other components within normal limits  RESP PANEL BY RT-PCR (FLU A&B, COVID) ARPGX2  URINE CULTURE  ETHANOL  URINALYSIS, ROUTINE W REFLEX MICROSCOPIC  T4, FREE  IRON AND TIBC  VITAMIN B12  FOLATE  BASIC METABOLIC PANEL  TROPONIN I (HIGH SENSITIVITY)    EKG None  Radiology DG Chest Portable 1 View  Result Date: 12/22/2020 CLINICAL DATA:  Evaluate for pneumonia. EXAM: PORTABLE CHEST 1 VIEW COMPARISON:  Chest x-ray 10/07/2014. FINDINGS: Cardiac silhouette is markedly enlarged and has increased in size. The lungs are clear. There is no pleural effusion or pneumothorax. No acute fractures are seen. IMPRESSION: 1. Marked enlargement of the cardiac silhouette. This has increased in size. Findings may related to pericardial effusion and/or  worsening cardiomegaly. Electronically Signed   By: Ronney Asters M.D.   On: 12/22/2020 19:02   US Abdomen Limited RUQ (LIVER/GB)  Result Date: 12/22/2020 CLINICAL DATA:  Elevated bilirubin EXAM: ULTRASOUND ABDOMEN LIMITED RIGHT UPPER QUADRANT COMPARISON:  CT 03/30/2014 FINDINGS: Gallbladder: No gallstones or wall thickening visualized. No sonographic Murphy sign noted by sonographer. Common bile duct: Diameter: 3 mm Liver: Nodular liver contour suspicious for cirrhosis. No focal hepatic mass lesion is seen. There is antegrade flow in the main portal vein. Other: Multiple cysts in the right kidney. 5.8 by 5.7 x 4.8 cm mildly complicated right renal cyst contains a small amount of internal debris. IMPRESSION: 1. Cirrhotic morphology of the liver with ascites in the right upper quadrant 2. Negative for gallstones or biliary dilatation 3. Multiple right renal cysts with slightly complex exophytic cyst off the lower pole of right kidney. Dedicated renal sonogram may be obtained for more complete assessment. Electronically Signed   By: Donavan Foil M.D.   On: 12/22/2020 22:58    Procedures Procedures   Medications Ordered in ED Medications  lactated ringers bolus 1,000 mL (has no administration in time range)  LORazepam (ATIVAN) tablet 1-4 mg (has no administration in time range)    Or  LORazepam (ATIVAN) injection 1-4 mg (has no administration in time range)  thiamine tablet 100 mg (has no administration in time range)    Or  thiamine (B-1) injection 100 mg (has no administration in time range)  folic acid (FOLVITE) tablet 1 mg (has no administration in time range)  multivitamin with minerals tablet 1 tablet (has no administration in time range)  lactated ringers infusion (has no administration in time range)  enoxaparin (LOVENOX) injection 40 mg (has no administration in time range)  hydrALAZINE (APRESOLINE) injection 5 mg (has no administration in time range)  amLODipine (  NORVASC) tablet 5 mg  (has no administration in time range)  aspirin EC tablet 81 mg (has no administration in time range)  sodium zirconium cyclosilicate (LOKELMA) packet 5 g (has no administration in time range)    ED Course  I have reviewed the triage vital signs and the nursing notes.  Pertinent labs & imaging results that were available during my care of the patient were reviewed by me and considered in my medical decision making (see chart for details).    MDM Rules/Calculators/A&P                           Patient seen emergency department for evaluation of generalized weakness and altered mental status.  Physical exam largely unremarkable.  Laboratory evaluation with mild hyperkalemia 5.7, creatinine elevation 1.63, BUN 32, AST elevated to 93, total bili 4.4 which may be elevated in the setting of impending liver disease due to his alcohol use.  Hemoglobin 10.4 but with a normal MCV of 86.1, troponin elevated to 40 likely type II demand ischemia.  CK elevated to 919.  Chest x-ray with marked cardiomegaly and concern for possible pericardial effusion.  I placed a bedside ultrasound on the patient and did see a moderate sized pericardial effusion with no right ventricular collapse but his IVC was dilated and not coapting on sniff test.  Cardiology was consulted who will round on the patient tomorrow morning and due to patient stable vital signs there is less concern for tamponade at this time.  Patient will require admission for further work-up of his new pericardial Effusion and generalized weakness.  Patient then admitted to medicine. Final Clinical Impression(s) / ED Diagnoses Final diagnoses:  Total bilirubin, elevated    Rx / DC Orders ED Discharge Orders     None        Damany Eastman, Debe Coder, MD 12/22/20 2336

## 2020-12-23 ENCOUNTER — Inpatient Hospital Stay (HOSPITAL_COMMUNITY): Payer: Medicare Other

## 2020-12-23 DIAGNOSIS — N179 Acute kidney failure, unspecified: Secondary | ICD-10-CM | POA: Diagnosis not present

## 2020-12-23 DIAGNOSIS — I3139 Other pericardial effusion (noninflammatory): Secondary | ICD-10-CM

## 2020-12-23 DIAGNOSIS — D649 Anemia, unspecified: Secondary | ICD-10-CM | POA: Diagnosis not present

## 2020-12-23 DIAGNOSIS — F1010 Alcohol abuse, uncomplicated: Secondary | ICD-10-CM | POA: Diagnosis not present

## 2020-12-23 LAB — BASIC METABOLIC PANEL
Anion gap: 13 (ref 5–15)
BUN: 33 mg/dL — ABNORMAL HIGH (ref 8–23)
CO2: 22 mmol/L (ref 22–32)
Calcium: 8.7 mg/dL — ABNORMAL LOW (ref 8.9–10.3)
Chloride: 109 mmol/L (ref 98–111)
Creatinine, Ser: 1.57 mg/dL — ABNORMAL HIGH (ref 0.61–1.24)
GFR, Estimated: 44 mL/min — ABNORMAL LOW (ref >60)
Glucose, Bld: 62 mg/dL — ABNORMAL LOW (ref 70–99)
Potassium: 3 mmol/L — ABNORMAL LOW (ref 3.5–5.1)
Sodium: 144 mmol/L (ref 135–145)

## 2020-12-23 LAB — TROPONIN I (HIGH SENSITIVITY): Troponin I (High Sensitivity): 43 ng/L — ABNORMAL HIGH (ref <18)

## 2020-12-23 LAB — URINALYSIS, ROUTINE W REFLEX MICROSCOPIC
Bilirubin Urine: NEGATIVE
Glucose, UA: NEGATIVE mg/dL
Hgb urine dipstick: NEGATIVE
Ketones, ur: NEGATIVE mg/dL
Leukocytes,Ua: NEGATIVE
Nitrite: NEGATIVE
Protein, ur: 100 mg/dL — AB
Specific Gravity, Urine: 1.016 (ref 1.005–1.030)
pH: 5 (ref 5.0–8.0)

## 2020-12-23 LAB — ECHOCARDIOGRAM COMPLETE
Area-P 1/2: 5.82 cm2
Calc EF: 36.2 %
Height: 73 in
MV M vel: 5.39 m/s
MV Peak grad: 116.2 mmHg
P 1/2 time: 721 msec
Radius: 0.6 cm
S' Lateral: 4.7 cm
Single Plane A2C EF: 27.5 %
Single Plane A4C EF: 41.5 %
Weight: 2433.88 oz

## 2020-12-23 LAB — VITAMIN B12: Vitamin B-12: 1375 pg/mL — ABNORMAL HIGH (ref 180–914)

## 2020-12-23 LAB — IRON AND TIBC
Iron: 18 ug/dL — ABNORMAL LOW (ref 45–182)
Saturation Ratios: 5 % — ABNORMAL LOW (ref 17.9–39.5)
TIBC: 352 ug/dL (ref 250–450)
UIBC: 334 ug/dL

## 2020-12-23 LAB — T4, FREE: Free T4: 0.96 ng/dL (ref 0.61–1.12)

## 2020-12-23 LAB — FOLATE: Folate: 6.8 ng/mL (ref >5.9)

## 2020-12-23 MED ORDER — LACTATED RINGERS IV SOLN: INTRAVENOUS | Status: AC

## 2020-12-23 MED ORDER — POTASSIUM CHLORIDE CRYS ER 20 MEQ PO TBCR
40.0000 meq | EXTENDED_RELEASE_TABLET | Freq: Once | ORAL | Status: AC
  Administered 2020-12-23: 40 meq via ORAL
  Filled 2020-12-23: qty 2

## 2020-12-23 NOTE — Plan of Care (Signed)
  Problem: Education: Goal: Knowledge of General Education information will improve Description: Including pain rating scale, medication(s)/side effects and non-pharmacologic comfort measures Outcome: Progressing   Problem: Health Behavior/Discharge Planning: Goal: Ability to manage health-related needs will improve Outcome: Progressing   Problem: Clinical Measurements: Goal: Will remain free from infection Outcome: Progressing Goal: Respiratory complications will improve Outcome: Completed/Met

## 2020-12-23 NOTE — Progress Notes (Addendum)
PROGRESS NOTE  Tommy Morris EXN:[id number] DOB: [birth year] DOA: 12/22/2020 PCP: Axel Filler, MD   LOS: 1 day   Brief narrative: Tommy Morris is a 80 y.o. male with medical history significant for remote history of bladder cancer s/p transurethral resection in 4967, chronic systolic heart failure, atrial fibrillation nonintact with radiation, and hypertension presented to hospital with generalized weakness.  Patient lives by himself at home and family had checked on him.  For the last 3 weeks patient has been complaining of dizziness and difficulty with ambulation with decreased oral intake.  Patient does have history of alcohol consumption.  In the ED, patient was noted to have temperature of 99.2 F.  Patient did not have any leukocytosis.  Potassium was slightly elevated at 5.7.  Creatinine 1.6 from prior 0.9.  AST was elevated at 93 ALT of 42.  Total bilirubin of 4.4.  CK level was 919.  TSH of 5.8.  Chest x-ray showed a concerning for pericardial effusion.  Troponin was elevated at 40.  Bedside ultrasound was obtained in the ED which showed pericardial effusion.  Cardiology was consulted and patient was admitted to hospital with further evaluation and treatment.  Assessment/Plan:  Principal Problem:   Pericardial effusion Active Problems:   Anemia   Alcohol use disorder   Essential hypertension   Atrial fibrillation (HCC)   Chronic systolic heart failure (HCC)   Elevated bilirubin   AKI (acute kidney injury) ([place])  Generalized weakness, possible failure to thrive.  We will get nutrition evaluation.  Get PT evaluation as well.  Pericardial effusion  Unclear etiology.  No evidence of hemodynamic instability.  Flu and COVID was negative. TSH elevated at 5.85 but free T4 within normal range.  EKG with atrial fibrillation with T wave inversion.  2D echocardiogram has been ordered. Cardiology has been consulted.  We will follow cardiology  recommendations.  Hyperbilirubinemia/Elevated liver enzymes Likely secondary to ongoing alcohol usage.  Right upper quadrant ultrasound showing cirrhotic liver with ascites.  AKI with Elevated CK Creatinine levels were elevated at 1.6 compared to baseline 0.9.  Continue IV fluids.  Check BMP in AM.   Hyperkalemia Received 1 dose of Lokelma.  Potassium low at 3.0 today.  Will give p.o. potassium x1.  Essential hypertension Was elevated on presentation.  Likely secondary to noncompliance.  On amlodipine and metoprolol  at home.  Will resume amlodipine and as needed hydralazine for now due to prolonged QTC on presentation but.   Prolonged QT QTC of 585.  On presentation.  Hold beta-blocker.  Monitor electrolytes closely.  Check EKG in AM.    Anemia-normocytic Iron panel with low iron at 18 and saturation was low as well.  Vitamin B12 elevated.  Folic acid within normal range.   Alcohol use Continue thiamine, multivitamin and folic acid.  On CIWA protocol.   Chronic systolic HF  -compensated at this time.  Check 2D echocardiogram.   Atrial fibrillation Has declined anticoagulation in the past.   DVT prophylaxis: enoxaparin (LOVENOX) injection 40 mg Start: 12/22/20 2215  Code Status: Full code  Family Communication:  Spoke with the patient at bedside.  Status is: Inpatient  Remains inpatient appropriate because: Of generalized weakness, possible need for placement, IV fluid hydration, pericardial effusion, acute kidney injury  Consultants: Cardiology  Procedures: None  Anti-infectives:  None  Anti-infectives (From admission, onward)    None      Subjective: Today, patient was seen and examined at bedside.  Patient is a very  poor historian.  Patient denies any nausea vomiting chest pain or palpitation.  He does not really know why he is here  Objective: Vitals:   12/23/20 1000 12/23/20 1030  BP: (!) 130/103 (!) 137/98  Pulse: 89 74  Resp: (!) 22 20  Temp:     SpO2: 98% 100%    Intake/Output Summary (Last 24 hours) at 12/23/2020 1126 Last data filed at 12/22/2020 1940 Gross per 24 hour  Intake 1000 ml  Output --  Net 1000 ml   Filed Weights   12/22/20 1807  Weight: 69 kg   Body mass index is 20.07 kg/m.   Physical Exam: GENERAL: Patient is alert awake and medicated.  Oriented to place, not in obvious distress.  Thinly built. HENT: No scleral pallor or icterus. Pupils equally reactive to light. Oral mucosa is mildly dry. NECK: is supple, no gross swelling noted. CHEST: Clear to auscultation. No crackles or wheezes.  Diminished breath sounds bilaterally. CVS: S1 and S2 heard, no murmur. Regular rate and rhythm.  ABDOMEN: Soft, non-tender, bowel sounds are present. EXTREMITIES: No edema.  Muscle wasting noted. CNS: Cranial nerves are intact. No focal motor deficits. SKIN: warm and dry,   Thick scaly skin.  Data Review: I have personally reviewed the following laboratory data and studies,  CBC: Recent Labs  Lab 12/22/20 2009  WBC 5.5  NEUTROABS 5.0  HGB 10.4*  HCT 32.2*  MCV 86.1  PLT 185   Basic Metabolic Panel: Recent Labs  Lab 12/22/20 2009 12/23/20 0412  NA 141 144  K 5.7* 3.0*  CL 108 109  CO2 23 22  GLUCOSE 67* 62*  BUN 32* 33*  CREATININE 1.63* 1.57*  CALCIUM 9.0 8.7*   Liver Function Tests: Recent Labs  Lab 12/22/20 2009  AST 93*  ALT 42  ALKPHOS 118  BILITOT 4.4*  PROT 7.6  ALBUMIN 2.5*   No results for input(s): LIPASE, AMYLASE in the last 168 hours. No results for input(s): AMMONIA in the last 168 hours. Cardiac Enzymes: Recent Labs  Lab 12/22/20 2009  CKTOTAL 919*   BNP (last 3 results) Recent Labs    07/04/20 1032  BNP 558.9*    ProBNP (last 3 results) No results for input(s): PROBNP in the last 8760 hours.  CBG: No results for input(s): GLUCAP in the last 168 hours. Recent Results (from the past 240 hour(s))  Resp Panel by RT-PCR (Flu A&B, Covid) Nasopharyngeal Swab      Status: None   Collection Time: 12/22/20 10:17 PM   Specimen: Nasopharyngeal Swab; Nasopharyngeal(NP) swabs in vial transport medium  Result Value Ref Range Status   SARS Coronavirus 2 by RT PCR NEGATIVE NEGATIVE Final    Comment: (NOTE) SARS-CoV-2 target nucleic acids are NOT DETECTED.  The SARS-CoV-2 RNA is generally detectable in upper respiratory specimens during the acute phase of infection. The lowest concentration of SARS-CoV-2 viral copies this assay can detect is 138 copies/mL. A negative result does not preclude SARS-Cov-2 infection and should not be used as the sole basis for treatment or other patient management decisions. A negative result may occur with  improper specimen collection/handling, submission of specimen other than nasopharyngeal swab, presence of viral mutation(s) within the areas targeted by this assay, and inadequate number of viral copies(<138 copies/mL). A negative result must be combined with clinical observations, patient history, and epidemiological information. The expected result is Negative.  Fact Sheet for Patients:  EntrepreneurPulse.com.au  Fact Sheet for Healthcare Providers:  IncredibleEmployment.be  This test is  no t yet approved or cleared by the [place] and  has been authorized for detection and/or diagnosis of SARS-CoV-2 by FDA under an Emergency Use Authorization (EUA). This EUA will remain  in effect (meaning this test can be used) for the duration of the COVID-19 declaration under Section 564(b)(1) of the Act, 21 U.S.C.section 360bbb-3(b)(1), unless the authorization is terminated  or revoked sooner.       Influenza A by PCR NEGATIVE NEGATIVE Final   Influenza B by PCR NEGATIVE NEGATIVE Final    Comment: (NOTE) The Xpert Xpress SARS-CoV-2/FLU/RSV plus assay is intended as an aid in the diagnosis of influenza from Nasopharyngeal swab specimens and should not be used as a sole basis  for treatment. Nasal washings and aspirates are unacceptable for Xpert Xpress SARS-CoV-2/FLU/RSV testing.  Fact Sheet for Patients: EntrepreneurPulse.com.au  Fact Sheet for Healthcare Providers: IncredibleEmployment.be  This test is not yet approved or cleared by the [place] FDA and has been authorized for detection and/or diagnosis of SARS-CoV-2 by FDA under an Emergency Use Authorization (EUA). This EUA will remain in effect (meaning this test can be used) for the duration of the COVID-19 declaration under Section 564(b)(1) of the Act, 21 U.S.C. section 360bbb-3(b)(1), unless the authorization is terminated or revoked.  Performed at [facility], [place], [place] [address], [place], [place] [address]      Studies: DG Chest Portable 1 View  Result Date: 12/22/2020 CLINICAL DATA:  Evaluate for pneumonia. EXAM: PORTABLE CHEST 1 VIEW COMPARISON:  Chest x-ray 10/07/2014. FINDINGS: Cardiac silhouette is markedly enlarged and has increased in size. The lungs are clear. There is no pleural effusion or pneumothorax. No acute fractures are seen. IMPRESSION: 1. Marked enlargement of the cardiac silhouette. This has increased in size. Findings may related to pericardial effusion and/or worsening cardiomegaly. Electronically Signed   By: Ronney Asters M.D.   On: 12/22/2020 19:02   US Abdomen Limited RUQ (LIVER/GB)  Result Date: 12/22/2020 CLINICAL DATA:  Elevated bilirubin EXAM: ULTRASOUND ABDOMEN LIMITED RIGHT UPPER QUADRANT COMPARISON:  CT 03/30/2014 FINDINGS: Gallbladder: No gallstones or wall thickening visualized. No sonographic Murphy sign noted by sonographer. Common bile duct: Diameter: 3 mm Liver: Nodular liver contour suspicious for cirrhosis. No focal hepatic mass lesion is seen. There is antegrade flow in the main portal vein. Other: Multiple cysts in the right kidney. 5.8 by 5.7 x 4.8 cm mildly complicated right renal cyst contains a  small amount of internal debris. IMPRESSION: 1. Cirrhotic morphology of the liver with ascites in the right upper quadrant 2. Negative for gallstones or biliary dilatation 3. Multiple right renal cysts with slightly complex exophytic cyst off the lower pole of right kidney. Dedicated renal sonogram may be obtained for more complete assessment. Electronically Signed   By: Donavan Foil M.D.   On: 12/22/2020 22:58      Flora Lipps, MD  [facility] 12/23/2020  If 7PM-7AM, please contact night-coverage

## 2020-12-23 NOTE — Progress Notes (Signed)
  Echocardiogram 2D Echocardiogram has been performed.  Tommy Morris 12/23/2020, 12:08 PM

## 2020-12-24 DIAGNOSIS — N179 Acute kidney failure, unspecified: Secondary | ICD-10-CM | POA: Diagnosis not present

## 2020-12-24 DIAGNOSIS — I3139 Other pericardial effusion (noninflammatory): Secondary | ICD-10-CM | POA: Diagnosis not present

## 2020-12-24 DIAGNOSIS — F1010 Alcohol abuse, uncomplicated: Secondary | ICD-10-CM | POA: Diagnosis not present

## 2020-12-24 DIAGNOSIS — D649 Anemia, unspecified: Secondary | ICD-10-CM | POA: Diagnosis not present

## 2020-12-24 LAB — CBC
HCT: 31.2 % — ABNORMAL LOW (ref 39.0–52.0)
Hemoglobin: 10.4 g/dL — ABNORMAL LOW (ref 13.0–17.0)
MCH: 27.9 pg (ref 26.0–34.0)
MCHC: 33.3 g/dL (ref 30.0–36.0)
MCV: 83.6 fL (ref 80.0–100.0)
Platelets: 149 10*3/uL — ABNORMAL LOW (ref 150–400)
RBC: 3.73 MIL/uL — ABNORMAL LOW (ref 4.22–5.81)
RDW: 20.3 % — ABNORMAL HIGH (ref 11.5–15.5)
WBC: 5.5 10*3/uL (ref 4.0–10.5)
nRBC: 0.7 % — ABNORMAL HIGH (ref 0.0–0.2)

## 2020-12-24 LAB — BASIC METABOLIC PANEL
Anion gap: 7 (ref 5–15)
BUN: 34 mg/dL — ABNORMAL HIGH (ref 8–23)
CO2: 26 mmol/L (ref 22–32)
Calcium: 8.5 mg/dL — ABNORMAL LOW (ref 8.9–10.3)
Chloride: 109 mmol/L (ref 98–111)
Creatinine, Ser: 1.34 mg/dL — ABNORMAL HIGH (ref 0.61–1.24)
GFR, Estimated: 53 mL/min — ABNORMAL LOW (ref >60)
Glucose, Bld: 94 mg/dL (ref 70–99)
Potassium: 3 mmol/L — ABNORMAL LOW (ref 3.5–5.1)
Sodium: 142 mmol/L (ref 135–145)

## 2020-12-24 LAB — PHOSPHORUS: Phosphorus: 3.3 mg/dL (ref 2.5–4.6)

## 2020-12-24 LAB — MAGNESIUM: Magnesium: 1.8 mg/dL (ref 1.7–2.4)

## 2020-12-24 MED ORDER — POTASSIUM CHLORIDE CRYS ER 20 MEQ PO TBCR
40.0000 meq | EXTENDED_RELEASE_TABLET | Freq: Once | ORAL | Status: AC
  Administered 2020-12-24: 40 meq via ORAL
  Filled 2020-12-24: qty 2

## 2020-12-24 MED ORDER — CARVEDILOL 6.25 MG PO TABS
6.2500 mg | ORAL_TABLET | Freq: Two times a day (BID) | ORAL | Status: DC
  Administered 2020-12-24 – 2020-12-27 (×6): 6.25 mg via ORAL
  Filled 2020-12-24 (×8): qty 1

## 2020-12-24 MED ORDER — FUROSEMIDE 10 MG/ML IJ SOLN
40.0000 mg | Freq: Once | INTRAMUSCULAR | Status: AC
  Administered 2020-12-24: 40 mg via INTRAVENOUS
  Filled 2020-12-24: qty 4

## 2020-12-24 NOTE — Progress Notes (Signed)
Pt had 13 beats Vtach. BP 118/92, HR 88, Resp 16. No complaints of chest pain. MD notified. Will continue to monitor.

## 2020-12-24 NOTE — Evaluation (Signed)
Physical Therapy Evaluation Patient Details Name: Tommy Morris MRN: [id number] DOB: 08/07/1940 Today's Date: 12/24/2020  History of Present Illness  Tommy Morris is a 80 y.o. male admit to the ED with family c/o increased wekaness and difficulty ambulating and decreased oral intake. Patient lives by himself at home and family had checked on him.  For the last 3 weeks patient has been complaining of dizziness and difficulty with ambulation with decreased oral intake.  Patient does have history of alcohol consumption.  In the ED, patient was noted to have temperature of 99.2 F.  Patient did not have any leukocytosis.  Potassium was slightly elevated at 5.7.  Creatinine 1.6 from prior 0.9.  AST was elevated at 93 ALT of 42.  Total bilirubin of 4.4.  CK level was 919.  TSH of 5.8.  Chest x-ray showed a concerning for pericardial effusion.  Troponin was elevated at 40.  Bedside ultrasound was obtained in the ED which showed pericardial effusion.  Cardiology was consulted   PMH: history of bladder cancer s/p transurethral resection in 3790, chronic systolic heart failure, atrial fibrillation nonintact with radiation, and hypertension  Clinical Impression  Pt unaware of why he is here, states  " what do you want me to do, I am fine. I can walk" however when asked to ambulate with me, he stated, " I cannot get up I don't want to fall". So awareness of deficits and understanding not clear. Poor historian and family or friend was in the room but would not wake for any of our conversation or assessment, so unclear of home situation, PLOF or if he has assistance.  He sates he typically wants around not using any AD , but seems to not been moving around lately (?) . Pt appears weak, some knee flexion contractures, but able to ambulate in hallway with RW today . Recommend RW for home if he will use it and agrees, same with HHPT , but unaware if he will think he needs it and allow then to come into the home. Will  continue to follow while in acute care.      Recommendations for follow up therapy are one component of a multi-disciplinary discharge planning process, led by the attending physician.  Recommendations may be updated based on patient status, additional functional criteria and insurance authorization.  Follow Up Recommendations Home health PT (no sure pt will agree to HHPT or understand the service)    Assistance Recommended at Discharge Frequent or constant Supervision/Assistance  Functional Status Assessment Patient has had a recent decline in their functional status and demonstrates the ability to make significant improvements in function in a reasonable and predictable amount of time.  Equipment Recommendations  Rolling walker (2 wheels)    Recommendations for Other Services       Precautions / Restrictions Precautions Precautions: Fall      Mobility  Bed Mobility Overal bed mobility: Modified Independent                  Transfers Overall transfer level: Needs assistance Equipment used: Rolling walker (2 wheels) Transfers: Sit to/from Stand Sit to Stand: Min guard           General transfer comment: a little to stedy initially    Ambulation/Gait Ambulation/Gait assistance: Min guard Gait Distance (Feet): 100 Feet Assistive device: Rolling walker (2 wheels) Gait Pattern/deviations: Step-through pattern       General Gait Details: slightly flexed pattern , narrow base of support  Stairs            Wheelchair Mobility    Modified Rankin (Stroke Patients Only)       Balance Overall balance assessment: Needs assistance Sitting-balance support: No upper extremity supported;Feet supported Sitting balance-Leahy Scale: Good     Standing balance support: Bilateral upper extremity supported;During functional activity;Reliant on assistive device for balance Standing balance-Leahy Scale: Fair                               Pertinent  Vitals/Pain Pain Assessment: No/denies pain    Home Living Family/patient expects to be discharged to:: Private residence Living Arrangements: Alone   Type of Home: House Home Access: Stairs to enter Entrance Stairs-Rails:  (unsure) Entrance Stairs-Number of Steps: unsure , pt states he has "some"            Prior Function Prior Level of Function : Patient poor historian/Family not available             Mobility Comments: unsure , chart states he lives alone and was able to walk around . pt very poor historian, and difficult to understand.       Hand Dominance        Extremity/Trunk Assessment        Lower Extremity Assessment Lower Extremity Assessment: RLE deficits/detail;LLE deficits/detail RLE Deficits / Details: grossly knee flexion contracture of about 20 degree, tight hip abdcution as well. Very "stiff " during functional movement, grossly 4/5 LE strength LLE Deficits / Details: grossly knee flexion contracture of about 20 degree, tight hip abdcution as well. Very "stiff " during functional movement, grossly 4/5 LE strength    Cervical / Trunk Assessment Cervical / Trunk Assessment: Kyphotic  Communication      Cognition Arousal/Alertness: Awake/alert Behavior During Therapy: WFL for tasks assessed/performed Overall Cognitive Status: Difficult to assess                                 General Comments: unclear of his deficits , states he doesn't  have any issues , doesn't know why he is here, yet he states he doesn't want to get up because he doesn't want to fall.        General Comments      Exercises     Assessment/Plan    PT Assessment Patient needs continued PT services  PT Problem List Decreased strength;Decreased activity tolerance;Decreased mobility;Decreased balance       PT Treatment Interventions Gait training;Functional mobility training;Therapeutic activities;Therapeutic exercise    PT Goals (Current goals can be  found in the Care Plan section)  Acute Rehab PT Goals Patient Stated Goal: When am I going to go home PT Goal Formulation: With patient Time For Goal Achievement: 01/07/21 Potential to Achieve Goals: Good    Frequency Min 3X/week   Barriers to discharge        Co-evaluation               AM-PAC PT "6 Clicks" Mobility  Outcome Measure Help needed turning from your back to your side while in a flat bed without using bedrails?: None Help needed moving from lying on your back to sitting on the side of a flat bed without using bedrails?: None Help needed moving to and from a bed to a chair (including a wheelchair)?: A Little Help needed standing up from a chair using  your arms (e.g., wheelchair or bedside chair)?: A Little Help needed to walk in hospital room?: A Little Help needed climbing 3-5 steps with a railing? : A Little 6 Click Score: 20    End of Session Equipment Utilized During Treatment: Gait belt Activity Tolerance: Patient tolerated treatment well Patient left: in bed;with bed alarm set Nurse Communication: Mobility status PT Visit Diagnosis: Other abnormalities of gait and mobility (R26.89)    Time: 1430-1455 PT Time Calculation (min) (ACUTE ONLY): 25 min   Charges:   PT Evaluation $PT Eval Low Complexity: 1 Low PT Treatments $Gait Training: 8-22 mins        Colisha Redler, PT, MPT Acute Rehabilitation Services Office: [phone] Pager: [phone] 12/24/2020   Clide Dales 12/24/2020, 3:27 PM

## 2020-12-24 NOTE — Progress Notes (Signed)
PROGRESS NOTE  LAWRANCE WIEDEMANN OAC:[id number] DOB: 1940/09/19 DOA: 12/22/2020 PCP: Axel Filler, MD   LOS: 2 days   Brief narrative: Tommy Morris is a 80 y.o. male with medical history significant for remote history of bladder cancer s/p transurethral resection in 0109, chronic systolic heart failure, atrial fibrillation nonintact with radiation, and hypertension presented to hospital with generalized weakness.  Patient lives by himself at home and family had checked on him.  For the last 3 weeks patient has been complaining of dizziness and difficulty with ambulation with decreased oral intake.  Patient does have history of alcohol consumption.  In the ED, patient was noted to have temperature of 99.2 F.  Patient did not have any leukocytosis.  Potassium was slightly elevated at 5.7.  Creatinine 1.6 from prior 0.9.  AST was elevated at 93 ALT of 42.  Total bilirubin of 4.4.  CK level was 919.  TSH of 5.8.  Chest x-ray showed a concerning for pericardial effusion.  Troponin was elevated at 40.  Bedside ultrasound was obtained in the ED which showed pericardial effusion.  Cardiology was consulted and patient was admitted to hospital with further evaluation and treatment.  Assessment/Plan:  Principal Problem:   Pericardial effusion Active Problems:   Anemia   Alcohol use disorder   Essential hypertension   Atrial fibrillation (HCC)   Chronic systolic heart failure (HCC)   Elevated bilirubin   AKI (acute kidney injury) ([place])  Generalized weakness, failure to thrive.  Dietary has been consulted.  PT evaluation has been ordered as well.    Pericardial effusion  Unclear etiology.  No evidence of hemodynamic instability.  Flu and COVID was negative. TSH elevated at 5.85 but free T4 within normal range.  EKG with atrial fibrillation with T wave inversion.  2D echocardiogram shows reduced LV function at 35 to 40% with global left ventricular hypokinesis and diastolic dysfunction. Cardiology  has been consulted.  We will follow cardiology recommendations.  Hyperbilirubinemia/Elevated liver enzymes Likely secondary to ongoing alcohol usage.  Right upper quadrant ultrasound showing cirrhotic liver with ascites.  Check LFTs in AM.  AKI with Elevated CK Creatinine levels were elevated at 1.6 compared to baseline 0.9.  Continue IV fluids.  Creatinine has trended down to 1.3 today.  We will continue to monitor closely.   Hyperkalemia followed by hypokalemia Received 1 dose of Lokelma.  Potassium low at 3.0 today.  Continue to replenish potassium.  Check levels in a.m.  Essential hypertension  On amlodipine and metoprolol  at home.  Metoprolol on hold due to QTc presentation.  Continue amlodipine and hydralazine.   Prolonged QT QTC of 585.  On presentation.  Replenish lites.  Consider beta-blocker in AM.  Anemia-normocytic Iron panel with low iron at 18 and saturation was low as well.  Vitamin B12 elevated.  Folic acid within normal range.   Alcohol use Continue thiamine, multivitamin and folic acid.  On CIWA protocol.  No active withdrawal signs noted.   Chronic systolic HF  -compensated at this time.  With pericardial effusion cardiology will be consulted.   Atrial fibrillation Has declined anticoagulation in the past.  Metoprolol on hold.  Heart rate is controlled.  DVT prophylaxis: enoxaparin (LOVENOX) injection 40 mg Start: 12/22/20 2215  Code Status: Full code  Family Communication:  [place] with the patient's daughter on the phone and updated her about the clinical condition of the patient.  Status is: Inpatient  Remains inpatient appropriate because: generalized weakness, possible need for  placement,  pericardial effusion, acute kidney injury, hypokalemia,  Consultants: Cardiology  Procedures: None  Anti-infectives:  None  Anti-infectives (From admission, onward)    None      Subjective: Today, patient was seen and examined at bedside.  Patient is a  very poor historian.  Patient states that he is okay.  Denies any pain, nausea, vomiting, fever or chills.  Denies any shortness of breath   Objective: Vitals:   12/24/20 0623 12/24/20 1144  BP:  (!) 123/92  Pulse:  71  Resp: 13 (!) 22  Temp:  97.7 F (36.5 C)  SpO2:  92%    Intake/Output Summary (Last 24 hours) at 12/24/2020 1323 Last data filed at 12/24/2020 9562 Gross per 24 hour  Intake 820.72 ml  Output 200 ml  Net 620.72 ml    Filed Weights   12/22/20 1807  Weight: 69 kg   Body mass index is 20.07 kg/m.   Physical Exam: GENERAL: Patient is alert awake and communicative, oriented to place and person, not in obvious distress.  Thinly built. HENT: No scleral pallor or icterus. Pupils equally reactive to light. Oral mucosa is mildly dry.  Cloudy left cornea. NECK: is supple, no gross swelling noted. CHEST:.  Diminished breath sounds bilaterally. CVS: S1 and S2 heard, ABDOMEN: Soft, non-tender, bowel sounds are present. EXTREMITIES: No edema.  Muscle wasting noted. CNS: Cranial nerves are intact. No focal motor deficits. SKIN: warm and dry,   Thick scaly skin.  Data Review: I have personally reviewed the following laboratory data and studies,  CBC: Recent Labs  Lab 12/22/20 2009 12/24/20 0430  WBC 5.5 5.5  NEUTROABS 5.0  --   HGB 10.4* 10.4*  HCT 32.2* 31.2*  MCV 86.1 83.6  PLT 180 149*    Basic Metabolic Panel: Recent Labs  Lab 12/22/20 2009 12/23/20 0412 12/24/20 0430  NA 141 144 142  K 5.7* 3.0* 3.0*  CL 108 109 109  CO2 23 22 26   GLUCOSE 67* 62* 94  BUN 32* 33* 34*  CREATININE 1.63* 1.57* 1.34*  CALCIUM 9.0 8.7* 8.5*  MG  --   --  1.8  PHOS  --   --  3.3    Liver Function Tests: Recent Labs  Lab 12/22/20 2009  AST 93*  ALT 42  ALKPHOS 118  BILITOT 4.4*  PROT 7.6  ALBUMIN 2.5*    No results for input(s): LIPASE, AMYLASE in the last 168 hours. No results for input(s): AMMONIA in the last 168 hours. Cardiac Enzymes: Recent Labs   Lab 12/22/20 2009  CKTOTAL 919*    BNP (last 3 results) Recent Labs    07/04/20 1032  BNP 558.9*     ProBNP (last 3 results) No results for input(s): PROBNP in the last 8760 hours.  CBG: No results for input(s): GLUCAP in the last 168 hours. Recent Results (from the past 240 hour(s))  Resp Panel by RT-PCR (Flu A&B, Covid) Nasopharyngeal Swab     Status: None   Collection Time: 12/22/20 10:17 PM   Specimen: Nasopharyngeal Swab; Nasopharyngeal(NP) swabs in vial transport medium  Result Value Ref Range Status   SARS Coronavirus 2 by RT PCR NEGATIVE NEGATIVE Final    Comment: (NOTE) SARS-CoV-2 target nucleic acids are NOT DETECTED.  The SARS-CoV-2 RNA is generally detectable in upper respiratory specimens during the acute phase of infection. The lowest concentration of SARS-CoV-2 viral copies this assay can detect is 138 copies/mL. A negative result does not preclude SARS-Cov-2 infection and should  not be used as the sole basis for treatment or other patient management decisions. A negative result may occur with  improper specimen collection/handling, submission of specimen other than nasopharyngeal swab, presence of viral mutation(s) within the areas targeted by this assay, and inadequate number of viral copies(<138 copies/mL). A negative result must be combined with clinical observations, patient history, and epidemiological information. The expected result is Negative.  Fact Sheet for Patients:  EntrepreneurPulse.com.au  Fact Sheet for Healthcare Providers:  IncredibleEmployment.be  This test is no t yet approved or cleared by the [place] FDA and  has been authorized for detection and/or diagnosis of SARS-CoV-2 by FDA under an Emergency Use Authorization (EUA). This EUA will remain  in effect (meaning this test can be used) for the duration of the COVID-19 declaration under Section 564(b)(1) of the Act, 21 U.S.C.section  360bbb-3(b)(1), unless the authorization is terminated  or revoked sooner.       Influenza A by PCR NEGATIVE NEGATIVE Final   Influenza B by PCR NEGATIVE NEGATIVE Final    Comment: (NOTE) The Xpert Xpress SARS-CoV-2/FLU/RSV plus assay is intended as an aid in the diagnosis of influenza from Nasopharyngeal swab specimens and should not be used as a sole basis for treatment. Nasal washings and aspirates are unacceptable for Xpert Xpress SARS-CoV-2/FLU/RSV testing.  Fact Sheet for Patients: EntrepreneurPulse.com.au  Fact Sheet for Healthcare Providers: IncredibleEmployment.be  This test is not yet approved or cleared by the [place] FDA and has been authorized for detection and/or diagnosis of SARS-CoV-2 by FDA under an Emergency Use Authorization (EUA). This EUA will remain in effect (meaning this test can be used) for the duration of the COVID-19 declaration under Section 564(b)(1) of the Act, 21 U.S.C. section 360bbb-3(b)(1), unless the authorization is terminated or revoked.  Performed at [facility], [place] [address], [place], [place] [address]       Studies: DG Chest Portable 1 View  Result Date: 12/22/2020 CLINICAL DATA:  Evaluate for pneumonia. EXAM: PORTABLE CHEST 1 VIEW COMPARISON:  Chest x-ray 10/07/2014. FINDINGS: Cardiac silhouette is markedly enlarged and has increased in size. The lungs are clear. There is no pleural effusion or pneumothorax. No acute fractures are seen. IMPRESSION: 1. Marked enlargement of the cardiac silhouette. This has increased in size. Findings may related to pericardial effusion and/or worsening cardiomegaly. Electronically Signed   By: Ronney Asters M.D.   On: 12/22/2020 19:02   ECHOCARDIOGRAM COMPLETE  Result Date: 12/23/2020    ECHOCARDIOGRAM REPORT   Patient Name:   CLIFORD SEQUEIRA Date of Exam: 12/23/2020 Medical Rec #:  [id number]     Height:       73.0 in Accession #:     [id number]    Weight:       152.1 lb Date of Birth:  Jan 30, 1940      BSA:          1.915 m Patient Age:    49 years      BP:           130/103 mmHg Patient Gender: M             HR:           88 bpm. Exam Location:  Inpatient Procedure: 2D Echo, 3D Echo, Color Doppler and Cardiac Doppler Indications:    I31.3 Pericardial effusion (noninflammatory)  History:        Patient has prior history of Echocardiogram examinations, most  recent 05/03/2009. CHF, Abnormal ECG, Mitral Valve Disease;                 Arrythmias:Atrial Fibrillation. ETOH. Cancer. Mild MVP.  Sonographer:    Roseanna Rainbow RDCS Referring Phys: [id number] [place]  1. Left ventricular ejection fraction, by estimation, is 35 to 40%. The left ventricle has moderately decreased function. The left ventricle demonstrates global hypokinesis. There is moderate left ventricular hypertrophy. Left ventricular diastolic parameters are indeterminate.  2. Right ventricular systolic function is moderately reduced. The right ventricular size is severely enlarged. There is mildly elevated pulmonary artery systolic pressure. The estimated right ventricular systolic pressure is 17.0 mmHg.  3. Left atrial size was severely dilated.  4. Right atrial size was severely dilated.  5. A small to moderte pericardial effusion is present, measures 32mm adjacent to LV inferior wall. There is no evidence of cardiac tamponade.  6. The mitral valve is abnormal. Moderate to severe mitral valve regurgitation.  7. The tricuspid valve is abnormal. Tricuspid valve regurgitation is severe.  8. The aortic valve is tricuspid. Aortic valve regurgitation is mild. Aortic valve sclerosis/calcification is present, without any evidence of aortic stenosis.  9. Aortic dilatation noted. There is dilatation of the ascending aorta, measuring 39 mm. 10. The inferior vena cava is dilated in size with <50% respiratory variability, suggesting right atrial pressure of 15 mmHg. FINDINGS   Left Ventricle: Left ventricular ejection fraction, by estimation, is 35 to 40%. The left ventricle has moderately decreased function. The left ventricle demonstrates global hypokinesis. The left ventricular internal cavity size was normal in size. There is moderate left ventricular hypertrophy. Left ventricular diastolic parameters are indeterminate. Right Ventricle: The right ventricular size is severely enlarged. No increase in right ventricular wall thickness. Right ventricular systolic function is moderately reduced. There is mildly elevated pulmonary artery systolic pressure. The tricuspid regurgitant velocity is 2.70 m/s, and with an assumed right atrial pressure of 15 mmHg, the estimated right ventricular systolic pressure is 01.7 mmHg. Left Atrium: Left atrial size was severely dilated. Right Atrium: Right atrial size was severely dilated. Pericardium: A small pericardial effusion is present. There is no evidence of cardiac tamponade. Mitral Valve: The mitral valve is abnormal. Moderate to severe mitral valve regurgitation. Tricuspid Valve: The tricuspid valve is abnormal. Tricuspid valve regurgitation is severe. Aortic Valve: The aortic valve is tricuspid. Aortic valve regurgitation is mild. Aortic regurgitation PHT measures 721 msec. Aortic valve sclerosis/calcification is present, without any evidence of aortic stenosis. Pulmonic Valve: The pulmonic valve was grossly normal. Pulmonic valve regurgitation is mild. Aorta: The aortic root is normal in size and structure and aortic dilatation noted. There is dilatation of the ascending aorta, measuring 39 mm. Venous: The inferior vena cava is dilated in size with less than 50% respiratory variability, suggesting right atrial pressure of 15 mmHg. IAS/Shunts: The interatrial septum was not well visualized.  LEFT VENTRICLE PLAX 2D LVIDd:         5.20 cm LVIDs:         4.70 cm LV PW:         1.30 cm LV IVS:        1.30 cm LVOT diam:     2.30 cm      3D Volume EF:  LV SV:         40           3D EF:        39 % LV SV Index:  21           LV EDV:       222 ml LVOT Area:     4.15 cm     LV ESV:       135 ml                             LV SV:        87 ml  LV Volumes (MOD) LV vol d, MOD A2C: 167.0 ml LV vol d, MOD A4C: 171.0 ml LV vol s, MOD A2C: 121.0 ml LV vol s, MOD A4C: 100.0 ml LV SV MOD A2C:     46.0 ml LV SV MOD A4C:     171.0 ml LV SV MOD BP:      62.7 ml RIGHT VENTRICLE            IVC RV S prime:     3.87 cm/s  IVC diam: 3.70 cm TAPSE (M-mode): 1.2 cm LEFT ATRIUM              Index        RIGHT ATRIUM           Index LA diam:        5.40 cm  2.82 cm/m   RA Area:     55.70 cm LA Vol (A2C):   191.0 ml 99.72 ml/m  RA Volume:   283.00 ml 147.75 ml/m LA Vol (A4C):   109.0 ml 56.91 ml/m LA Biplane Vol: 159.0 ml 83.01 ml/m  AORTIC VALVE             PULMONIC VALVE LVOT Vmax:   82.20 cm/s  PR End Diast Vel: 2.43 msec LVOT Vmean:  47.550 cm/s LVOT VTI:    0.096 m AI PHT:      721 msec  AORTA Ao Root diam: 3.80 cm Ao Asc diam:  3.90 cm MITRAL VALVE                  TRICUSPID VALVE MV Area (PHT): 5.82 cm       TR Peak grad:   29.2 mmHg MV Decel Time: 130 msec       TR Vmax:        270.00 cm/s MR Peak grad:    116.2 mmHg MR Mean grad:    62.5 mmHg    SHUNTS MR Vmax:         539.00 cm/s  Systemic VTI:  0.10 m MR Vmean:        357.5 cm/s   Systemic Diam: 2.30 cm MR PISA:         2.26 cm MR PISA Eff ROA: 16 mm MR PISA Radius:  0.60 cm MV E velocity: 79.55 cm/s Oswaldo Milian MD Electronically signed by Oswaldo Milian MD Signature Date/Time: 12/23/2020/2:23:00 PM    Final    US Abdomen Limited RUQ (LIVER/GB)  Result Date: 12/22/2020 CLINICAL DATA:  Elevated bilirubin EXAM: ULTRASOUND ABDOMEN LIMITED RIGHT UPPER QUADRANT COMPARISON:  CT 03/30/2014 FINDINGS: Gallbladder: No gallstones or wall thickening visualized. No sonographic Murphy sign noted by sonographer. Common bile duct: Diameter: 3 mm Liver: Nodular liver contour suspicious for cirrhosis. No focal  hepatic mass lesion is seen. There is antegrade flow in the main portal vein. Other: Multiple cysts in the right kidney. 5.8 by 5.7 x 4.8 cm mildly complicated right renal cyst contains a small amount of internal debris. IMPRESSION: 1. Cirrhotic morphology of  the liver with ascites in the right upper quadrant 2. Negative for gallstones or biliary dilatation 3. Multiple right renal cysts with slightly complex exophytic cyst off the lower pole of right kidney. Dedicated renal sonogram may be obtained for more complete assessment. Electronically Signed   By: Donavan Foil M.D.   On: 12/22/2020 22:58      Flora Lipps, MD  [facility] 12/24/2020  If 7PM-7AM, please contact night-coverage

## 2020-12-24 NOTE — Consult Note (Signed)
Cardiology Consultation:   Patient ID: GENERAL WEARING MRN: [id number]; DOB: 1940/09/18  Admit date: 12/22/2020 Date of Consult: 12/24/2020  PCP:  Axel Filler, MD   [facility] Providers Cardiologist:  }     Patient Profile:   Tommy Morris is a 80 y.o. male with a hx of  who is being seen 12/24/2020 for the evaluation of atrial fibrillation at the request of Dr. Louanne Belton.  History of Present Illness:   Mr. Franek is an 80 year old with a history of chronic systolic CHF, atrial fibrillation and hypertension.  He was admitted yesterday because of weakness.  The patient told hospitalist he complained of 3 weeks of dizziness, problems with ambulation.  Yesterday he was unable to get up.  He has not been taking his blood pressure medication. In the emergency room temperature 99.2.  Troponin 40.  Bedside echo showed a pericardial effusion.  EKG showed atrial fibrillation.  The pt says he feels OK   Wants to go home    Says he can take care of himself   he denies CP   no palpitations   No SOB    Note pt had bed bugs on him on admit   Past Medical History:  Diagnosis Date   Atrial fibrillation ([place]) chronic    echo 05/03/2009 mildly to moderately reduced LV systolic function ef 29-52%  Patient declines anticoagulation for stroke prophylaxis.   BPH (benign prostatic hypertrophy) with urinary obstruction    Diverticulosis of colon    History of bladder cancer    2006  S/P TURBT  UROTHELIAL CARCINOMA   History of gout    History of urinary retention    LAST ISSUE 11/2012 W/ ARF   Hypertension    Mild mitral valve prolapse    Mitral valve regurgitation    OA (osteoarthritis) of knee    Pulmonary nodule    Two small right lung nodules on chest CT 04/25/2009    Past Surgical History:  Procedure Laterality Date   CYSTOSCOPY WITH RETROGRADE PYELOGRAM, URETEROSCOPY AND STENT PLACEMENT Bilateral 01/06/2013   Procedure: CYSTOSCOPY WITH BILATERAL RETROGRADE PYELOGRAM, ;  Surgeon:  Irine Seal, MD;  Location: [place] [facility];  Service: Urology;  Laterality: Bilateral;   TONSILLECTOMY  AGE 5   TRANSTHORACIC ECHOCARDIOGRAM  05-03-2009   DIFFUSE HYPOKINESIS/ EF 40-45%/  MILD MVP WITH MILD TO MODERATE MR/  MILDLY DILATED LA & RA   TRANSURETHRAL RESECTION OF BLADDER TUMOR  10-26-2004   PLACEMENT RIGHT URETERAL STENT AND TRANSRECTAL PROSTATE BX   TRANSURETHRAL RESECTION OF BLADDER TUMOR WITH GYRUS (TURBT-GYRUS) N/A 01/06/2013   Procedure: TRANSURETHRAL RESECTION OF BLADDER TUMOR WITH GYRUS (TURBT-GYRUS);  Surgeon: Irine Seal, MD;  Location: [facility];  Service: Urology;  Laterality: N/A;   TRANSURETHRAL RESECTION OF PROSTATE N/A 01/06/2013   Procedure: TRANSURETHRAL RESECTION OF THE PROSTATE (TURP);  Surgeon: Irine Seal, MD;  Location: [facility];  Service: Urology;  Laterality: N/A;       Inpatient Medications: Scheduled Meds:  amLODipine  5 mg Oral Daily   aspirin EC  81 mg Oral Daily   enoxaparin (LOVENOX) injection  40 mg Subcutaneous W41L   folic acid  1 mg Oral Daily   multivitamin with minerals  1 tablet Oral Daily   sodium zirconium cyclosilicate  5 g Oral Once   thiamine  100 mg Oral Daily   Or   thiamine  100 mg Intravenous Daily   Continuous Infusions:  lactated ringers Stopped (  12/24/20 1157)     Allergies:   No Known Allergies  Social History:   Social History   Socioeconomic History   Marital status: Widowed    Spouse name: Not on file   Number of children: Not on file   Years of education: Not on file   Highest education level: Not on file  Occupational History   Not on file  Tobacco Use   Smoking status: Former    Packs/day: 0.20    Years: 50.00    Pack years: 10.00    Types: Cigarettes    Quit date: 01/22/2002    Years since quitting: 18.9   Smokeless tobacco: Former    Types: Chew    Quit date: 01/23/1964  Substance and Sexual Activity   Alcohol use: Yes    Alcohol/week: 21.0 standard  drinks    Types: 21 Standard drinks or equivalent per week    Comment: Average of about 3 drinks per day.   Drug use: No   Sexual activity: Not on file  Other Topics Concern   Not on file  Social History Narrative   Not on file   Social Determinants of Health   Financial Resource Strain: Not on file  Food Insecurity: Not on file  Transportation Needs: Not on file  Physical Activity: Not on file  Stress: Not on file  Social Connections: Not on file  Intimate Partner Violence: Not on file    Family History:    Family History  Problem Relation Age of Onset   Alzheimer's disease Mother        Died in 34 at age 39.   Cancer Neg Hx    Diabetes Neg Hx    Hypertension Neg Hx    Heart disease Neg Hx      ROS:  Please see the history of present illness.   All other ROS reviewed and negative.     Physical Exam/Data:   Vitals:   12/24/20 0100 12/24/20 0600 12/24/20 0620 12/24/20 0623  BP: (!) 121/93  117/87   Pulse:   (!) 106   Resp:  18 (!) 22 13  Temp: 97.9 F (36.6 C)  98.7 F (37.1 C)   TempSrc: Oral  Oral   SpO2:      Weight:      Height:        Intake/Output Summary (Last 24 hours) at 12/24/2020 1207 Last data filed at 12/24/2020 7782 Gross per 24 hour  Intake 820.72 ml  Output 200 ml  Net 620.72 ml   Last 3 Weights 12/22/2020 08/24/2020 08/17/2020  Weight (lbs) 152 lb 1.9 oz 154 lb 3.2 oz 164 lb 12.8 oz  Weight (kg) 69 kg 69.945 kg 74.753 kg     Body mass index is 20.07 kg/m.  General:  Well nourished, well developed, in no acute distress HEENT: normal  Blind in L eye   Neck: no JVD Vascular: No carotid bruits; Distal pulses 2+ bilaterally Cardiac: Irreg irreg   S1, S2  No S3   III/VI sysltic murmur at apex  Lungs:  clear to auscultation bilaterally, no wheezing, rhonchi or rales  Abd: soft, nontender, no hepatomegaly  Ext: no edema Musculoskeletal:  No deformities, BUE and BLE strength normal and equal Skin: warm Neuro  Deferred      EKG:  The  EKG was personally reviewed and demonstrates:  Atrial fibrillation   T wave inversion inferolateral leads  Prolonged QT   Telemetry:  Telemetry was personally reviewed and  demonstrates:    Afib    Relevant CV Studies: 12/22/20 Echo   eft ventricular ejection fraction, by estimation, is 35 to 40%. The left ventricle has moderately decreased function. The left ventricle demonstrates global hypokinesis. There is moderate left ventricular hypertrophy. Left ventricular diastolic parameters are indeterminate. 1. Right ventricular systolic function is moderately reduced. The right ventricular size is severely enlarged. There is mildly elevated pulmonary artery systolic pressure. The estimated right ventricular systolic pressure is 16.9 mmHg. 2. 3. Left atrial size was severely dilated. 4. Right atrial size was severely dilated. A small to moderte pericardial effusion is present, measures 46mm adjacent to LV inferior wall. There is no evidence of cardiac tamponade. 5. 6. The mitral valve is abnormal. Moderate to severe mitral valve regurgitation. 7. The tricuspid valve is abnormal. Tricuspid valve regurgitation is severe. The aortic valve is tricuspid. Aortic valve regurgitation is mild. Aortic valve sclerosis/calcification is present, without any evidence of aortic stenosis. 8. 9. Aortic dilatation noted. There is dilatation of the ascending aorta, measuring 39 mm. The inferior vena cava is dilated in size with <50% respiratory variability, suggesting right atrial pressure of 15 mmHg.  Laboratory Data:  High Sensitivity Troponin:   Recent Labs  Lab 12/22/20 2009 12/23/20 0412  TROPONINIHS 40* 43*     Chemistry Recent Labs  Lab 12/22/20 2009 12/23/20 0412 12/24/20 0430  NA 141 144 142  K 5.7* 3.0* 3.0*  CL 108 109 109  CO2 23 22 26   GLUCOSE 67* 62* 94  BUN 32* 33* 34*  CREATININE 1.63* 1.57* 1.34*  CALCIUM 9.0 8.7* 8.5*  MG  --   --  1.8  GFRNONAA 42* 44* 53*  ANIONGAP 10  13 7     Recent Labs  Lab 12/22/20 2009  PROT 7.6  ALBUMIN 2.5*  AST 93*  ALT 42  ALKPHOS 118  BILITOT 4.4*   Lipids No results for input(s): CHOL, TRIG, HDL, LABVLDL, LDLCALC, CHOLHDL in the last 168 hours.  Hematology Recent Labs  Lab 12/22/20 2009 12/24/20 0430  WBC 5.5 5.5  RBC 3.74* 3.73*  HGB 10.4* 10.4*  HCT 32.2* 31.2*  MCV 86.1 83.6  MCH 27.8 27.9  MCHC 32.3 33.3  RDW 20.5* 20.3*  PLT 180 149*   Thyroid  Recent Labs  Lab 12/22/20 2009 12/23/20 0412  TSH 5.858*  --   FREET4  --  0.96    BNPNo results for input(s): BNP, PROBNP in the last 168 hours.  DDimer No results for input(s): DDIMER in the last 168 hours.   Radiology/Studies:  DG Chest Portable 1 View  Result Date: 12/22/2020 CLINICAL DATA:  Evaluate for pneumonia. EXAM: PORTABLE CHEST 1 VIEW COMPARISON:  Chest x-ray 10/07/2014. FINDINGS: Cardiac silhouette is markedly enlarged and has increased in size. The lungs are clear. There is no pleural effusion or pneumothorax. No acute fractures are seen. IMPRESSION: 1. Marked enlargement of the cardiac silhouette. This has increased in size. Findings may related to pericardial effusion and/or worsening cardiomegaly. Electronically Signed   By: Ronney Asters M.D.   On: 12/22/2020 19:02   ECHOCARDIOGRAM COMPLETE  Result Date: 12/23/2020    ECHOCARDIOGRAM REPORT   Patient Name:   YARDEN HILLIS Date of Exam: 12/23/2020 Medical Rec #:  [id number]     Height:       73.0 in Accession #:    [id number]    Weight:       152.1 lb Date of Birth:  September 17, 1940      BSA:  1.915 m Patient Age:    73 years      BP:           130/103 mmHg Patient Gender: M             HR:           88 bpm. Exam Location:  Inpatient Procedure: 2D Echo, 3D Echo, Color Doppler and Cardiac Doppler Indications:    I31.3 Pericardial effusion (noninflammatory)  History:        Patient has prior history of Echocardiogram examinations, most                 recent 05/03/2009. CHF, Abnormal ECG, Mitral  Valve Disease;                 Arrythmias:Atrial Fibrillation. ETOH. Cancer. Mild MVP.  Sonographer:    Roseanna Rainbow RDCS Referring Phys: [id number] Bruin  1. Left ventricular ejection fraction, by estimation, is 35 to 40%. The left ventricle has moderately decreased function. The left ventricle demonstrates global hypokinesis. There is moderate left ventricular hypertrophy. Left ventricular diastolic parameters are indeterminate.  2. Right ventricular systolic function is moderately reduced. The right ventricular size is severely enlarged. There is mildly elevated pulmonary artery systolic pressure. The estimated right ventricular systolic pressure is 22.0 mmHg.  3. Left atrial size was severely dilated.  4. Right atrial size was severely dilated.  5. A small to moderte pericardial effusion is present, measures 60mm adjacent to LV inferior wall. There is no evidence of cardiac tamponade.  6. The mitral valve is abnormal. Moderate to severe mitral valve regurgitation.  7. The tricuspid valve is abnormal. Tricuspid valve regurgitation is severe.  8. The aortic valve is tricuspid. Aortic valve regurgitation is mild. Aortic valve sclerosis/calcification is present, without any evidence of aortic stenosis.  9. Aortic dilatation noted. There is dilatation of the ascending aorta, measuring 39 mm. 10. The inferior vena cava is dilated in size with <50% respiratory variability, suggesting right atrial pressure of 15 mmHg. FINDINGS  Left Ventricle: Left ventricular ejection fraction, by estimation, is 35 to 40%. The left ventricle has moderately decreased function. The left ventricle demonstrates global hypokinesis. The left ventricular internal cavity size was normal in size. There is moderate left ventricular hypertrophy. Left ventricular diastolic parameters are indeterminate. Right Ventricle: The right ventricular size is severely enlarged. No increase in right ventricular wall thickness. Right ventricular  systolic function is moderately reduced. There is mildly elevated pulmonary artery systolic pressure. The tricuspid regurgitant velocity is 2.70 m/s, and with an assumed right atrial pressure of 15 mmHg, the estimated right ventricular systolic pressure is 25.4 mmHg. Left Atrium: Left atrial size was severely dilated. Right Atrium: Right atrial size was severely dilated. Pericardium: A small pericardial effusion is present. There is no evidence of cardiac tamponade. Mitral Valve: The mitral valve is abnormal. Moderate to severe mitral valve regurgitation. Tricuspid Valve: The tricuspid valve is abnormal. Tricuspid valve regurgitation is severe. Aortic Valve: The aortic valve is tricuspid. Aortic valve regurgitation is mild. Aortic regurgitation PHT measures 721 msec. Aortic valve sclerosis/calcification is present, without any evidence of aortic stenosis. Pulmonic Valve: The pulmonic valve was grossly normal. Pulmonic valve regurgitation is mild. Aorta: The aortic root is normal in size and structure and aortic dilatation noted. There is dilatation of the ascending aorta, measuring 39 mm. Venous: The inferior vena cava is dilated in size with less than 50% respiratory variability, suggesting right atrial pressure of 15 mmHg. IAS/Shunts:  The interatrial septum was not well visualized.  LEFT VENTRICLE PLAX 2D LVIDd:         5.20 cm LVIDs:         4.70 cm LV PW:         1.30 cm LV IVS:        1.30 cm LVOT diam:     2.30 cm      3D Volume EF: LV SV:         40           3D EF:        39 % LV SV Index:   21           LV EDV:       222 ml LVOT Area:     4.15 cm     LV ESV:       135 ml                             LV SV:        87 ml  LV Volumes (MOD) LV vol d, MOD A2C: 167.0 ml LV vol d, MOD A4C: 171.0 ml LV vol s, MOD A2C: 121.0 ml LV vol s, MOD A4C: 100.0 ml LV SV MOD A2C:     46.0 ml LV SV MOD A4C:     171.0 ml LV SV MOD BP:      62.7 ml RIGHT VENTRICLE            IVC RV S prime:     3.87 cm/s  IVC diam: 3.70 cm TAPSE  (M-mode): 1.2 cm LEFT ATRIUM              Index        RIGHT ATRIUM           Index LA diam:        5.40 cm  2.82 cm/m   RA Area:     55.70 cm LA Vol (A2C):   191.0 ml 99.72 ml/m  RA Volume:   283.00 ml 147.75 ml/m LA Vol (A4C):   109.0 ml 56.91 ml/m LA Biplane Vol: 159.0 ml 83.01 ml/m  AORTIC VALVE             PULMONIC VALVE LVOT Vmax:   82.20 cm/s  PR End Diast Vel: 2.43 msec LVOT Vmean:  47.550 cm/s LVOT VTI:    0.096 m AI PHT:      721 msec  AORTA Ao Root diam: 3.80 cm Ao Asc diam:  3.90 cm MITRAL VALVE                  TRICUSPID VALVE MV Area (PHT): 5.82 cm       TR Peak grad:   29.2 mmHg MV Decel Time: 130 msec       TR Vmax:        270.00 cm/s MR Peak grad:    116.2 mmHg MR Mean grad:    62.5 mmHg    SHUNTS MR Vmax:         539.00 cm/s  Systemic VTI:  0.10 m MR Vmean:        357.5 cm/s   Systemic Diam: 2.30 cm MR PISA:         2.26 cm MR PISA Eff ROA: 16 mm MR PISA Radius:  0.60 cm MV E velocity: 79.55 cm/s Oswaldo Milian MD Electronically signed by Oswaldo Milian MD  Signature Date/Time: 12/23/2020/2:23:00 PM    Final    US Abdomen Limited RUQ (LIVER/GB)  Result Date: 12/22/2020 CLINICAL DATA:  Elevated bilirubin EXAM: ULTRASOUND ABDOMEN LIMITED RIGHT UPPER QUADRANT COMPARISON:  CT 03/30/2014 FINDINGS: Gallbladder: No gallstones or wall thickening visualized. No sonographic Murphy sign noted by sonographer. Common bile duct: Diameter: 3 mm Liver: Nodular liver contour suspicious for cirrhosis. No focal hepatic mass lesion is seen. There is antegrade flow in the main portal vein. Other: Multiple cysts in the right kidney. 5.8 by 5.7 x 4.8 cm mildly complicated right renal cyst contains a small amount of internal debris. IMPRESSION: 1. Cirrhotic morphology of the liver with ascites in the right upper quadrant 2. Negative for gallstones or biliary dilatation 3. Multiple right renal cysts with slightly complex exophytic cyst off the lower pole of right kidney. Dedicated renal sonogram may  be obtained for more complete assessment. Electronically Signed   By: Donavan Foil M.D.   On: 12/22/2020 22:58     Assessment and Plan:   Atrial fibrillation   Pt denies palpitations   Echo shows severe LAE  Note TSH minimllay increased  I have reviewed with pt   He does not sense palpitations    Says he doesn't fall      Pt is CHADSVASc of 4    Should be on anticoagulation if feel he is not a fall or bleed risk and if he is compliant for blood draws  Discuss with primary team  Will start b blocker     2  Systolic CHF   This is a new diagnosis   LV function is moderately down, RV function is also down arguing for afib if rates have been high or NICM   Less CAD     Pt denies having CP ever    FOllow     Tried to discuss with patient possible evaluation for ischemia    He says he feels fine Low Na diet   1500 cc fluid Start carvedilol    Would give IV lasix       Pt on amlodipine  Practically I dont think he would take hydralazine /imdu, let alone other meds   3   MR   MR is moderate to severe    Discussed with pt   Optimally shoulld have TEE to evaluate mechanism once diurese    Pt says he feels fine   4  Pericardial effusion   Present   Not hemodynamically destabilizing  Present in setting of volume increase   For questions or updates, please contact [facility] Please consult [email or web address] for contact info under    Signed, Dorris Carnes, MD  12/24/2020 12:07 PM

## 2020-12-25 DIAGNOSIS — I3139 Other pericardial effusion (noninflammatory): Principal | ICD-10-CM

## 2020-12-25 DIAGNOSIS — F1010 Alcohol abuse, uncomplicated: Secondary | ICD-10-CM | POA: Diagnosis not present

## 2020-12-25 DIAGNOSIS — N179 Acute kidney failure, unspecified: Secondary | ICD-10-CM | POA: Diagnosis not present

## 2020-12-25 DIAGNOSIS — D649 Anemia, unspecified: Secondary | ICD-10-CM | POA: Diagnosis not present

## 2020-12-25 LAB — COMPREHENSIVE METABOLIC PANEL
ALT: 41 U/L (ref 0–44)
AST: 91 U/L — ABNORMAL HIGH (ref 15–41)
Albumin: 2.2 g/dL — ABNORMAL LOW (ref 3.5–5.0)
Alkaline Phosphatase: 108 U/L (ref 38–126)
Anion gap: 6 (ref 5–15)
BUN: 28 mg/dL — ABNORMAL HIGH (ref 8–23)
CO2: 26 mmol/L (ref 22–32)
Calcium: 8.4 mg/dL — ABNORMAL LOW (ref 8.9–10.3)
Chloride: 108 mmol/L (ref 98–111)
Creatinine, Ser: 1.2 mg/dL (ref 0.61–1.24)
GFR, Estimated: >60 mL/min (ref >60)
Glucose, Bld: 99 mg/dL (ref 70–99)
Potassium: 3.4 mmol/L — ABNORMAL LOW (ref 3.5–5.1)
Sodium: 140 mmol/L (ref 135–145)
Total Bilirubin: 3.1 mg/dL — ABNORMAL HIGH (ref 0.3–1.2)
Total Protein: 6.7 g/dL (ref 6.5–8.1)

## 2020-12-25 LAB — CBC
HCT: 31.4 % — ABNORMAL LOW (ref 39.0–52.0)
Hemoglobin: 10.3 g/dL — ABNORMAL LOW (ref 13.0–17.0)
MCH: 27.8 pg (ref 26.0–34.0)
MCHC: 32.8 g/dL (ref 30.0–36.0)
MCV: 84.6 fL (ref 80.0–100.0)
Platelets: 118 10*3/uL — ABNORMAL LOW (ref 150–400)
RBC: 3.71 MIL/uL — ABNORMAL LOW (ref 4.22–5.81)
RDW: 20.6 % — ABNORMAL HIGH (ref 11.5–15.5)
WBC: 5.2 10*3/uL (ref 4.0–10.5)
nRBC: 0.8 % — ABNORMAL HIGH (ref 0.0–0.2)

## 2020-12-25 LAB — MAGNESIUM: Magnesium: 1.8 mg/dL (ref 1.7–2.4)

## 2020-12-25 LAB — URINE CULTURE: Culture: <10000 — AB

## 2020-12-25 MED ORDER — THIAMINE HCL 100 MG/ML IJ SOLN
500.0000 mg | Freq: Three times a day (TID) | INTRAVENOUS | Status: AC
  Administered 2020-12-25 – 2020-12-27 (×6): 500 mg via INTRAVENOUS
  Filled 2020-12-25 (×6): qty 5

## 2020-12-25 MED ORDER — POTASSIUM CHLORIDE CRYS ER 20 MEQ PO TBCR
40.0000 meq | EXTENDED_RELEASE_TABLET | Freq: Once | ORAL | Status: AC
  Administered 2020-12-25: 16:00:00 40 meq via ORAL
  Filled 2020-12-25: qty 2

## 2020-12-25 MED ORDER — LORAZEPAM 1 MG PO TABS
1.0000 mg | ORAL_TABLET | ORAL | Status: DC | PRN
  Administered 2020-12-25: 23:00:00 1 mg via ORAL
  Filled 2020-12-25: qty 1

## 2020-12-25 MED ORDER — ENSURE ENLIVE PO LIQD
237.0000 mL | Freq: Three times a day (TID) | ORAL | Status: DC
  Administered 2020-12-25 – 2021-01-03 (×15): 237 mL via ORAL

## 2020-12-25 NOTE — Progress Notes (Signed)
Progress Note  Patient Name: Tommy Morris Date of Encounter: 12/25/2020  [facility] Cardiologist:   Subjective   Patient sleeping very comfortably flat   Wakes but is groggy    Inpatient Medications    Scheduled Meds:  aspirin EC  81 mg Oral Daily   carvedilol  6.25 mg Oral BID WC   enoxaparin (LOVENOX) injection  40 mg Subcutaneous G31D   folic acid  1 mg Oral Daily   multivitamin with minerals  1 tablet Oral Daily   sodium zirconium cyclosilicate  5 g Oral Once   thiamine  100 mg Oral Daily   Or   thiamine  100 mg Intravenous Daily   Continuous Infusions:  PRN Meds: hydrALAZINE, LORazepam **OR** LORazepam   Vital Signs    Vitals:   12/24/20 1144 12/24/20 1342 12/24/20 1949 12/25/20 0500  BP: (!) 123/92 (!) 118/92 (!) 140/95 107/87  Pulse: 71 88 71 97  Resp: (!) 22 16    Temp: 97.7 F (36.5 C)  97.7 F (36.5 C) 97.9 F (36.6 C)  TempSrc: Oral  Oral Oral  SpO2: 92%  98% 98%  Weight:      Height:        Intake/Output Summary (Last 24 hours) at 12/25/2020 0726 Last data filed at 12/25/2020 0500 Gross per 24 hour  Intake --  Output 2251 ml  Net -2251 ml   Last 3 Weights 12/22/2020 08/24/2020 08/17/2020  Weight (lbs) 152 lb 1.9 oz 154 lb 3.2 oz 164 lb 12.8 oz  Weight (kg) 69 kg 69.945 kg 74.753 kg      Telemetry    Afib 70s    - Personally Reviewed  ECG    No new  - Personally Reviewed  Physical Exam   GEN: No acute distress.  Very sleepy  Neck: No JVD Cardiac: Irreg irreg   II/VI systolic murmur LSB  Respiratory: Clear to auscultation bilaterally. GI: Soft, nontender, non-distended  MS: No edema; No deformity. Neuro:  Nonfocal  Psych: Normal affect   Labs    High Sensitivity Troponin:   Recent Labs  Lab 12/22/20 2009 12/23/20 0412  TROPONINIHS 40* 43*     Chemistry Recent Labs  Lab 12/22/20 2009 12/23/20 0412 12/24/20 0430 12/25/20 0400  NA 141 144 142 140  K 5.7* 3.0* 3.0* 3.4*  CL 108 109 109 108  CO2 23 22 26 26    GLUCOSE 67* 62* 94 99  BUN 32* 33* 34* 28*  CREATININE 1.63* 1.57* 1.34* 1.20  CALCIUM 9.0 8.7* 8.5* 8.4*  MG  --   --  1.8 1.8  PROT 7.6  --   --  6.7  ALBUMIN 2.5*  --   --  2.2*  AST 93*  --   --  91*  ALT 42  --   --  41  ALKPHOS 118  --   --  108  BILITOT 4.4*  --   --  3.1*  GFRNONAA 42* 44* 53* >60  ANIONGAP 10 13 7 6     Lipids No results for input(s): CHOL, TRIG, HDL, LABVLDL, LDLCALC, CHOLHDL in the last 168 hours.  Hematology Recent Labs  Lab 12/22/20 2009 12/24/20 0430 12/25/20 0400  WBC 5.5 5.5 5.2  RBC 3.74* 3.73* 3.71*  HGB 10.4* 10.4* 10.3*  HCT 32.2* 31.2* 31.4*  MCV 86.1 83.6 84.6  MCH 27.8 27.9 27.8  MCHC 32.3 33.3 32.8  RDW 20.5* 20.3* 20.6*  PLT 180 149* 118*   Thyroid  Recent  Labs  Lab 12/22/20 2009 12/23/20 0412  TSH 5.858*  --   FREET4  --  0.96    BNPNo results for input(s): BNP, PROBNP in the last 168 hours.  DDimer No results for input(s): DDIMER in the last 168 hours.   Radiology    ECHOCARDIOGRAM COMPLETE  Result Date: 12/23/2020    ECHOCARDIOGRAM REPORT   Patient Name:   Tommy Morris Date of Exam: 12/23/2020 Medical Rec #:  [id number]     Height:       73.0 in Accession #:    [id number]    Weight:       152.1 lb Date of Birth:  1940-02-18      BSA:          1.915 m Patient Age:    80 years      BP:           130/103 mmHg Patient Gender: M             HR:           88 bpm. Exam Location:  Inpatient Procedure: 2D Echo, 3D Echo, Color Doppler and Cardiac Doppler Indications:    I31.3 Pericardial effusion (noninflammatory)  History:        Patient has prior history of Echocardiogram examinations, most                 recent 05/03/2009. CHF, Abnormal ECG, Mitral Valve Disease;                 Arrythmias:Atrial Fibrillation. ETOH. Cancer. Mild MVP.  Sonographer:    Roseanna Rainbow RDCS Referring Phys: [id number] [place]  1. Left ventricular ejection fraction, by estimation, is 35 to 40%. The left ventricle has moderately decreased function.  The left ventricle demonstrates global hypokinesis. There is moderate left ventricular hypertrophy. Left ventricular diastolic parameters are indeterminate.  2. Right ventricular systolic function is moderately reduced. The right ventricular size is severely enlarged. There is mildly elevated pulmonary artery systolic pressure. The estimated right ventricular systolic pressure is 01.6 mmHg.  3. Left atrial size was severely dilated.  4. Right atrial size was severely dilated.  5. A small to moderte pericardial effusion is present, measures 33mm adjacent to LV inferior wall. There is no evidence of cardiac tamponade.  6. The mitral valve is abnormal. Moderate to severe mitral valve regurgitation.  7. The tricuspid valve is abnormal. Tricuspid valve regurgitation is severe.  8. The aortic valve is tricuspid. Aortic valve regurgitation is mild. Aortic valve sclerosis/calcification is present, without any evidence of aortic stenosis.  9. Aortic dilatation noted. There is dilatation of the ascending aorta, measuring 39 mm. 10. The inferior vena cava is dilated in size with <50% respiratory variability, suggesting right atrial pressure of 15 mmHg. FINDINGS  Left Ventricle: Left ventricular ejection fraction, by estimation, is 35 to 40%. The left ventricle has moderately decreased function. The left ventricle demonstrates global hypokinesis. The left ventricular internal cavity size was normal in size. There is moderate left ventricular hypertrophy. Left ventricular diastolic parameters are indeterminate. Right Ventricle: The right ventricular size is severely enlarged. No increase in right ventricular wall thickness. Right ventricular systolic function is moderately reduced. There is mildly elevated pulmonary artery systolic pressure. The tricuspid regurgitant velocity is 2.70 m/s, and with an assumed right atrial pressure of 15 mmHg, the estimated right ventricular systolic pressure is 01.0 mmHg. Left Atrium: Left atrial  size was severely dilated. Right Atrium: Right atrial size  was severely dilated. Pericardium: A small pericardial effusion is present. There is no evidence of cardiac tamponade. Mitral Valve: The mitral valve is abnormal. Moderate to severe mitral valve regurgitation. Tricuspid Valve: The tricuspid valve is abnormal. Tricuspid valve regurgitation is severe. Aortic Valve: The aortic valve is tricuspid. Aortic valve regurgitation is mild. Aortic regurgitation PHT measures 721 msec. Aortic valve sclerosis/calcification is present, without any evidence of aortic stenosis. Pulmonic Valve: The pulmonic valve was grossly normal. Pulmonic valve regurgitation is mild. Aorta: The aortic root is normal in size and structure and aortic dilatation noted. There is dilatation of the ascending aorta, measuring 39 mm. Venous: The inferior vena cava is dilated in size with less than 50% respiratory variability, suggesting right atrial pressure of 15 mmHg. IAS/Shunts: The interatrial septum was not well visualized.  LEFT VENTRICLE PLAX 2D LVIDd:         5.20 cm LVIDs:         4.70 cm LV PW:         1.30 cm LV IVS:        1.30 cm LVOT diam:     2.30 cm      3D Volume EF: LV SV:         40           3D EF:        39 % LV SV Index:   21           LV EDV:       222 ml LVOT Area:     4.15 cm     LV ESV:       135 ml                             LV SV:        87 ml  LV Volumes (MOD) LV vol d, MOD A2C: 167.0 ml LV vol d, MOD A4C: 171.0 ml LV vol s, MOD A2C: 121.0 ml LV vol s, MOD A4C: 100.0 ml LV SV MOD A2C:     46.0 ml LV SV MOD A4C:     171.0 ml LV SV MOD BP:      62.7 ml RIGHT VENTRICLE            IVC RV S prime:     3.87 cm/s  IVC diam: 3.70 cm TAPSE (M-mode): 1.2 cm LEFT ATRIUM              Index        RIGHT ATRIUM           Index LA diam:        5.40 cm  2.82 cm/m   RA Area:     55.70 cm LA Vol (A2C):   191.0 ml 99.72 ml/m  RA Volume:   283.00 ml 147.75 ml/m LA Vol (A4C):   109.0 ml 56.91 ml/m LA Biplane Vol: 159.0 ml 83.01 ml/m   AORTIC VALVE             PULMONIC VALVE LVOT Vmax:   82.20 cm/s  PR End Diast Vel: 2.43 msec LVOT Vmean:  47.550 cm/s LVOT VTI:    0.096 m AI PHT:      721 msec  AORTA Ao Root diam: 3.80 cm Ao Asc diam:  3.90 cm MITRAL VALVE                  TRICUSPID VALVE MV Area (  PHT): 5.82 cm       TR Peak grad:   29.2 mmHg MV Decel Time: 130 msec       TR Vmax:        270.00 cm/s MR Peak grad:    116.2 mmHg MR Mean grad:    62.5 mmHg    SHUNTS MR Vmax:         539.00 cm/s  Systemic VTI:  0.10 m MR Vmean:        357.5 cm/s   Systemic Diam: 2.30 cm MR PISA:         2.26 cm MR PISA Eff ROA: 16 mm MR PISA Radius:  0.60 cm MV E velocity: 79.55 cm/s Oswaldo Milian MD Electronically signed by Oswaldo Milian MD Signature Date/Time: 12/23/2020/2:23:00 PM    Final     Cardiac Studies   Echo 12/23/20  eft ventricular ejection fraction, by estimation, is 35 to 40%. The left ventricle has moderately decreased function. The left ventricle demonstrates global hypokinesis. There is moderate left ventricular hypertrophy. Left ventricular diastolic parameters are indeterminate. 1. Right ventricular systolic function is moderately reduced. The right ventricular size is severely enlarged. There is mildly elevated pulmonary artery systolic pressure. The estimated right ventricular systolic pressure is 43.3 mmHg. 2. 3. Left atrial size was severely dilated. 4. Right atrial size was severely dilated. A small to moderte pericardial effusion is present, measures 32mm adjacent to LV inferior wall. There is no evidence of cardiac tamponade. 5. 6. The mitral valve is abnormal. Moderate to severe mitral valve regurgitation. 7. The tricuspid valve is abnormal. Tricuspid valve regurgitation is severe. The aortic valve is tricuspid. Aortic valve regurgitation is mild. Aortic valve sclerosis/calcification is present, without any evidence of aortic stenosis. 8. 9. Aortic dilatation noted. There is dilatation of the  ascending aorta, measuring 39 mm. The inferior vena cava is dilated in size with <50% respiratory variability, suggesting right atrial pressure of 15 mmHg.   Patient Profile   Tommy Morris is a 80 y.o. male with a hx of  who is being seen 12/24/2020 for the evaluation of atrial fibrillation at the request of Dr. Louanne Belton.   Assessment & Plan     Atrial fibrillatoin    Rates are controlled  Echo shows signifcant dilation of both atria  That with MR would make chance of ever SR low  CHADSVASc is 4.   Note hospitalist note that it appears patient has refused anitcoagulation  in past     2   Systolic CHF   Pt with biventricular CHF   Pt denies CP    Suspicious that nonischemic  (EtOH, HTN, Afib with rapid rates, idiopathic )      Carvedilol started      Given IV lasix     Not sure he will be compliant.  Optimally would be on hydralziane/NTG   Follow for now   Can initiate as outpt  Can follow up work up as outpt   3  HTN   BP is better today   Keep on carvedilol  4  Pericardial efffusion    I have revewed echo   I would grade as small  Most likely due to CHF   Follow     4  Mitral regurgitation.   MR is mod to severe    Discussed yesterday   He says he feels fine   Optimally would have TEE to eval mechanism   Probable cath     Pt very  sleepy/ groggy now    Did not answer many questions   Not good time to discuss above     For questions or updates, please contact [place] Please consult [email or web address] for contact info under        Signed, Dorris Carnes, MD  12/25/2020, 7:26 AM

## 2020-12-25 NOTE — Progress Notes (Signed)
PROGRESS NOTE  Tommy Morris DDU:[id number] DOB: [birth year] DOA: 12/22/2020 PCP: Axel Filler, MD   LOS: 3 days   Brief narrative:  Tommy Morris is a 80 y.o. male with medical history significant for remote history of bladder cancer s/p transurethral resection in 2376, chronic systolic heart failure, atrial fibrillation and hypertension presented to the hospital with generalized weakness.  Patient lives by himself at home and family had checked on him.  For the last 3 weeks, patient has been complaining of dizziness and difficulty with ambulation with decreased oral intake.  Patient does have history of alcohol consumption.  In the ED, patient was noted to have temperature of 99.2 F.  Patient did not have any leukocytosis.  Potassium was slightly elevated at 5.7.  Creatinine 1.6 from prior 0.9.  AST was elevated at 93 ALT of 42.  Total bilirubin of 4.4.  CK level was 919.  TSH of 5.8.  Chest x-ray showed a concerning for pericardial effusion.  Troponin was elevated at 40.  Bedside ultrasound was obtained in the ED which showed pericardial effusion.  Cardiology was consulted and patient was admitted to hospital with further evaluation and treatment.  Assessment/Plan:  Principal Problem:   Pericardial effusion Active Problems:   Anemia   Alcohol use disorder   Essential hypertension   Atrial fibrillation (HCC)   Chronic systolic heart failure (HCC)   Elevated bilirubin   AKI (acute kidney injury) ([place])  Generalized weakness, failure to thrive.  Pending dietary input. Physical therapy has seen the patient and recommend home health PT.     Pericardial effusion  Unclear etiology.  No evidence of hemodynamic instability.  Flu and COVID was negative. TSH elevated at 5.85 but free T4 within normal range.  EKG with atrial fibrillation with T wave inversion.  2D echocardiogram shows reduced LV function at 35 to 40% with global left ventricular hypokinesis and diastolic dysfunction.   Cardiology has been consulted.  We will follow recommendation.  Hyperbilirubinemia/Elevated liver enzymes Likely secondary to ongoing alcohol usage.  Right upper quadrant ultrasound showing cirrhotic liver with ascites.  AST elevated at 91 with bilirubin of 3.1.  Check INR in AM.  AKI with Elevated CK Creatinine levels were elevated at 1.6 compared to baseline 0.9.  Initially received IV fluids.  Will discontinue.  Creatinine of 1.2 today.  Hyperkalemia followed by hypokalemia Initially received 1 dose of Lokelma.  Potassium low at 3.4 today.  Continue to replenish potassium.  Check levels in a.m.  Essential hypertension  On amlodipine and metoprolol  at home.  Cardiology has seen the patient and currently has been started on Coreg.    Prolonged QT QTC of 585.  On presentation.  Was on metoprolol at home which is on hold.  Coreg has been initiated by cardiology.  Anemia-normocytic Iron panel with low iron at 18 and saturation was low as well.  Vitamin B12 elevated.  Folic acid within normal range.  Latest hemoglobin of 10.3.   Alcohol use with indistinct speech today. Continue thiamine, multivitamin and folic acid.  On CIWA protocol.  No active withdrawal signs noted.  Discontinue CIWA protocol and put on Ativan as needed as needed.  Likely oversedation.   Chronic systolic HF  Received 1 dose of IV Lasix yesterday.  Has been started on Coreg.  Cardiology on board.  We will follow cardiology recommendations.  Atrial fibrillation Has declined anticoagulation in the past.  Has been started on Coreg.  Heart rate is controlled.  DVT prophylaxis: enoxaparin (LOVENOX) injection 40 mg Start: 12/22/20 2215  Code Status: Full code  Family Communication:  [place] with the patient's daughter on the phone on 12/24/2020  Status is: Inpatient  Remains inpatient appropriate because: generalized weakness,   pericardial effusion, acute kidney injury,  hypokalemia,  Consultants: Cardiology  Procedures: None  Anti-infectives:  None  Anti-infectives (From admission, onward)    None      Subjective: Today, patient was seen and examined at bedside.  Patient appears to be poor historian.  Speech a little indistinct today but nonfocal.  Objective: Vitals:   12/24/20 1949 12/25/20 0500  BP: (!) 140/95 107/87  Pulse: 71 97  Resp:    Temp: 97.7 F (36.5 C) 97.9 F (36.6 C)  SpO2: 98% 98%    Intake/Output Summary (Last 24 hours) at 12/25/2020 0751 Last data filed at 12/25/2020 0500 Gross per 24 hour  Intake --  Output 2251 ml  Net -2251 ml    Filed Weights   12/22/20 1807  Weight: 69 kg   Body mass index is 20.07 kg/m.   Physical Exam: General: Thinly built not in obvious distress, indistinctive speech, oriented to place and person. HENT:   No scleral pallor or icterus noted. Oral mucosa is moist.  Cloudiness of the cornea on the left Chest: .  Diminished breath sounds bilaterally.  CVS: S1 &S2 heard. No murmur.  Irregular rhythm, systolic murmur. Abdomen: Soft, nontender, nondistended.  Bowel sounds are heard.   Extremities: No cyanosis, clubbing or edema.  Peripheral pulses are palpable. Psych: Alert, awake and communicative,, normal mood CNS:  No cranial nerve deficits.  Moves extremities.  Indistinct speech. Skin: Warm and dry.  No rashes noted.  Thick scaly skin.   Data Review: I have personally reviewed the following laboratory data and studies,  CBC: Recent Labs  Lab 12/22/20 2009 12/24/20 0430 12/25/20 0400  WBC 5.5 5.5 5.2  NEUTROABS 5.0  --   --   HGB 10.4* 10.4* 10.3*  HCT 32.2* 31.2* 31.4*  MCV 86.1 83.6 84.6  PLT 180 149* 118*    Basic Metabolic Panel: Recent Labs  Lab 12/22/20 2009 12/23/20 0412 12/24/20 0430 12/25/20 0400  NA 141 144 142 140  K 5.7* 3.0* 3.0* 3.4*  CL 108 109 109 108  CO2 23 22 26 26   GLUCOSE 67* 62* 94 99  BUN 32* 33* 34* 28*  CREATININE 1.63* 1.57* 1.34*  1.20  CALCIUM 9.0 8.7* 8.5* 8.4*  MG  --   --  1.8 1.8  PHOS  --   --  3.3  --     Liver Function Tests: Recent Labs  Lab 12/22/20 2009 12/25/20 0400  AST 93* 91*  ALT 42 41  ALKPHOS 118 108  BILITOT 4.4* 3.1*  PROT 7.6 6.7  ALBUMIN 2.5* 2.2*    No results for input(s): LIPASE, AMYLASE in the last 168 hours. No results for input(s): AMMONIA in the last 168 hours. Cardiac Enzymes: Recent Labs  Lab 12/22/20 2009  CKTOTAL 919*    BNP (last 3 results) Recent Labs    07/04/20 1032  BNP 558.9*     ProBNP (last 3 results) No results for input(s): PROBNP in the last 8760 hours.  CBG: No results for input(s): GLUCAP in the last 168 hours. Recent Results (from the past 240 hour(s))  Resp Panel by RT-PCR (Flu A&B, Covid) Nasopharyngeal Swab     Status: None   Collection Time: 12/22/20 10:17 PM   Specimen: Nasopharyngeal  Swab; Nasopharyngeal(NP) swabs in vial transport medium  Result Value Ref Range Status   SARS Coronavirus 2 by RT PCR NEGATIVE NEGATIVE Final    Comment: (NOTE) SARS-CoV-2 target nucleic acids are NOT DETECTED.  The SARS-CoV-2 RNA is generally detectable in upper respiratory specimens during the acute phase of infection. The lowest concentration of SARS-CoV-2 viral copies this assay can detect is 138 copies/mL. A negative result does not preclude SARS-Cov-2 infection and should not be used as the sole basis for treatment or other patient management decisions. A negative result may occur with  improper specimen collection/handling, submission of specimen other than nasopharyngeal swab, presence of viral mutation(s) within the areas targeted by this assay, and inadequate number of viral copies(<138 copies/mL). A negative result must be combined with clinical observations, patient history, and epidemiological information. The expected result is Negative.  Fact Sheet for Patients:  EntrepreneurPulse.com.au  Fact Sheet for Healthcare  Providers:  IncredibleEmployment.be  This test is no t yet approved or cleared by the [place] FDA and  has been authorized for detection and/or diagnosis of SARS-CoV-2 by FDA under an Emergency Use Authorization (EUA). This EUA will remain  in effect (meaning this test can be used) for the duration of the COVID-19 declaration under Section 564(b)(1) of the Act, 21 U.S.C.section 360bbb-3(b)(1), unless the authorization is terminated  or revoked sooner.       Influenza A by PCR NEGATIVE NEGATIVE Final   Influenza B by PCR NEGATIVE NEGATIVE Final    Comment: (NOTE) The Xpert Xpress SARS-CoV-2/FLU/RSV plus assay is intended as an aid in the diagnosis of influenza from Nasopharyngeal swab specimens and should not be used as a sole basis for treatment. Nasal washings and aspirates are unacceptable for Xpert Xpress SARS-CoV-2/FLU/RSV testing.  Fact Sheet for Patients: EntrepreneurPulse.com.au  Fact Sheet for Healthcare Providers: IncredibleEmployment.be  This test is not yet approved or cleared by the [place] FDA and has been authorized for detection and/or diagnosis of SARS-CoV-2 by FDA under an Emergency Use Authorization (EUA). This EUA will remain in effect (meaning this test can be used) for the duration of the COVID-19 declaration under Section 564(b)(1) of the Act, 21 U.S.C. section 360bbb-3(b)(1), unless the authorization is terminated or revoked.  Performed at [facility], [place] [address], [place], [place] [address]       Studies: ECHOCARDIOGRAM COMPLETE  Result Date: 12/23/2020    ECHOCARDIOGRAM REPORT   Patient Name:   Tommy Morris Date of Exam: 12/23/2020 Medical Rec #:  [id number]     Height:       73.0 in Accession #:    [id number]    Weight:       152.1 lb Date of Birth:  [birth year]      BSA:          1.915 m Patient Age:    13 years      BP:           130/103 mmHg Patient Gender: M              HR:           88 bpm. Exam Location:  Inpatient Procedure: 2D Echo, 3D Echo, Color Doppler and Cardiac Doppler Indications:    I31.3 Pericardial effusion (noninflammatory)  History:        Patient has prior history of Echocardiogram examinations, most                 recent 05/03/2009. CHF, Abnormal ECG,  Mitral Valve Disease;                 Arrythmias:Atrial Fibrillation. ETOH. Cancer. Mild MVP.  Sonographer:    Roseanna Rainbow RDCS Referring Phys: [id number] [place]  1. Left ventricular ejection fraction, by estimation, is 35 to 40%. The left ventricle has moderately decreased function. The left ventricle demonstrates global hypokinesis. There is moderate left ventricular hypertrophy. Left ventricular diastolic parameters are indeterminate.  2. Right ventricular systolic function is moderately reduced. The right ventricular size is severely enlarged. There is mildly elevated pulmonary artery systolic pressure. The estimated right ventricular systolic pressure is 91.7 mmHg.  3. Left atrial size was severely dilated.  4. Right atrial size was severely dilated.  5. A small to moderte pericardial effusion is present, measures 20mm adjacent to LV inferior wall. There is no evidence of cardiac tamponade.  6. The mitral valve is abnormal. Moderate to severe mitral valve regurgitation.  7. The tricuspid valve is abnormal. Tricuspid valve regurgitation is severe.  8. The aortic valve is tricuspid. Aortic valve regurgitation is mild. Aortic valve sclerosis/calcification is present, without any evidence of aortic stenosis.  9. Aortic dilatation noted. There is dilatation of the ascending aorta, measuring 39 mm. 10. The inferior vena cava is dilated in size with <50% respiratory variability, suggesting right atrial pressure of 15 mmHg. FINDINGS  Left Ventricle: Left ventricular ejection fraction, by estimation, is 35 to 40%. The left ventricle has moderately decreased function. The left ventricle demonstrates  global hypokinesis. The left ventricular internal cavity size was normal in size. There is moderate left ventricular hypertrophy. Left ventricular diastolic parameters are indeterminate. Right Ventricle: The right ventricular size is severely enlarged. No increase in right ventricular wall thickness. Right ventricular systolic function is moderately reduced. There is mildly elevated pulmonary artery systolic pressure. The tricuspid regurgitant velocity is 2.70 m/s, and with an assumed right atrial pressure of 15 mmHg, the estimated right ventricular systolic pressure is 91.5 mmHg. Left Atrium: Left atrial size was severely dilated. Right Atrium: Right atrial size was severely dilated. Pericardium: A small pericardial effusion is present. There is no evidence of cardiac tamponade. Mitral Valve: The mitral valve is abnormal. Moderate to severe mitral valve regurgitation. Tricuspid Valve: The tricuspid valve is abnormal. Tricuspid valve regurgitation is severe. Aortic Valve: The aortic valve is tricuspid. Aortic valve regurgitation is mild. Aortic regurgitation PHT measures 721 msec. Aortic valve sclerosis/calcification is present, without any evidence of aortic stenosis. Pulmonic Valve: The pulmonic valve was grossly normal. Pulmonic valve regurgitation is mild. Aorta: The aortic root is normal in size and structure and aortic dilatation noted. There is dilatation of the ascending aorta, measuring 39 mm. Venous: The inferior vena cava is dilated in size with less than 50% respiratory variability, suggesting right atrial pressure of 15 mmHg. IAS/Shunts: The interatrial septum was not well visualized.  LEFT VENTRICLE PLAX 2D LVIDd:         5.20 cm LVIDs:         4.70 cm LV PW:         1.30 cm LV IVS:        1.30 cm LVOT diam:     2.30 cm      3D Volume EF: LV SV:         40           3D EF:        39 % LV SV Index:   21  LV EDV:       222 ml LVOT Area:     4.15 cm     LV ESV:       135 ml                              LV SV:        87 ml  LV Volumes (MOD) LV vol d, MOD A2C: 167.0 ml LV vol d, MOD A4C: 171.0 ml LV vol s, MOD A2C: 121.0 ml LV vol s, MOD A4C: 100.0 ml LV SV MOD A2C:     46.0 ml LV SV MOD A4C:     171.0 ml LV SV MOD BP:      62.7 ml RIGHT VENTRICLE            IVC RV S prime:     3.87 cm/s  IVC diam: 3.70 cm TAPSE (M-mode): 1.2 cm LEFT ATRIUM              Index        RIGHT ATRIUM           Index LA diam:        5.40 cm  2.82 cm/m   RA Area:     55.70 cm LA Vol (A2C):   191.0 ml 99.72 ml/m  RA Volume:   283.00 ml 147.75 ml/m LA Vol (A4C):   109.0 ml 56.91 ml/m LA Biplane Vol: 159.0 ml 83.01 ml/m  AORTIC VALVE             PULMONIC VALVE LVOT Vmax:   82.20 cm/s  PR End Diast Vel: 2.43 msec LVOT Vmean:  47.550 cm/s LVOT VTI:    0.096 m AI PHT:      721 msec  AORTA Ao Root diam: 3.80 cm Ao Asc diam:  3.90 cm MITRAL VALVE                  TRICUSPID VALVE MV Area (PHT): 5.82 cm       TR Peak grad:   29.2 mmHg MV Decel Time: 130 msec       TR Vmax:        270.00 cm/s MR Peak grad:    116.2 mmHg MR Mean grad:    62.5 mmHg    SHUNTS MR Vmax:         539.00 cm/s  Systemic VTI:  0.10 m MR Vmean:        357.5 cm/s   Systemic Diam: 2.30 cm MR PISA:         2.26 cm MR PISA Eff ROA: 16 mm MR PISA Radius:  0.60 cm MV E velocity: 79.55 cm/s Oswaldo Milian MD Electronically signed by Oswaldo Milian MD Signature Date/Time: 12/23/2020/2:23:00 PM    Final       Flora Lipps, MD  [facility] 12/25/2020  If 7PM-7AM, please contact night-coverage

## 2020-12-25 NOTE — Progress Notes (Signed)
Initial Nutrition Assessment  DOCUMENTATION CODES:   Not applicable  INTERVENTION:   Ensure Enlive po TID, each supplement provides 350 kcal and 20 grams of protein  MVI and folic acid po daily  Recommend 500 mg of thiamine intravenously, infused over 30 minutes, three times daily for two consecutive days and 250 mg intravenously once daily for an additional five days  Pt at high refeed risk; recommend monitor potassium, magnesium and phosphorus labs daily until stable  NUTRITION DIAGNOSIS:   Inadequate oral intake related to acute illness as evidenced by per patient/family report.  GOAL:   Patient will meet greater than or equal to 90% of their needs  MONITOR:   PO intake, Supplement acceptance, Labs, Weight trends, Skin, I & O's  REASON FOR ASSESSMENT:   Consult Assessment of nutrition requirement/status  ASSESSMENT:   80 y.o. male with medical history significant for bladder cancer s/p transurethral resection in 5462, chronic systolic heart failure, atrial fibrillation, etoh abuse, BPH and hypertension who presented to the hospital with generalized weakness.  RD working remotely.  Unable to reach pt by phone. Per chart review, pt noted to be a poor historian and is likely unable to provide nutrition related history. Per family report, pt with poor oral intake for several weeks pta. Family found pt's thanksgiving plate uneaten in fridge. RD suspects pt with poor appetite and oral intake at baseline r/t etoh abuse. There are no documented meal intakes in chart. RD will add supplements to help pt meet his estimated needs. Pt is at high refeed risk. Would recommend high dose IV thiamine as pt at risk for Wernicke's. Per chart, pt appears fairly weight stable pta. Pt is at high risk for malnutrition. RD will obtain nutrition related history and exam at follow up.   Medications reviewed and include: aspirin, lovenox, folic acid, KCl, thiamine   Labs reviewed: K 3.4(L) P 3.3  wnl, Mg 1.8 wnl- 12/3 Iron 18(L), TIBC 352, folate 6.8, B12 1375- 12/2 Hgb 10.3(L), Hct 31.4(L)  NUTRITION - FOCUSED PHYSICAL EXAM: Unable to perform at this time   Diet Order:   Diet Order             Diet regular Room service appropriate? Yes; Fluid consistency: Thin  Diet effective now                  EDUCATION NEEDS:   No education needs have been identified at this time  Skin:  Skin Assessment: Reviewed RN Assessment  Last BM:  12/3- type 3  Height:   Ht Readings from Last 1 Encounters:  12/22/20 6\' 1"  (1.854 m)    Weight:   Wt Readings from Last 1 Encounters:  12/22/20 69 kg    Ideal Body Weight:  83.6 kg  BMI:  Body mass index is 20.07 kg/m.  Estimated Nutritional Needs:   Kcal:  1900-2200kcal/day  Protein:  95-110g/day  Fluid:  1.7-2.0L/day  Koleen Distance MS, RD, LDN Please refer to [facility] for RD and/or RD on-call/weekend/after hours pager

## 2020-12-26 DIAGNOSIS — G9341 Metabolic encephalopathy: Secondary | ICD-10-CM

## 2020-12-26 DIAGNOSIS — D649 Anemia, unspecified: Secondary | ICD-10-CM | POA: Diagnosis not present

## 2020-12-26 DIAGNOSIS — N179 Acute kidney failure, unspecified: Secondary | ICD-10-CM

## 2020-12-26 DIAGNOSIS — I3139 Other pericardial effusion (noninflammatory): Secondary | ICD-10-CM | POA: Diagnosis not present

## 2020-12-26 DIAGNOSIS — F1010 Alcohol abuse, uncomplicated: Secondary | ICD-10-CM | POA: Diagnosis not present

## 2020-12-26 DIAGNOSIS — I5022 Chronic systolic (congestive) heart failure: Secondary | ICD-10-CM | POA: Diagnosis not present

## 2020-12-26 LAB — BASIC METABOLIC PANEL
Anion gap: 5 (ref 5–15)
BUN: 27 mg/dL — ABNORMAL HIGH (ref 8–23)
CO2: 24 mmol/L (ref 22–32)
Calcium: 8.3 mg/dL — ABNORMAL LOW (ref 8.9–10.3)
Chloride: 112 mmol/L — ABNORMAL HIGH (ref 98–111)
Creatinine, Ser: 1.14 mg/dL (ref 0.61–1.24)
GFR, Estimated: >60 mL/min (ref >60)
Glucose, Bld: 109 mg/dL — ABNORMAL HIGH (ref 70–99)
Potassium: 4.2 mmol/L (ref 3.5–5.1)
Sodium: 141 mmol/L (ref 135–145)

## 2020-12-26 LAB — PROTIME-INR
INR: 1.4 — ABNORMAL HIGH (ref 0.8–1.2)
Prothrombin Time: 17.3 seconds — ABNORMAL HIGH (ref 11.4–15.2)

## 2020-12-26 LAB — CBC
HCT: 31 % — ABNORMAL LOW (ref 39.0–52.0)
Hemoglobin: 10.1 g/dL — ABNORMAL LOW (ref 13.0–17.0)
MCH: 27.7 pg (ref 26.0–34.0)
MCHC: 32.6 g/dL (ref 30.0–36.0)
MCV: 84.9 fL (ref 80.0–100.0)
Platelets: 128 10*3/uL — ABNORMAL LOW (ref 150–400)
RBC: 3.65 MIL/uL — ABNORMAL LOW (ref 4.22–5.81)
RDW: 20.7 % — ABNORMAL HIGH (ref 11.5–15.5)
WBC: 4.6 10*3/uL (ref 4.0–10.5)
nRBC: 0.9 % — ABNORMAL HIGH (ref 0.0–0.2)

## 2020-12-26 LAB — AMMONIA: Ammonia: 29 umol/L (ref 9–35)

## 2020-12-26 LAB — MAGNESIUM: Magnesium: 1.9 mg/dL (ref 1.7–2.4)

## 2020-12-26 MED ORDER — DEXTROSE-NACL 5-0.9 % IV SOLN: INTRAVENOUS | Status: DC

## 2020-12-26 MED ORDER — LACTULOSE 10 GM/15ML PO SOLN
10.0000 g | Freq: Two times a day (BID) | ORAL | Status: DC
  Administered 2020-12-26 – 2021-01-04 (×17): 10 g via ORAL
  Filled 2020-12-26 (×2): qty 15
  Filled 2020-12-26: qty 30
  Filled 2020-12-26 (×2): qty 15
  Filled 2020-12-26: qty 30
  Filled 2020-12-26: qty 15
  Filled 2020-12-26: qty 30
  Filled 2020-12-26: qty 15
  Filled 2020-12-26: qty 30
  Filled 2020-12-26 (×9): qty 15
  Filled 2020-12-26: qty 30

## 2020-12-26 NOTE — Plan of Care (Signed)
Pt denied pain. Pt lethargic in morning, declined breakfast and lunch. Pt more alert after PT session in afternoon.   Problem: Education: Goal: Knowledge of General Education information will improve Description: Including pain rating scale, medication(s)/side effects and non-pharmacologic comfort measures Outcome: Progressing   Problem: Health Behavior/Discharge Planning: Goal: Ability to manage health-related needs will improve Outcome: Progressing   Problem: Clinical Measurements: Goal: Ability to maintain clinical measurements within normal limits will improve Outcome: Progressing Goal: Will remain free from infection Outcome: Progressing Goal: Diagnostic test results will improve Outcome: Progressing Goal: Cardiovascular complication will be avoided Outcome: Progressing   Problem: Activity: Goal: Risk for activity intolerance will decrease Outcome: Progressing   Problem: Nutrition: Goal: Adequate nutrition will be maintained Outcome: Progressing   Problem: Coping: Goal: Level of anxiety will decrease Outcome: Progressing   Problem: Elimination: Goal: Will not experience complications related to bowel motility Outcome: Progressing Goal: Will not experience complications related to urinary retention Outcome: Progressing   Problem: Pain Managment: Goal: General experience of comfort will improve Outcome: Progressing   Problem: Safety: Goal: Ability to remain free from injury will improve Outcome: Progressing   Problem: Skin Integrity: Goal: Risk for impaired skin integrity will decrease Outcome: Progressing

## 2020-12-26 NOTE — Progress Notes (Signed)
Progress Note  Patient Name: Tommy Morris Date of Encounter: 12/26/2020  [facility] Cardiologist:   Subjective   Diuresed about 0.5L overnight. Creatinine has normalized. Potassium improved. He remains very somnolent - difficult to communicate with.  Inpatient Medications    Scheduled Meds:  aspirin EC  81 mg Oral Daily   carvedilol  6.25 mg Oral BID WC   enoxaparin (LOVENOX) injection  40 mg Subcutaneous Q24H   feeding supplement  237 mL Oral TID BM   folic acid  1 mg Oral Daily   lactulose  10 g Oral BID   multivitamin with minerals  1 tablet Oral Daily   Continuous Infusions:  thiamine injection 500 mg (12/26/20 0954)   PRN Meds: hydrALAZINE, LORazepam   Vital Signs    Vitals:   12/25/20 2118 12/26/20 0000 12/26/20 0452 12/26/20 0958  BP: 110/89  110/81 108/87  Pulse: 68  88 60  Resp:  19  18  Temp: 98.2 F (36.8 C)  (!) 97.5 F (36.4 C)   TempSrc: Oral  Oral   SpO2: 93%  95% 100%  Weight:      Height:        Intake/Output Summary (Last 24 hours) at 12/26/2020 1141 Last data filed at 12/26/2020 0600 Gross per 24 hour  Intake 580 ml  Output 1350 ml  Net -770 ml   Last 3 Weights 12/22/2020 08/24/2020 08/17/2020  Weight (lbs) 152 lb 1.9 oz 154 lb 3.2 oz 164 lb 12.8 oz  Weight (kg) 69 kg 69.945 kg 74.753 kg      Telemetry    Afib with CVR - personally reviewed  ECG    N/A  Physical Exam   GEN: Somnolent, but arousable Neck: No JVD, left eye with dense cataract Cardiac: Irreg irreg   II/VI systolic murmur LSB  Respiratory: Clear to auscultation bilaterally. GI: Soft, nontender, non-distended  MS: No edema; No deformity. Neuro:  Nonfocal  Psych: Normal affect   Labs    High Sensitivity Troponin:   Recent Labs  Lab 12/22/20 2009 12/23/20 0412  TROPONINIHS 40* 43*     Chemistry Recent Labs  Lab 12/22/20 2009 12/23/20 0412 12/24/20 0430 12/25/20 0400 12/26/20 0354  NA 141   < > 142 140 141  K 5.7*   < > 3.0* 3.4* 4.2  CL 108    < > 109 108 112*  CO2 23   < > 26 26 24   GLUCOSE 67*   < > 94 99 109*  BUN 32*   < > 34* 28* 27*  CREATININE 1.63*   < > 1.34* 1.20 1.14  CALCIUM 9.0   < > 8.5* 8.4* 8.3*  MG  --   --  1.8 1.8 1.9  PROT 7.6  --   --  6.7  --   ALBUMIN 2.5*  --   --  2.2*  --   AST 93*  --   --  91*  --   ALT 42  --   --  41  --   ALKPHOS 118  --   --  108  --   BILITOT 4.4*  --   --  3.1*  --   GFRNONAA 42*   < > 53* >60 >60  ANIONGAP 10   < > 7 6 5    < > = values in this interval not displayed.    Lipids No results for input(s): CHOL, TRIG, HDL, LABVLDL, LDLCALC, CHOLHDL in the last 168 hours.  Hematology Recent Labs  Lab 12/24/20 0430 12/25/20 0400 12/26/20 0354  WBC 5.5 5.2 4.6  RBC 3.73* 3.71* 3.65*  HGB 10.4* 10.3* 10.1*  HCT 31.2* 31.4* 31.0*  MCV 83.6 84.6 84.9  MCH 27.9 27.8 27.7  MCHC 33.3 32.8 32.6  RDW 20.3* 20.6* 20.7*  PLT 149* 118* 128*   Thyroid  Recent Labs  Lab 12/22/20 2009 12/23/20 0412  TSH 5.858*  --   FREET4  --  0.96    BNPNo results for input(s): BNP, PROBNP in the last 168 hours.  DDimer No results for input(s): DDIMER in the last 168 hours.   Radiology    No results found.  Cardiac Studies   Echo 12/23/20  eft ventricular ejection fraction, by estimation, is 35 to 40%. The left ventricle has moderately decreased function. The left ventricle demonstrates global hypokinesis. There is moderate left ventricular hypertrophy. Left ventricular diastolic parameters are indeterminate. 1. Right ventricular systolic function is moderately reduced. The right ventricular size is severely enlarged. There is mildly elevated pulmonary artery systolic pressure. The estimated right ventricular systolic pressure is 67.6 mmHg. 2. 3. Left atrial size was severely dilated. 4. Right atrial size was severely dilated. A small to moderte pericardial effusion is present, measures 72mm adjacent to LV inferior wall. There is no evidence of cardiac tamponade. 5. 6. The  mitral valve is abnormal. Moderate to severe mitral valve regurgitation. 7. The tricuspid valve is abnormal. Tricuspid valve regurgitation is severe. The aortic valve is tricuspid. Aortic valve regurgitation is mild. Aortic valve sclerosis/calcification is present, without any evidence of aortic stenosis. 8. 9. Aortic dilatation noted. There is dilatation of the ascending aorta, measuring 39 mm. The inferior vena cava is dilated in size with <50% respiratory variability, suggesting right atrial pressure of 15 mmHg.   Patient Profile   WOODROE VOGAN is a 80 y.o. male with a hx of  who is being seen 12/24/2020 for the evaluation of atrial fibrillation at the request of Dr. Louanne Belton.   Assessment & Plan     Atrial fibrillatoin    Rates are controlled  Echo shows signifcant dilation of both atria  That with MR would make chance of ever SR low  CHADSVASc is 4.   Note hospitalist note that it appears patient has refused anitcoagulation  in past.  Tried to address anticoagulation with him today, but he did not want to discuss and said he felt fine. Suspect we should continue with rate-control strategy.  2   Systolic CHF   Pt with biventricular CHF   Pt  denies CP    Suspicious that nonischemic  (EtOH, HTN, Afib with rapid rates, idiopathic )      Carvedilol started      Given IV lasix - minimally net negative, creatinine improving - would continue and transition to PO soon.  3  HTN   BP is better today   Keep on carvedilol  4  Pericardial efffusion    I have revewed echo   I would grade as small  Most likely due to CHF   Follow     4  Mitral regurgitation.   MR is mod to severe    Discussed yesterday   He says he feels fine   Optimally would have TEE to eval mechanism, however, doubt he will be a candidate for MV repair in his condition.  Need to determine goals of care - difficult to communicate with, not sure he is a candidate for any procedures  at this point. Palliative care evaluation would  be very reasonable.  For questions or updates, please contact [place] Please consult [email or web address] for contact info under   Pixie Casino, MD, FACC, [place] Director of the [facility] Diplomate of the American Board of Clinical Lipidology Attending Cardiologist  Direct Dial: [phone]  Fax: [phone]  Website:  [email or web address]  Pixie Casino, MD  12/26/2020, 11:41 AM

## 2020-12-26 NOTE — Progress Notes (Signed)
Physical Therapy Treatment Patient Details Name: Tommy Morris MRN: [id number] DOB: 04-Jun-1940 Today's Date: 12/26/2020   History of Present Illness Pt is 80 yo male admitted on 12/22/20 with pericardial effusion, afib,, AKI, hyperkalemia, ETOH use, Metabolic encephalopathy.  Pt with hx of  bladder cancer s/p transurethral resection in 7253, chronic systolic heart failure, atrial fibrillation and hypertension, and ETOH use    PT Comments    Pt with significant decline in mobility since evaluation.  He was lethargic (noted received Ativan last night around 22:00, also ETOH use with met encephalopathy).  Pt requiring mod A of 2 for transfers and only able to ambulate 6'.  Pt with posterior lean requiring mod-max A.  Session focused on balance and transfers.  Updated recommendations to SNF.  Notified TOC and MD.    Recommendations for follow up therapy are one component of a multi-disciplinary discharge planning process, led by the attending physician.  Recommendations may be updated based on patient status, additional functional criteria and insurance authorization.  Follow Up Recommendations  Skilled nursing-short term rehab (<3 hours/day)     Assistance Recommended at Discharge Frequent or constant Supervision/Assistance  Equipment Recommendations  Rolling walker (2 wheels)    Recommendations for Other Services       Precautions / Restrictions Precautions Precautions: Fall     Mobility  Bed Mobility Overal bed mobility: Needs Assistance Bed Mobility: Supine to Sit     Supine to sit: Mod assist     General bed mobility comments: Heavy mod A with multimodal cues - required assist for trunk and legs.   (was mod I at evaluation)    Transfers Overall transfer level: Needs assistance Equipment used: Rolling walker (2 wheels) Transfers: Sit to/from Stand Sit to Stand: Mod assist;+2 physical assistance           General transfer comment: Significant work on EOB balance  before standing (see balance).  Required mod A of 2 to rise and steady from elevated bed.  Cued for posture/balance as pt with strong posterior lean requiring assist of 2. (see balance)    Ambulation/Gait Ambulation/Gait assistance: Mod assist;+2 physical assistance Gait Distance (Feet): 6 Feet Assistive device: Rolling walker (2 wheels) Gait Pattern/deviations: Step-through pattern;Decreased stride length;Trunk flexed;Shuffle Gait velocity: decreased     General Gait Details: Pt still with posterior lean requiring mod A and assist to weight shift to advance legs.  Pt with shuffle gait , wide BOS, and very small steps.  Had chair close by. With fatigue -knees with increased flexion and pt requiring chair.   Stairs             Wheelchair Mobility    Modified Rankin (Stroke Patients Only)       Balance Overall balance assessment: Needs assistance Sitting-balance support: Feet supported;Bilateral upper extremity supported Sitting balance-Leahy Scale: Poor Sitting balance - Comments: Pt with strong posterior lean requiring mod A for balance initially.  Sat EOB for 10 mins working on leaning/reaching (started hands on knees) forward in order to progress to close guarding.  Pt did best with cues to reach forward to feet and then would maintain upright a few seconds before going posterior again.   Standing balance support: Bilateral upper extremity supported Standing balance-Leahy Scale: Zero Standing balance comment: Posterior lean requiring mod A of 2 initially, progressed to mod A.  Tried cues to increase weight on toes, push into RW, lean forward -but not improving.  Tried releasing pressure to allow pt to see he was  going posteriorly and he could slightly correct but not completely.  Very slight improvement with forward steps but still needing mod-max A (2 for safety)                            Cognition Arousal/Alertness: Lethargic Behavior During Therapy: WFL for  tasks assessed/performed Overall Cognitive Status: No family/caregiver present to determine baseline cognitive functioning Area of Impairment: Orientation;Attention;Following commands;Safety/judgement;Awareness;Problem solving                 Orientation Level: Disoriented to;Place;Time;Situation Current Attention Level: Sustained   Following Commands: Follows one step commands inconsistently;Follows one step commands with increased time Safety/Judgement: Decreased awareness of deficits Awareness: Intellectual Problem Solving: Slow processing;Decreased initiation;Difficulty sequencing;Requires verbal cues;Requires tactile cues General Comments: Pt with increased lethargy with more slurred speech since evaluation on 12/24/20.  No family present to determine baseline.        Exercises      General Comments General comments (skin integrity, edema, etc.): VSS; Bil strength equal, no drift or face droop noted      Pertinent Vitals/Pain Pain Assessment: No/denies pain    Home Living                          Prior Function            PT Goals (current goals can now be found in the care plan section) Progress towards PT goals: Not progressing toward goals - comment (decline in lethargy)    Frequency    Min 3X/week      PT Plan Discharge plan needs to be updated    Co-evaluation              AM-PAC PT "6 Clicks" Mobility   Outcome Measure  Help needed turning from your back to your side while in a flat bed without using bedrails?: A Lot Help needed moving from lying on your back to sitting on the side of a flat bed without using bedrails?: A Lot Help needed moving to and from a bed to a chair (including a wheelchair)?: Total Help needed standing up from a chair using your arms (e.g., wheelchair or bedside chair)?: Total Help needed to walk in hospital room?: Total Help needed climbing 3-5 steps with a railing? : Total 6 Click Score: 8    End of  Session Equipment Utilized During Treatment: Gait belt Activity Tolerance: Patient tolerated treatment well Patient left: with chair alarm set;in chair;with call bell/phone within reach Nurse Communication: Mobility status;Other (comment) (need for assist of 2; also notified RN, TOC, MD of change in mobility and need for SNF) PT Visit Diagnosis: Other abnormalities of gait and mobility (R26.89);Muscle weakness (generalized) (M62.81)     Time: 9584-4171 PT Time Calculation (min) (ACUTE ONLY): 32 min  Charges:  $Therapeutic Activity: 8-22 mins $Neuromuscular Re-education: 8-22 mins                     Abran Richard, PT Acute Rehab Services Pager [phone] [facility] [place] 12/26/2020, 4:09 PM

## 2020-12-26 NOTE — Progress Notes (Addendum)
PROGRESS NOTE  Tommy Morris PPJ:[id number] DOB: 16-Mar-1940 DOA: 12/22/2020 PCP: Axel Filler, MD   LOS: 4 days   Brief narrative:  Tommy Morris is a 80 y.o. male with medical history significant for remote history of bladder cancer s/p transurethral resection in 5809, chronic systolic heart failure, atrial fibrillation and hypertension presented to the hospital with generalized weakness.  Patient lives by himself at home and family had checked on him.  For the last 3 weeks, patient has been complaining of dizziness and difficulty with ambulation with decreased oral intake.  Patient does have history of alcohol consumption.  In the ED, patient was noted to have temperature of 99.2 F.  Patient did not have any leukocytosis.  Potassium was slightly elevated at 5.7.  Creatinine 1.6 from prior 0.9.  AST was elevated at 93 ALT of 42.  Total bilirubin of 4.4.  CK level was 919.  TSH of 5.8.  Chest x-ray showed a concerning for pericardial effusion.  Troponin was elevated at 40.  Bedside ultrasound was obtained in the ED which showed pericardial effusion.  Cardiology was consulted and patient was admitted to hospital with further evaluation and treatment.  Assessment/Plan:  Principal Problem:   Pericardial effusion Active Problems:   Anemia   Alcohol use disorder   Essential hypertension   Atrial fibrillation (HCC)   Chronic systolic heart failure (HCC)   Elevated bilirubin   AKI (acute kidney injury) ([place])  Generalized weakness, failure to thrive.  Seen by dietary services. Physical therapy has seen the patient and recommend home health PT. will likely need reassessment due to declining mentation.  Risk for refeeding syndrome.  Continue thiamine folic acid.  On oral supplements.  Pericardial effusion  Unclear etiology.  No evidence of hemodynamic instability.  Flu and COVID was negative. TSH elevated at 5.85 but free T4 within normal range.  EKG with atrial fibrillation with T wave  inversion.  2D echocardiogram shows reduced LV function at 35 to 40% with global left ventricular hypokinesis and diastolic dysfunction.  Cardiology on board.  Thought to be likely secondary to CHF.  Moderate to severe mitral regurgitation. Cardiology following.  Hyperbilirubinemia/Elevated liver enzymes Likely secondary to ongoing alcohol usage.  Right upper quadrant ultrasound showing cirrhotic liver with ascites.  AST elevated at 91 with bilirubin of 3.1.  Check INR in AM.  AKI with Elevated CK Creatinine levels were elevated at 1.6 compared to baseline 0.9.  Initially received IV fluids.  Will discontinue.  Creatinine of 1.1 today.  Hyperkalemia followed by hypokalemia Improved with p.o. potassium.  Latest potassium of 4.2.  Magnesium 1.9.  Essential hypertension  On amlodipine and metoprolol  at home.  Currently on Coreg and blood pressure is okay.    Anemia-normocytic Iron panel with low iron at 18 and saturation was low as well.  Vitamin B12 elevated.  Folic acid within normal range.  Latest hemoglobin of 10.1   Alcohol use , indistinct speech possible mild metabolic encephalopathy.. Possibility of underlying dementia from alcohol or Alzheimer's with possible encephalopathy from medication, could not rule out component of Warnicke's encephalopathy.  I have started patient on high-dose IV thiamine 500 mg IV 3 times daily for 2 days this will be followed by IV daily.  Continue, multivitamin and folic acid.  On as needed Ativan for agitation.  We will get palliative care consultation for goals of care discussion.  We will check ammonia level.  Add the lactulose for now.  Chronic systolic HF  Likely nonischemic from alcohol hypertension A. fib.  Has been started on Coreg.  Cardiology on board.  Might ideally be on hydralazine and nitroglycerin which can be initiated as outpatient as per cardiology.  Atrial fibrillation Has declined anticoagulation in the past.  Has been started on  Coreg.  Heart rate is controlled.  Ethics/goals of care.  Patient with multiple comorbidities at advanced age with ongoing alcohol use and encephalopathy.  Appears to have overall poor to limited prognosis.  We will get palliative care consultation for discussion about goals of care.  Patient's daughter feels like patient likely has underlying baseline dementia.  DVT prophylaxis: enoxaparin (LOVENOX) injection 40 mg Start: 12/22/20 2215  Code Status: Full code  Family Communication:   Spoke with the patient's daughter on the phone on on 12/26/2020 and updated her about the clinical condition of the patient.  Spoke about CODE STATUS but she is going to discuss with him and make a decision later on.  Status is: Inpatient  Remains inpatient appropriate because: generalized weakness,   pericardial effusion, deconditioning, encephalopathy.  Consultants: Cardiology Palliative care  Procedures: None  Anti-infectives:  None  Anti-infectives (From admission, onward)    None      Subjective:  Today, patient was seen and examined at bedside.  Poor historian.  Mumbling and indistinctive speech and appears sleepy.    Objective: Vitals:   12/26/20 0452 12/26/20 0958  BP: 110/81 108/87  Pulse: 88 60  Resp:  18  Temp: (!) 97.5 F (36.4 C)   SpO2: 95% 100%    Intake/Output Summary (Last 24 hours) at 12/26/2020 1035 Last data filed at 12/26/2020 0600 Gross per 24 hour  Intake 580 ml  Output 1350 ml  Net -770 ml    Filed Weights   12/22/20 1807  Weight: 69 kg   Body mass index is 20.07 kg/m.   Physical Exam: General: Thinly built not in obvious distress, indistinctive speech, hard to understand, answering few questions,  HENT:   No scleral pallor or icterus noted. Oral mucosa is moist.  Cloudiness of the cornea on the left Chest: .  Diminished breath sounds bilaterally.  CVS: S1 &S2 heard. No murmur.  Irregular rhythm, systolic murmur. Abdomen: Soft, nontender,  nondistended.  Bowel sounds are heard.   Extremities: No cyanosis, clubbing or edema.  Peripheral pulses are palpable. Psych: Mildly sleepy, indistinct speech, answering few questions, CNS:  No cranial nerve deficits.  Moves extremities.  Indistinct speech. Skin: Warm and dry.  No rashes noted.  Thick scaly skin.  Data Review: I have personally reviewed the following laboratory data and studies,  CBC: Recent Labs  Lab 12/22/20 2009 12/24/20 0430 12/25/20 0400 12/26/20 0354  WBC 5.5 5.5 5.2 4.6  NEUTROABS 5.0  --   --   --   HGB 10.4* 10.4* 10.3* 10.1*  HCT 32.2* 31.2* 31.4* 31.0*  MCV 86.1 83.6 84.6 84.9  PLT 180 149* 118* 128*    Basic Metabolic Panel: Recent Labs  Lab 12/22/20 2009 12/23/20 0412 12/24/20 0430 12/25/20 0400 12/26/20 0354  NA 141 144 142 140 141  K 5.7* 3.0* 3.0* 3.4* 4.2  CL 108 109 109 108 112*  CO2 23 22 26 26 24   GLUCOSE 67* 62* 94 99 109*  BUN 32* 33* 34* 28* 27*  CREATININE 1.63* 1.57* 1.34* 1.20 1.14  CALCIUM 9.0 8.7* 8.5* 8.4* 8.3*  MG  --   --  1.8 1.8 1.9  PHOS  --   --  3.3  --   --  Liver Function Tests: Recent Labs  Lab 12/22/20 2009 12/25/20 0400  AST 93* 91*  ALT 42 41  ALKPHOS 118 108  BILITOT 4.4* 3.1*  PROT 7.6 6.7  ALBUMIN 2.5* 2.2*    No results for input(s): LIPASE, AMYLASE in the last 168 hours. No results for input(s): AMMONIA in the last 168 hours. Cardiac Enzymes: Recent Labs  Lab 12/22/20 2009  CKTOTAL 919*    BNP (last 3 results) Recent Labs    07/04/20 1032  BNP 558.9*     ProBNP (last 3 results) No results for input(s): PROBNP in the last 8760 hours.  CBG: No results for input(s): GLUCAP in the last 168 hours. Recent Results (from the past 240 hour(s))  Urine Culture     Status: Abnormal   Collection Time: 12/22/20  6:17 PM   Specimen: Urine, Clean Catch  Result Value Ref Range Status   Specimen Description   Final    URINE, CLEAN CATCH Performed at [facility],  [place] [address] [place], [place] [address]    Special Requests   Final    NONE Performed at [facility], [place] [address], [place], [place] [address]    Culture (A)  Final    <10,000 COLONIES/mL INSIGNIFICANT GROWTH Performed at [place] [address], [place], [place] [address]    Report Status 12/25/2020 FINAL  Final  Resp Panel by RT-PCR (Flu A&B, Covid) Nasopharyngeal Swab     Status: None   Collection Time: 12/22/20 10:17 PM   Specimen: Nasopharyngeal Swab; Nasopharyngeal(NP) swabs in vial transport medium  Result Value Ref Range Status   SARS Coronavirus 2 by RT PCR NEGATIVE NEGATIVE Final    Comment: (NOTE) SARS-CoV-2 target nucleic acids are NOT DETECTED.  The SARS-CoV-2 RNA is generally detectable in upper respiratory specimens during the acute phase of infection. The lowest concentration of SARS-CoV-2 viral copies this assay can detect is 138 copies/mL. A negative result does not preclude SARS-Cov-2 infection and should not be used as the sole basis for treatment or other patient management decisions. A negative result may occur with  improper specimen collection/handling, submission of specimen other than nasopharyngeal swab, presence of viral mutation(s) within the areas targeted by this assay, and inadequate number of viral copies(<138 copies/mL). A negative result must be combined with clinical observations, patient history, and epidemiological information. The expected result is Negative.  Fact Sheet for Patients:  EntrepreneurPulse.com.au  Fact Sheet for Healthcare Providers:  IncredibleEmployment.be  This test is no t yet approved or cleared by the [place] FDA and  has been authorized for detection and/or diagnosis of SARS-CoV-2 by FDA under an Emergency Use Authorization (EUA). This EUA will remain  in effect (meaning this test can be used) for the duration of the COVID-19 declaration  under Section 564(b)(1) of the Act, 21 U.S.C.section 360bbb-3(b)(1), unless the authorization is terminated  or revoked sooner.       Influenza A by PCR NEGATIVE NEGATIVE Final   Influenza B by PCR NEGATIVE NEGATIVE Final    Comment: (NOTE) The Xpert Xpress SARS-CoV-2/FLU/RSV plus assay is intended as an aid in the diagnosis of influenza from Nasopharyngeal swab specimens and should not be used as a sole basis for treatment. Nasal washings and aspirates are unacceptable for Xpert Xpress SARS-CoV-2/FLU/RSV testing.  Fact Sheet for Patients: EntrepreneurPulse.com.au  Fact Sheet for Healthcare Providers: IncredibleEmployment.be  This test is not yet approved or cleared by the [place] FDA and has been  authorized for detection and/or diagnosis of SARS-CoV-2 by FDA under an Emergency Use Authorization (EUA). This EUA will remain in effect (meaning this test can be used) for the duration of the COVID-19 declaration under Section 564(b)(1) of the Act, 21 U.S.C. section 360bbb-3(b)(1), unless the authorization is terminated or revoked.  Performed at [facility], [place] [address] [place], [place] [address]       Studies: No results found.   Flora Lipps, MD  [facility] 12/26/2020  If 7PM-7AM, please contact night-coverage

## 2020-12-27 ENCOUNTER — Inpatient Hospital Stay (HOSPITAL_COMMUNITY): Payer: Medicare Other

## 2020-12-27 DIAGNOSIS — N179 Acute kidney failure, unspecified: Secondary | ICD-10-CM | POA: Diagnosis not present

## 2020-12-27 DIAGNOSIS — D649 Anemia, unspecified: Secondary | ICD-10-CM | POA: Diagnosis not present

## 2020-12-27 DIAGNOSIS — I4819 Other persistent atrial fibrillation: Secondary | ICD-10-CM | POA: Diagnosis not present

## 2020-12-27 DIAGNOSIS — I3139 Other pericardial effusion (noninflammatory): Secondary | ICD-10-CM | POA: Diagnosis not present

## 2020-12-27 DIAGNOSIS — R627 Adult failure to thrive: Secondary | ICD-10-CM

## 2020-12-27 DIAGNOSIS — F1010 Alcohol abuse, uncomplicated: Secondary | ICD-10-CM | POA: Diagnosis not present

## 2020-12-27 DIAGNOSIS — I5022 Chronic systolic (congestive) heart failure: Secondary | ICD-10-CM | POA: Diagnosis not present

## 2020-12-27 LAB — CBC
HCT: 32 % — ABNORMAL LOW (ref 39.0–52.0)
Hemoglobin: 10.4 g/dL — ABNORMAL LOW (ref 13.0–17.0)
MCH: 28.3 pg (ref 26.0–34.0)
MCHC: 32.5 g/dL (ref 30.0–36.0)
MCV: 87 fL (ref 80.0–100.0)
Platelets: 117 10*3/uL — ABNORMAL LOW (ref 150–400)
RBC: 3.68 MIL/uL — ABNORMAL LOW (ref 4.22–5.81)
RDW: 21 % — ABNORMAL HIGH (ref 11.5–15.5)
WBC: 3.8 10*3/uL — ABNORMAL LOW (ref 4.0–10.5)
nRBC: 1.6 % — ABNORMAL HIGH (ref 0.0–0.2)

## 2020-12-27 LAB — BASIC METABOLIC PANEL
Anion gap: 8 (ref 5–15)
BUN: 28 mg/dL — ABNORMAL HIGH (ref 8–23)
CO2: 25 mmol/L (ref 22–32)
Calcium: 8.8 mg/dL — ABNORMAL LOW (ref 8.9–10.3)
Chloride: 110 mmol/L (ref 98–111)
Creatinine, Ser: 1.35 mg/dL — ABNORMAL HIGH (ref 0.61–1.24)
GFR, Estimated: 52 mL/min — ABNORMAL LOW (ref >60)
Glucose, Bld: 110 mg/dL — ABNORMAL HIGH (ref 70–99)
Potassium: 4.2 mmol/L (ref 3.5–5.1)
Sodium: 143 mmol/L (ref 135–145)

## 2020-12-27 LAB — MAGNESIUM: Magnesium: 1.9 mg/dL (ref 1.7–2.4)

## 2020-12-27 MED ORDER — DEXTROSE-NACL 5-0.9 % IV SOLN: INTRAVENOUS | Status: DC

## 2020-12-27 MED ORDER — DEXTROSE-NACL 5-0.9 % IV SOLN: INTRAVENOUS | Status: AC

## 2020-12-27 MED ORDER — THIAMINE HCL 100 MG/ML IJ SOLN
250.0000 mg | Freq: Every day | INTRAVENOUS | Status: DC
  Filled 2020-12-27: qty 2.5

## 2020-12-27 MED ORDER — THIAMINE HCL 100 MG/ML IJ SOLN
250.0000 mg | Freq: Every day | INTRAVENOUS | Status: AC
  Administered 2020-12-28 – 2021-01-01 (×5): 250 mg via INTRAVENOUS
  Filled 2020-12-27 (×5): qty 2.5

## 2020-12-27 NOTE — Progress Notes (Signed)
Patient more awake and alert starting 11:30a. Patient alert and orientated to self, place, and time. Disorientated to situation. Patient able to sit up and eat 25% of lunch and drank 100% of ensure.

## 2020-12-27 NOTE — Care Management Important Message (Signed)
Important Message  Patient Details IM Letter given to the Patient. Name: Tommy Morris MRN: [id number] Date of Birth: [birth year]   Medicare Important Message Given:  Yes     Kerin Salen 12/27/2020, 11:55 AM

## 2020-12-27 NOTE — Progress Notes (Signed)
MRI tech called stating they are trying to reach family to validate medical history so they can safely complete MRI. MRI tech stated they left a voicemail for the family.

## 2020-12-27 NOTE — Progress Notes (Signed)
Progress Note  Patient Name: Tommy Morris Date of Encounter: 12/27/2020  [facility] Cardiologist:   Subjective   Net positive overnight - not on diuretics. BP Stable. Creatinine rising up to 1.35. Mental status has not improved.  Inpatient Medications    Scheduled Meds:  aspirin EC  81 mg Oral Daily   carvedilol  6.25 mg Oral BID WC   enoxaparin (LOVENOX) injection  40 mg Subcutaneous Q24H   feeding supplement  237 mL Oral TID BM   folic acid  1 mg Oral Daily   lactulose  10 g Oral BID   multivitamin with minerals  1 tablet Oral Daily   Continuous Infusions:  dextrose 5 % and 0.9% NaCl 50 mL/hr at 12/27/20 1051   thiamine injection     PRN Meds: hydrALAZINE, LORazepam   Vital Signs    Vitals:   12/26/20 1636 12/26/20 2131 12/27/20 0605 12/27/20 0822  BP: (!) 120/99 104/84 103/84 102/76  Pulse:  (!) 56 84 73  Resp: 15 14 16    Temp: (!) 97.5 F (36.4 C) 98.5 F (36.9 C) 98.5 F (36.9 C)   TempSrc: Axillary     SpO2:  99% 100%   Weight:      Height:        Intake/Output Summary (Last 24 hours) at 12/27/2020 1106 Last data filed at 12/27/2020 0500 Gross per 24 hour  Intake 749.13 ml  Output 200 ml  Net 549.13 ml   Last 3 Weights 12/22/2020 08/24/2020 08/17/2020  Weight (lbs) 152 lb 1.9 oz 154 lb 3.2 oz 164 lb 12.8 oz  Weight (kg) 69 kg 69.945 kg 74.753 kg      Telemetry    Afib with CVR - personally reviewed  ECG    N/A  Physical Exam   GEN: Somnolent, but arousable Neck: No JVD, left eye with dense cataract Cardiac: Irreg irreg   II/VI systolic murmur LSB  Respiratory: Clear to auscultation bilaterally. GI: Soft, nontender, non-distended  MS: No edema; No deformity. Neuro:  Nonfocal  Psych: Normal affect   Labs    High Sensitivity Troponin:   Recent Labs  Lab 12/22/20 2009 12/23/20 0412  TROPONINIHS 40* 43*     Chemistry Recent Labs  Lab 12/22/20 2009 12/23/20 0412 12/25/20 0400 12/26/20 0354 12/27/20 0406  NA 141   < > 140  141 143  K 5.7*   < > 3.4* 4.2 4.2  CL 108   < > 108 112* 110  CO2 23   < > 26 24 25   GLUCOSE 67*   < > 99 109* 110*  BUN 32*   < > 28* 27* 28*  CREATININE 1.63*   < > 1.20 1.14 1.35*  CALCIUM 9.0   < > 8.4* 8.3* 8.8*  MG  --    < > 1.8 1.9 1.9  PROT 7.6  --  6.7  --   --   ALBUMIN 2.5*  --  2.2*  --   --   AST 93*  --  91*  --   --   ALT 42  --  41  --   --   ALKPHOS 118  --  108  --   --   BILITOT 4.4*  --  3.1*  --   --   GFRNONAA 42*   < > >60 >60 52*  ANIONGAP 10   < > 6 5 8    < > = values in this interval not displayed.  Lipids No results for input(s): CHOL, TRIG, HDL, LABVLDL, LDLCALC, CHOLHDL in the last 168 hours.  Hematology Recent Labs  Lab 12/25/20 0400 12/26/20 0354 12/27/20 0406  WBC 5.2 4.6 3.8*  RBC 3.71* 3.65* 3.68*  HGB 10.3* 10.1* 10.4*  HCT 31.4* 31.0* 32.0*  MCV 84.6 84.9 87.0  MCH 27.8 27.7 28.3  MCHC 32.8 32.6 32.5  RDW 20.6* 20.7* 21.0*  PLT 118* 128* 117*   Thyroid  Recent Labs  Lab 12/22/20 2009 12/23/20 0412  TSH 5.858*  --   FREET4  --  0.96    BNPNo results for input(s): BNP, PROBNP in the last 168 hours.  DDimer No results for input(s): DDIMER in the last 168 hours.   Radiology    No results found.  Cardiac Studies   Echo 12/23/20  eft ventricular ejection fraction, by estimation, is 35 to 40%. The left ventricle has moderately decreased function. The left ventricle demonstrates global hypokinesis. There is moderate left ventricular hypertrophy. Left ventricular diastolic parameters are indeterminate. 1. Right ventricular systolic function is moderately reduced. The right ventricular size is severely enlarged. There is mildly elevated pulmonary artery systolic pressure. The estimated right ventricular systolic pressure is 62.6 mmHg. 2. 3. Left atrial size was severely dilated. 4. Right atrial size was severely dilated. A small to moderte pericardial effusion is present, measures 68mm adjacent to LV inferior wall. There  is no evidence of cardiac tamponade. 5. 6. The mitral valve is abnormal. Moderate to severe mitral valve regurgitation. 7. The tricuspid valve is abnormal. Tricuspid valve regurgitation is severe. The aortic valve is tricuspid. Aortic valve regurgitation is mild. Aortic valve sclerosis/calcification is present, without any evidence of aortic stenosis. 8. 9. Aortic dilatation noted. There is dilatation of the ascending aorta, measuring 39 mm. The inferior vena cava is dilated in size with <50% respiratory variability, suggesting right atrial pressure of 15 mmHg.   Patient Profile   Tommy Morris is a 80 y.o. male with a hx of  who is being seen 12/24/2020 for the evaluation of atrial fibrillation at the request of Dr. Louanne Belton.   Assessment & Plan     Atrial fibrillatoin    Rates are controlled  Echo shows signifcant dilation of both atria  That with MR would make chance of ever SR low  CHADSVASc is 4.   Note hospitalist note that it appears patient has refused anitcoagulation  in past.  Tried to address anticoagulation with him today, but he did not want to discuss and said he felt fine. Suspect we should continue with rate-control strategy.  2   Systolic CHF   Pt with biventricular CHF   Pt  denies CP    Suspicious that nonischemic  (EtOH, HTN, Afib with rapid rates, idiopathic )      Carvedilol started      Given IV lasix - minimally net negative, creatinine now rising - little po intake, now off lasix on low dose IVF's - would be cautious with fluids  3  HTN   BP controlled  4  Pericardial efffusion - small, likely related to CHF  4  Mitral regurgitation.   MR is mod to severe , may improve with diuresis, not a candidate for MV repair in his condition.  Await palliative care recommendations.  For questions or updates, please contact [place] Please consult [email or web address] for contact info under   Pixie Casino, MD, FACC, [place]  Director of the  Advanced Lipid Disorders &  Cardiovascular Risk Reduction Clinic Diplomate of the American Board of Clinical Lipidology Attending Cardiologist  Direct Dial: [phone]  Fax: [phone]  Website:  [email or web address]  Pixie Casino, MD  12/27/2020, 11:06 AM

## 2020-12-27 NOTE — TOC Progression Note (Signed)
Transition of Care [facility]) - Progression Note    Patient Details  Name: Tommy Morris MRN: [id number] Date of Birth: April 29, 1940  [facility] CM/SW Contact  Leeroy Cha, RN Phone Number: 12/27/2020, 9:45 AM  Clinical Narrative:    Passar number is [phone]        Expected Discharge Plan and Services                                                 Social Determinants of Health (SDOH) Interventions    Readmission Risk Interventions No flowsheet data found.

## 2020-12-27 NOTE — Progress Notes (Signed)
PROGRESS NOTE  DAMARRI RAMPY Morris:[id number] DOB: 11-17-1940 DOA: 12/22/2020 PCP: Axel Filler, MD   LOS: 5 days   Brief narrative:  Tommy Morris is a 80 y.o. male with medical history significant for remote history of bladder cancer s/p transurethral resection in 0354, chronic systolic heart failure, atrial fibrillation and hypertension presented to the hospital with generalized weakness.  Patient lives by himself at home and family had checked on him.  For the last 3 weeks, patient has been complaining of dizziness and difficulty with ambulation with decreased oral intake.  Patient does have history of alcohol consumption.  In the ED, patient was noted to have temperature of 99.2 F.  Patient did not have any leukocytosis.  Potassium was slightly elevated at 5.7.  Creatinine 1.6 from prior 0.9.  AST was elevated at 93 ALT of 42.  Total bilirubin of 4.4.  CK level was 919.  TSH of 5.8.  Chest x-ray showed a concerning for pericardial effusion.  Troponin was elevated at 40.  Bedside ultrasound was obtained in the ED which showed pericardial effusion.  Cardiology was consulted and patient was admitted to hospital with further evaluation and treatment.  Assessment/Plan:  Principal Problem:   Pericardial effusion Active Problems:   Anemia   Alcohol use disorder   Essential hypertension   Atrial fibrillation (HCC)   Chronic systolic heart failure (HCC)   Elevated bilirubin   AKI (acute kidney injury) ([place])  Generalized weakness, failure to thrive.  Seen by dietary services. Physical therapy has seen the patient and recommend skilled nursing facility placement at this time.  Risk for refeeding syndrome.  Continue thiamine, folic acid.    Poor output and oral intake.  Patient has been started on D5 normal saline 1 day since yesterday due to diminished oral intake and poor  output.  Encourage oral intake.   Pericardial effusion  Could be secondary to CHF.  No evidence of hemodynamic  instability.  Flu and COVID was negative. TSH elevated at 5.85 but free T4 within normal range.  EKG with atrial fibrillation with T wave inversion.  2D echocardiogram shows reduced LV function at 35 to 40% with global left ventricular hypokinesis and diastolic dysfunction.  Cardiology on board.  Received IV dose of Lasix during hospitalization currently not on any diuretic.  Moderate to severe mitral regurgitation. Cardiology following.  Poor candidate for any surgical intervention.  Hyperbilirubinemia/Elevated liver enzymes Likely secondary to ongoing alcohol usage.  Right upper quadrant ultrasound showing cirrhotic liver with ascites.  Latest AST elevated at 91 with bilirubin of 3.1.  INR of 1.4 at this time.  AKI with Elevated CK Creatinine levels were elevated at 1.6 compared to baseline 0.9.  Initially received IV fluids.  Creatinine of 1.3 today.  We will continue to monitor.  Hyperkalemia followed by hypokalemia Improved with p.o. potassium.  Latest potassium of 4.2.  Magnesium 1.9.  Essential hypertension  On amlodipine and metoprolol  at home.  Currently on Coreg and blood pressure is okay.    Anemia-normocytic Iron panel with low iron at 18 and saturation was low as well.  Vitamin B12 elevated.  Folic acid within normal range.  Latest hemoglobin of 10.4   Alcohol use , indistinct speech possible mild metabolic encephalopathy.. Possibility of underlying dementia from alcohol or Alzheimer's with possible encephalopathy from medication, could not rule out component of Wernicke's encephalopathy.  on high-dose IV thiamine 500 mg IV 3 times daily for 2 days, followed by 250 mg IV  daily for next 5 days.  Continue, multivitamin and folic acid.  On as needed Ativan for agitation.  During palliative care consultation for goals of care discussion.  Ammonia level of 29 and within normal range.  Is on lactulose for adequate bowel movements.  We will get CT scan of the head for baseline  evaluation.  Chronic systolic HF  Likely nonischemic from alcohol hypertension A. fib.   on Coreg.  Cardiology on board.  Might ideally be on hydralazine and nitroglycerin which can be initiated as outpatient as per cardiology.  Atrial fibrillation Has declined anticoagulation in the past.  Has been started on Coreg.  Heart rate is controlled.  Ethics/goals of care.  Patient with multiple comorbidities at advanced age with ongoing alcohol use and encephalopathy.  Appears to have overall poor to limited prognosis.  Pending palliative care consultation for discussion about goals of care.  Patient's daughter feels like patient likely has underlying baseline dementia.  DVT prophylaxis: enoxaparin (LOVENOX) injection 40 mg Start: 12/22/20 2215  Code Status: Full code  Family Communication:   Spoke with the patient's daughter on the phone on on 12/26/2020 and updated her about the clinical condition of the patient.    Status is: Inpatient  Remains inpatient appropriate because: generalized weakness, related to thrive, prolonged alcohol use disorder, pericardial effusion, deconditioning, encephalopathy, need for skilled nursing facility placement.  Consultants: Cardiology Palliative care-pending  Procedures: None  Anti-infectives:  None  Anti-infectives (From admission, onward)    None      Subjective:  Today, patient was seen and examined at bedside.  Patient has indistinct speech.  Says okay.  Denies pain nausea vomiting.   Objective: Vitals:   12/27/20 0605 12/27/20 0822  BP: 103/84 102/76  Pulse: 84 73  Resp: 16   Temp: 98.5 F (36.9 C)   SpO2: 100%     Intake/Output Summary (Last 24 hours) at 12/27/2020 1030 Last data filed at 12/27/2020 0500 Gross per 24 hour  Intake 749.13 ml  Output 200 ml  Net 549.13 ml    Filed Weights   12/22/20 1807  Weight: 69 kg   Body mass index is 20.07 kg/m.   Physical Exam: General: Thinly built, not in obvious distress,  indistinct speech, answering few questions, HENT:   No scleral pallor or icterus noted. Oral mucosa is moist.  Chest:   Diminished breath sounds bilaterally. CVS: S1 &S2 heard.  Murmur noted, irregular rhythm.  Abdomen: Soft, nontender, nondistended.  Bowel sounds are heard.   Extremities: No cyanosis, clubbing or edema.  Peripheral pulses are palpable. Psych: Alert, awake and oriented to self, indistinctly speech, moving all extremities CNS: Indistinct speech.  Moves all extremities.    Skin: Warm and dry.  Thick scaly skin.  Data Review: I have personally reviewed the following laboratory data and studies,  CBC: Recent Labs  Lab 12/22/20 2009 12/24/20 0430 12/25/20 0400 12/26/20 0354 12/27/20 0406  WBC 5.5 5.5 5.2 4.6 3.8*  NEUTROABS 5.0  --   --   --   --   HGB 10.4* 10.4* 10.3* 10.1* 10.4*  HCT 32.2* 31.2* 31.4* 31.0* 32.0*  MCV 86.1 83.6 84.6 84.9 87.0  PLT 180 149* 118* 128* 117*    Basic Metabolic Panel: Recent Labs  Lab 12/23/20 0412 12/24/20 0430 12/25/20 0400 12/26/20 0354 12/27/20 0406  NA 144 142 140 141 143  K 3.0* 3.0* 3.4* 4.2 4.2  CL 109 109 108 112* 110  CO2 22 26 26 24  25  GLUCOSE 62* 94 99 109* 110*  BUN 33* 34* 28* 27* 28*  CREATININE 1.57* 1.34* 1.20 1.14 1.35*  CALCIUM 8.7* 8.5* 8.4* 8.3* 8.8*  MG  --  1.8 1.8 1.9 1.9  PHOS  --  3.3  --   --   --     Liver Function Tests: Recent Labs  Lab 12/22/20 2009 12/25/20 0400  AST 93* 91*  ALT 42 41  ALKPHOS 118 108  BILITOT 4.4* 3.1*  PROT 7.6 6.7  ALBUMIN 2.5* 2.2*    No results for input(s): LIPASE, AMYLASE in the last 168 hours. Recent Labs  Lab 12/26/20 1132  AMMONIA 29   Cardiac Enzymes: Recent Labs  Lab 12/22/20 2009  CKTOTAL 919*    BNP (last 3 results) Recent Labs    07/04/20 1032  BNP 558.9*     ProBNP (last 3 results) No results for input(s): PROBNP in the last 8760 hours.  CBG: No results for input(s): GLUCAP in the last 168 hours. Recent Results (from the  past 240 hour(s))  Urine Culture     Status: Abnormal   Collection Time: 12/22/20  6:17 PM   Specimen: Urine, Clean Catch  Result Value Ref Range Status   Specimen Description   Final    URINE, CLEAN CATCH Performed at [facility], [place] [address], [place], [place] [address]    Special Requests   Final    NONE Performed at [facility], [place] [address] [place], [place] [address]    Culture (A)  Final    <10,000 COLONIES/mL INSIGNIFICANT GROWTH Performed at [place] [address] [place], [place] [address]    Report Status 12/25/2020 FINAL  Final  Resp Panel by RT-PCR (Flu A&B, Covid) Nasopharyngeal Swab     Status: None   Collection Time: 12/22/20 10:17 PM   Specimen: Nasopharyngeal Swab; Nasopharyngeal(NP) swabs in vial transport medium  Result Value Ref Range Status   SARS Coronavirus 2 by RT PCR NEGATIVE NEGATIVE Final    Comment: (NOTE) SARS-CoV-2 target nucleic acids are NOT DETECTED.  The SARS-CoV-2 RNA is generally detectable in upper respiratory specimens during the acute phase of infection. The lowest concentration of SARS-CoV-2 viral copies this assay can detect is 138 copies/mL. A negative result does not preclude SARS-Cov-2 infection and should not be used as the sole basis for treatment or other patient management decisions. A negative result may occur with  improper specimen collection/handling, submission of specimen other than nasopharyngeal swab, presence of viral mutation(s) within the areas targeted by this assay, and inadequate number of viral copies(<138 copies/mL). A negative result must be combined with clinical observations, patient history, and epidemiological information. The expected result is Negative.  Fact Sheet for Patients:  EntrepreneurPulse.com.au  Fact Sheet for Healthcare Providers:  IncredibleEmployment.be  This test is no t yet approved or cleared by  the [place] FDA and  has been authorized for detection and/or diagnosis of SARS-CoV-2 by FDA under an Emergency Use Authorization (EUA). This EUA will remain  in effect (meaning this test can be used) for the duration of the COVID-19 declaration under Section 564(b)(1) of the Act, 21 U.S.C.section 360bbb-3(b)(1), unless the authorization is terminated  or revoked sooner.       Influenza A by PCR NEGATIVE NEGATIVE Final   Influenza B by PCR NEGATIVE NEGATIVE Final    Comment: (NOTE) The Xpert Xpress SARS-CoV-2/FLU/RSV plus assay is intended as an aid in the diagnosis of influenza from  Nasopharyngeal swab specimens and should not be used as a sole basis for treatment. Nasal washings and aspirates are unacceptable for Xpert Xpress SARS-CoV-2/FLU/RSV testing.  Fact Sheet for Patients: EntrepreneurPulse.com.au  Fact Sheet for Healthcare Providers: IncredibleEmployment.be  This test is not yet approved or cleared by the [place] FDA and has been authorized for detection and/or diagnosis of SARS-CoV-2 by FDA under an Emergency Use Authorization (EUA). This EUA will remain in effect (meaning this test can be used) for the duration of the COVID-19 declaration under Section 564(b)(1) of the Act, 21 U.S.C. section 360bbb-3(b)(1), unless the authorization is terminated or revoked.  Performed at [facility] [place] [address] [place], [place] [address]      Studies: No results found.   Flora Lipps, MD  [facility] 12/27/2020  If 7PM-7AM, please contact night-coverage

## 2020-12-28 ENCOUNTER — Inpatient Hospital Stay (HOSPITAL_COMMUNITY): Payer: Medicare Other

## 2020-12-28 DIAGNOSIS — I5022 Chronic systolic (congestive) heart failure: Secondary | ICD-10-CM | POA: Diagnosis not present

## 2020-12-28 DIAGNOSIS — Z7189 Other specified counseling: Secondary | ICD-10-CM | POA: Diagnosis not present

## 2020-12-28 DIAGNOSIS — Z515 Encounter for palliative care: Secondary | ICD-10-CM | POA: Diagnosis not present

## 2020-12-28 DIAGNOSIS — F1010 Alcohol abuse, uncomplicated: Secondary | ICD-10-CM | POA: Diagnosis not present

## 2020-12-28 DIAGNOSIS — N179 Acute kidney failure, unspecified: Secondary | ICD-10-CM | POA: Diagnosis not present

## 2020-12-28 DIAGNOSIS — R531 Weakness: Secondary | ICD-10-CM

## 2020-12-28 DIAGNOSIS — I3139 Other pericardial effusion (noninflammatory): Secondary | ICD-10-CM | POA: Diagnosis not present

## 2020-12-28 LAB — URINALYSIS, ROUTINE W REFLEX MICROSCOPIC
RBC / HPF: >50 RBC/hpf — ABNORMAL HIGH (ref 0–5)
WBC, UA: >50 WBC/hpf — ABNORMAL HIGH (ref 0–5)

## 2020-12-28 LAB — CBC
HCT: 30.6 % — ABNORMAL LOW (ref 39.0–52.0)
Hemoglobin: 9.9 g/dL — ABNORMAL LOW (ref 13.0–17.0)
MCH: 28.2 pg (ref 26.0–34.0)
MCHC: 32.4 g/dL (ref 30.0–36.0)
MCV: 87.2 fL (ref 80.0–100.0)
Platelets: 115 10*3/uL — ABNORMAL LOW (ref 150–400)
RBC: 3.51 MIL/uL — ABNORMAL LOW (ref 4.22–5.81)
RDW: 20.6 % — ABNORMAL HIGH (ref 11.5–15.5)
WBC: 4.4 10*3/uL (ref 4.0–10.5)
nRBC: 1.1 % — ABNORMAL HIGH (ref 0.0–0.2)

## 2020-12-28 LAB — MAGNESIUM: Magnesium: 2.2 mg/dL (ref 1.7–2.4)

## 2020-12-28 LAB — GLUCOSE, CAPILLARY: Glucose-Capillary: 121 mg/dL — ABNORMAL HIGH (ref 70–99)

## 2020-12-28 LAB — COMPREHENSIVE METABOLIC PANEL
ALT: 44 U/L (ref 0–44)
AST: 76 U/L — ABNORMAL HIGH (ref 15–41)
Albumin: 2.2 g/dL — ABNORMAL LOW (ref 3.5–5.0)
Alkaline Phosphatase: 127 U/L — ABNORMAL HIGH (ref 38–126)
Anion gap: 9 (ref 5–15)
BUN: 36 mg/dL — ABNORMAL HIGH (ref 8–23)
CO2: 24 mmol/L (ref 22–32)
Calcium: 8.9 mg/dL (ref 8.9–10.3)
Chloride: 109 mmol/L (ref 98–111)
Creatinine, Ser: 1.79 mg/dL — ABNORMAL HIGH (ref 0.61–1.24)
GFR, Estimated: 37 mL/min — ABNORMAL LOW (ref >60)
Glucose, Bld: 117 mg/dL — ABNORMAL HIGH (ref 70–99)
Potassium: 4.3 mmol/L (ref 3.5–5.1)
Sodium: 142 mmol/L (ref 135–145)
Total Bilirubin: 2.7 mg/dL — ABNORMAL HIGH (ref 0.3–1.2)
Total Protein: 7 g/dL (ref 6.5–8.1)

## 2020-12-28 LAB — MRSA NEXT GEN BY PCR, NASAL: MRSA by PCR Next Gen: NOT DETECTED

## 2020-12-28 MED ORDER — CHLORHEXIDINE GLUCONATE CLOTH 2 % EX PADS
6.0000 | MEDICATED_PAD | Freq: Every day | CUTANEOUS | Status: DC
  Administered 2020-12-28 – 2021-01-04 (×8): 6 via TOPICAL

## 2020-12-28 MED ORDER — FERROUS SULFATE 325 (65 FE) MG PO TABS
325.0000 mg | ORAL_TABLET | Freq: Every day | ORAL | Status: DC
  Administered 2020-12-29 – 2021-01-04 (×7): 325 mg via ORAL
  Filled 2020-12-28 (×7): qty 1

## 2020-12-28 MED ORDER — DEXTROSE-NACL 5-0.9 % IV SOLN: INTRAVENOUS | Status: DC

## 2020-12-28 MED ORDER — SODIUM CHLORIDE 0.9 % IV SOLN
1.0000 g | INTRAVENOUS | Status: DC
  Administered 2020-12-28 – 2020-12-29 (×2): 1 g via INTRAVENOUS
  Filled 2020-12-28 (×4): qty 10

## 2020-12-28 NOTE — TOC Progression Note (Signed)
Transition of Care [facility]) - Progression Note    Patient Details  Name: Tommy Morris MRN: [id number] Date of Birth: Sep 25, 1940  [facility] CM/SW Contact  Leeroy Cha, RN Phone Number: 12/28/2020, 10:01 AM  Clinical Narrative:    Tct-duaghter Maylene Roes like to try to place father in either [place] or [facility] will start auth.        Expected Discharge Plan and Services                                                 Social Determinants of Health (SDOH) Interventions    Readmission Risk Interventions No flowsheet data found.

## 2020-12-28 NOTE — Consult Note (Signed)
Consultation Note Date: 12/28/2020   Patient Name: Tommy Morris  DOB: 11-Jul-1940  MRN: [id number]  Age / Sex: 80 y.o., male  PCP: Axel Filler, MD Referring Physician: Alma Friendly, MD  Reason for Consultation: Establishing goals of care  HPI/Patient Profile: 80 y.o. male  admitted on 12/22/2020    Clinical Assessment and Goals of Care:  80 yo with history of bladder cancer s/p TURP, chronic systolic CHF, A fib and HTN, admitted with generalized weakness. Patient lives at home by himself, admitted to hospital medicine service with weakness, undergoing cardiology follow up, has pericardial effusion, moderate to severe MR, AKI. Has history of ETOH use.   A palliative consult has been requested for goals of care discussions. Patient is resting in bed, answers few questions appropriately, in no distress, denies pain.   Palliative medicine is specialized medical care for people living with serious illness. It focuses on providing relief from the symptoms and stress of a serious illness. The goal is to improve quality of life for both the patient and the family. Goals of care: Broad aims of medical therapy in relation to the patient's values and preferences. Our aim is to provide medical care aimed at enabling patients to achieve the goals that matter most to them, given the circumstances of their particular medical situation and their constraints.    NEXT OF KIN  Has son and daughter.   SUMMARY OF RECOMMENDATIONS   Full Code.   Recommend SNF rehab with palliative services following.   Code Status/Advance Care Planning: Full code   Symptom Management:     Palliative Prophylaxis:  Delirium Protocol  Additional Recommendations (Limitations, Scope, Preferences): Full Scope Treatment  Psycho-social/Spiritual:  Desire for further Chaplaincy support:yes Additional Recommendations:  Caregiving  Support/Resources  Prognosis:  Unable to determine  Discharge Planning: To Be Determined      Primary Diagnoses: Present on Admission:  Pericardial effusion  Alcohol use disorder  Atrial fibrillation (HCC)  Chronic systolic heart failure ([place])  Essential hypertension   I have reviewed the medical record, interviewed the patient and family, and examined the patient. The following aspects are pertinent.  Past Medical History:  Diagnosis Date   Atrial fibrillation ([place]) chronic    echo 05/03/2009 mildly to moderately reduced LV systolic function ef 06-00%  Patient declines anticoagulation for stroke prophylaxis.   BPH (benign prostatic hypertrophy) with urinary obstruction    Diverticulosis of colon    History of bladder cancer    2006  S/P TURBT  UROTHELIAL CARCINOMA   History of gout    History of urinary retention    LAST ISSUE 11/2012 W/ ARF   Hypertension    Mild mitral valve prolapse    Mitral valve regurgitation    OA (osteoarthritis) of knee    Pulmonary nodule    Two small right lung nodules on chest CT 04/25/2009   Social History   Socioeconomic History   Marital status: Widowed    Spouse name: Not on file   Number of  children: Not on file   Years of education: Not on file   Highest education level: Not on file  Occupational History   Not on file  Tobacco Use   Smoking status: Former    Packs/day: 0.20    Years: 50.00    Pack years: 10.00    Types: Cigarettes    Quit date: 01/22/2002    Years since quitting: 18.9   Smokeless tobacco: Former    Types: Chew    Quit date: 01/23/1964  Substance and Sexual Activity   Alcohol use: Yes    Alcohol/week: 21.0 standard drinks    Types: 21 Standard drinks or equivalent per week    Comment: Average of about 3 drinks per day.   Drug use: No   Sexual activity: Not on file  Other Topics Concern   Not on file  Social History Narrative   Not on file   Social Determinants of Health   Financial  Resource Strain: Not on file  Food Insecurity: Not on file  Transportation Needs: Not on file  Physical Activity: Not on file  Stress: Not on file  Social Connections: Not on file   Family History  Problem Relation Age of Onset   Alzheimer's disease Mother        Died in 67 at age 62.   Cancer Neg Hx    Diabetes Neg Hx    Hypertension Neg Hx    Heart disease Neg Hx    Scheduled Meds:  aspirin EC  81 mg Oral Daily   carvedilol  6.25 mg Oral BID WC   enoxaparin (LOVENOX) injection  40 mg Subcutaneous Q24H   feeding supplement  237 mL Oral TID BM   folic acid  1 mg Oral Daily   lactulose  10 g Oral BID   multivitamin with minerals  1 tablet Oral Daily   Continuous Infusions:  dextrose 5 % and 0.9% NaCl 100 mL/hr at 12/28/20 1308   thiamine injection 250 mg (12/28/20 1236)   PRN Meds:.hydrALAZINE, LORazepam Medications Prior to Admission:  Prior to Admission medications   Medication Sig Start Date End Date Taking? Authorizing Provider  amLODipine (NORVASC) 5 MG tablet Take 1 tablet (5 mg total) by mouth daily. 10/14/20  Yes Axel Filler, MD  aspirin 81 MG EC tablet Take 1 tablet (81 mg total) by mouth daily. 09/14/19  Yes Masoudi, Elhamalsadat, MD  furosemide (LASIX) 20 MG tablet Take 1 tablet (20 mg total) by mouth 2 (two) times daily. 08/17/20 08/17/21 Yes Angelica Pou, MD  metoprolol succinate (TOPROL-XL) 25 MG 24 hr tablet TAKE 1 TABLET(25 MG) BY MOUTH DAILY Patient taking differently: 25 mg daily. 05/13/20  Yes Axel Filler, MD  Skin Protectants, Misc. (EUCERIN) cream Apply topically as needed for dry skin. Patient taking differently: Apply 1 application topically daily as needed for dry skin. 02/05/17  Yes Axel Filler, MD  triamcinolone ointment (KENALOG) 0.1 % Apply topically daily. Patient not taking: Reported on 12/23/2020 03/16/19   Axel Filler, MD   No Known Allergies Review of Systems Not in distress  Physical  Exam Awakens easily Not in distress Verbalizes some, which I am not able to understand Irregular Abdomen not distended Dry skin Regular work of breathing.   Vital Signs: BP 100/68 (BP Location: Right Arm)   Pulse 62   Temp (!) 95.9 F (35.5 C) (Rectal)   Resp 13   Ht 6\' 1"  (1.854 m)   Wt 69 kg  SpO2 100%   BMI 20.07 kg/m  Pain Scale: 0-10   Pain Score: 0-No pain   SpO2: SpO2: 100 % O2 Device:SpO2: 100 % O2 Flow Rate: .   IO: Intake/output summary:  Intake/Output Summary (Last 24 hours) at 12/28/2020 1555 Last data filed at 12/28/2020 1524 Gross per 24 hour  Intake 1047.17 ml  Output 700 ml  Net 347.17 ml    LBM: Last BM Date: 12/25/20 Baseline Weight: Weight: 69 kg Most recent weight: Weight: 69 kg     Palliative Assessment/Data:   PPS 50%  Time In:  1400 Time Out:  1500 Time Total:  60  Greater than 50%  of this time was spent counseling and coordinating care related to the above assessment and plan.  Signed by: Loistine Chance, MD   Please contact Palliative Medicine Team phone at [phone] for questions and concerns.  For individual provider: See Shea Evans

## 2020-12-28 NOTE — Progress Notes (Signed)
PROGRESS NOTE  Tommy Morris Tommy Morris:[id number] DOB: [birth year] DOA: 12/22/2020 PCP: Axel Filler, MD   LOS: 6 days   Brief narrative: Tommy Morris is a 80 y.o. male with medical history significant for remote history of bladder cancer s/p transurethral resection in 0254, chronic systolic heart failure, atrial fibrillation and hypertension presented to the hospital with generalized weakness.  Patient lives by himself at home and family had checked on him.  For the last 3 weeks, patient has been complaining of dizziness and difficulty with ambulation with decreased oral intake.  Patient does have history of alcohol consumption.  In the ED, patient was noted to have temperature of 99.2 F. Potassium was slightly elevated at 5.7.  Creatinine 1.6 from prior 0.9.  AST was elevated at 93 ALT of 42.  Total bilirubin of 4.4.  CK level was 919.  TSH of 5.8.  Chest x-ray showed a concerning for pericardial effusion.  Troponin was elevated at 40.  Bedside ultrasound was obtained in the ED which showed pericardial effusion.  Cardiology was consulted and patient was admitted to hospital with further evaluation and treatment.  Assessment/Plan:  Principal Problem:   Pericardial effusion Active Problems:   Anemia   Alcohol use disorder   Essential hypertension   Atrial fibrillation (HCC)   Chronic systolic heart failure (HCC)   Elevated bilirubin   AKI (acute kidney injury) ([place])   Generalized weakness/failure to thrive Physical therapy has seen the patient and recommend skilled nursing facility placement at this time.  Risk for refeeding syndrome.  Continue thiamine, folic acid.    Poor output and oral intake Continue IV fluids, increased rates due to poor urine output and AKI Diminished oral intake and poor output.  Encourage oral intake  Acute metabolic encephalopathy Hypothermia, ??sepsis Noted to be somewhat lethargic, but able to follow commands and answer simple questions CT head with  hypodensity in the pons and right cerebellar MRI brain no acute intracranial abnormality Patient noted to be persistently hypothermic, despite warm blankets and increasing temp in room, concern for possible ??sepsis Sepsis work-up ordered, UA turbid urine, many bacteria, greater than 50 WBC, UC pending, chest x-ray unremarkable for any infection.  BC x2 pending Start IV Rocephin IV fluids Transfer to stepdown unit for bear hugger  Pericardial effusion  Could be secondary to CHF.  No evidence of hemodynamic instability.  Flu and COVID was negative. TSH elevated at 5.85 but free T4 within normal range.  EKG with atrial fibrillation with T wave inversion.  2D echocardiogram shows reduced LV function at 35 to 40% with global left ventricular hypokinesis and diastolic dysfunction.  Cardiology on board.  Received IV dose of Lasix during hospitalization currently not on any diuretic.  Moderate to severe mitral regurgitation. Cardiology following.  Poor candidate for any surgical intervention.  Hyperbilirubinemia/Elevated liver enzymes Likely liver cirrhosis?  Decompensated Likely secondary to ongoing alcohol usage.  Right upper quadrant ultrasound showing cirrhotic liver with ascites.  Latest AST elevated at 91 with bilirubin of 3.1.  INR of 1.4 at this time Patient may require paracentesis to rule out any ??SBP due to liver cirrhosis with ascites  AKI with Elevated CK Creatinine levels were elevated at 1.6 compared to baseline 0.9 Continue IV fluids  Essential hypertension BP currently soft, hold home amlodipine, metoprolol and Coreg for now  Anemia-normocytic Iron panel with low iron at 18 and saturation was low as well.  Vitamin B12 elevated.  Folic acid within normal range Start oral iron  supplementation   Alcohol use , indistinct speech possible mild metabolic encephalopathy Possibility of underlying dementia from alcohol or Alzheimer's with possible encephalopathy from medication, could  not rule out component of Wernicke's encephalopathy Continue high-dose IV thiamine 500 mg IV 3 times daily for 2 days, followed by 250 mg IV daily for next 5 days Continue, multivitamin and folic acid Continue lactulose for adequate bowel movement  Chronic systolic HF  Likely nonischemic from alcohol hypertension  Cardiology on board.  Might ideally be on hydralazine and nitroglycerin which can be initiated as outpatient as per cardiology Hold Coreg for now due to soft BP  Atrial fibrillation Has declined anticoagulation in the past Heart rate is controlled Hold Coreg for now due to soft BP  Ethics/goals of care.  Patient with multiple comorbidities at advanced age with ongoing alcohol use and encephalopathy.  Appears to have overall poor to limited prognosis Palliative care consultation for discussion about goals of care-plan for SNF with palliative Patient's daughter feels like patient likely has underlying baseline dementia.  DVT prophylaxis: enoxaparin (LOVENOX) injection 40 mg Start: 12/22/20 2215  Code Status: Full code  Family Communication:  None at bedside  Status is: Inpatient  Remains inpatient appropriate because: generalized weakness, related to thrive, prolonged alcohol use disorder, pericardial effusion, deconditioning, encephalopathy, need for skilled nursing facility placement.  Consultants: Cardiology Palliative care  Procedures: None  Anti-infectives:  Rocephin  Anti-infectives (From admission, onward)    None      Subjective:  Saw patient earlier this morning, reports feeling good with no new complaints.  Shortly after patient was noted to be hypothermic and somewhat lethargic, but able to follow commands and answer simple questions  Objective: Vitals:   12/28/20 1521 12/28/20 1705  BP:  107/75  Pulse:  60  Resp:  18  Temp: (!) 95.9 F (35.5 C) (!) 94.1 F (34.5 C)  SpO2:      Intake/Output Summary (Last 24 hours) at 12/28/2020  1828 Last data filed at 12/28/2020 1524 Gross per 24 hour  Intake 1014.5 ml  Output 700 ml  Net 314.5 ml   Filed Weights   12/22/20 1807  Weight: 69 kg   Body mass index is 20.07 kg/m.   Physical Exam: General: Somewhat lethargic, but able to follow commands and answer simple questions.  Speech sometimes unclear Cardiovascular: S1, S2 present Respiratory: CTAB Abdomen: Soft, nontender, nondistended, bowel sounds present Musculoskeletal: No bilateral pedal edema noted Skin: Normal Psychiatry: Fair mood    Data Review: I have personally reviewed the following laboratory data and studies,  CBC: Recent Labs  Lab 12/22/20 2009 12/24/20 0430 12/25/20 0400 12/26/20 0354 12/27/20 0406 12/28/20 0356  WBC 5.5 5.5 5.2 4.6 3.8* 4.4  NEUTROABS 5.0  --   --   --   --   --   HGB 10.4* 10.4* 10.3* 10.1* 10.4* 9.9*  HCT 32.2* 31.2* 31.4* 31.0* 32.0* 30.6*  MCV 86.1 83.6 84.6 84.9 87.0 87.2  PLT 180 149* 118* 128* 117* 563*   Basic Metabolic Panel: Recent Labs  Lab 12/24/20 0430 12/25/20 0400 12/26/20 0354 12/27/20 0406 12/28/20 0356  NA 142 140 141 143 142  K 3.0* 3.4* 4.2 4.2 4.3  CL 109 108 112* 110 109  CO2 26 26 24 25 24   GLUCOSE 94 99 109* 110* 117*  BUN 34* 28* 27* 28* 36*  CREATININE 1.34* 1.20 1.14 1.35* 1.79*  CALCIUM 8.5* 8.4* 8.3* 8.8* 8.9  MG 1.8 1.8 1.9 1.9 2.2  PHOS  3.3  --   --   --   --    Liver Function Tests: Recent Labs  Lab 12/22/20 2009 12/25/20 0400 12/28/20 0356  AST 93* 91* 76*  ALT 42 41 44  ALKPHOS 118 108 127*  BILITOT 4.4* 3.1* 2.7*  PROT 7.6 6.7 7.0  ALBUMIN 2.5* 2.2* 2.2*   No results for input(s): LIPASE, AMYLASE in the last 168 hours. Recent Labs  Lab 12/26/20 1132  AMMONIA 29   Cardiac Enzymes: Recent Labs  Lab 12/22/20 2009  CKTOTAL 919*   BNP (last 3 results) Recent Labs    07/04/20 1032  BNP 558.9*    ProBNP (last 3 results) No results for input(s): PROBNP in the last 8760 hours.  CBG: Recent Labs   Lab 12/28/20 1312  GLUCAP 121*   Recent Results (from the past 240 hour(s))  Urine Culture     Status: Abnormal   Collection Time: 12/22/20  6:17 PM   Specimen: Urine, Clean Catch  Result Value Ref Range Status   Specimen Description   Final    URINE, CLEAN CATCH Performed at [facility], [place] [address] [place], [place] [address]    Special Requests   Final    NONE Performed at [facility], [place] [address] [place], [place] [address]    Culture (A)  Final    <10,000 COLONIES/mL INSIGNIFICANT GROWTH Performed at [place] [address], [place], [place] [address]    Report Status 12/25/2020 FINAL  Final  Resp Panel by RT-PCR (Flu A&B, Covid) Nasopharyngeal Swab     Status: None   Collection Time: 12/22/20 10:17 PM   Specimen: Nasopharyngeal Swab; Nasopharyngeal(NP) swabs in vial transport medium  Result Value Ref Range Status   SARS Coronavirus 2 by RT PCR NEGATIVE NEGATIVE Final    Comment: (NOTE) SARS-CoV-2 target nucleic acids are NOT DETECTED.  The SARS-CoV-2 RNA is generally detectable in upper respiratory specimens during the acute phase of infection. The lowest concentration of SARS-CoV-2 viral copies this assay can detect is 138 copies/mL. A negative result does not preclude SARS-Cov-2 infection and should not be used as the sole basis for treatment or other patient management decisions. A negative result may occur with  improper specimen collection/handling, submission of specimen other than nasopharyngeal swab, presence of viral mutation(s) within the areas targeted by this assay, and inadequate number of viral copies(<138 copies/mL). A negative result must be combined with clinical observations, patient history, and epidemiological information. The expected result is Negative.  Fact Sheet for Patients:  EntrepreneurPulse.com.au  Fact Sheet for Healthcare Providers:   IncredibleEmployment.be  This test is no t yet approved or cleared by the [place] FDA and  has been authorized for detection and/or diagnosis of SARS-CoV-2 by FDA under an Emergency Use Authorization (EUA). This EUA will remain  in effect (meaning this test can be used) for the duration of the COVID-19 declaration under Section 564(b)(1) of the Act, 21 U.S.C.section 360bbb-3(b)(1), unless the authorization is terminated  or revoked sooner.       Influenza A by PCR NEGATIVE NEGATIVE Final   Influenza B by PCR NEGATIVE NEGATIVE Final    Comment: (NOTE) The Xpert Xpress SARS-CoV-2/FLU/RSV plus assay is intended as an aid in the diagnosis of influenza from Nasopharyngeal swab specimens and should not be used as a sole basis for treatment. Nasal washings and aspirates are unacceptable for Xpert Xpress SARS-CoV-2/FLU/RSV testing.  Fact Sheet for Patients: EntrepreneurPulse.com.au  Fact  Sheet for Healthcare Providers: IncredibleEmployment.be  This test is not yet approved or cleared by the [place] and has been authorized for detection and/or diagnosis of SARS-CoV-2 by FDA under an Emergency Use Authorization (EUA). This EUA will remain in effect (meaning this test can be used) for the duration of the COVID-19 declaration under Section 564(b)(1) of the Act, 21 U.S.C. section 360bbb-3(b)(1), unless the authorization is terminated or revoked.  Performed at [facility], [place] [address], [place], [place] [address]      Studies: CT HEAD WO CONTRAST (5MM)  Result Date: 12/27/2020 CLINICAL DATA:  Mental status change EXAM: CT HEAD WITHOUT CONTRAST TECHNIQUE: Contiguous axial images were obtained from the base of the skull through the vertex without intravenous contrast. COMPARISON:  None. FINDINGS: Brain: Hypodensity in the pons and right cerebellar hemisphere (series 2, images 11 and 10). No acute  hemorrhage, mass, mass effect, or midline shift. Degree of central volume loss is likely at the upper limits of normal for age. Periventricular white matter changes, likely the sequela of chronic small vessel ischemic disease. No extra-axial collection or hydrocephalus. Vascular: No hyperdense vessel. Skull: No acute osseous abnormality. Sinuses/Orbits: Left phthisis bulbi. Normal right globe. The sinuses are unremarkable. Other: Trace fluid in the mastoid air cells. IMPRESSION: Hypodensity in the pons and right cerebellar hemisphere, of indeterminate acuity the. Consider MRI for further evaluation if clinically indicated. These results were called by telephone at the time of interpretation on 12/27/2020 at 3:48 pm to provider [facility] , who verbally acknowledged these results. Electronically Signed   By: Merilyn Baba M.D.   On: 12/27/2020 15:48   MR BRAIN WO CONTRAST  Result Date: 12/27/2020 CLINICAL DATA:  Delirium abnormal CT EXAM: MRI HEAD WITHOUT CONTRAST TECHNIQUE: Multiplanar, multiecho pulse sequences of the brain and surrounding structures were obtained without intravenous contrast. COMPARISON:  Same day CT head. FINDINGS: Mildly motion limited study. Brain: No acute infarction, hemorrhage, hydrocephalus, extra-axial collection or mass lesion. Moderate patchy T2/FLAIR hyperintensity within the supratentorial and pontine white matter, nonspecific but compatible with chronic microvascular ischemic disease. Moderate atrophy with ex vacuo ventricular dilation. Punctate foci of susceptibility artifact in the left cerebellum and right frontal lobe, probably chronic microhemorrhages. Vascular: Major arterial flow voids are maintained at the skull base. Skull and upper cervical spine: Normal marrow signal. Sinuses/Orbits: Clear sinuses.  Left phthisis bulbi. Other: Trace mastoid effusions. IMPRESSION: 1. No evidence of acute intracranial abnormality. 2. Moderate chronic microvascular ischemic disease and  atrophy. Electronically Signed   By: Margaretha Sheffield M.D.   On: 12/27/2020 18:14   DG Chest Port 1 View  Result Date: 12/28/2020 CLINICAL DATA:  Altered mental status. EXAM: PORTABLE CHEST 1 VIEW COMPARISON:  December 22, 2020. FINDINGS: Marked cardiomegaly is noted. Left lung is clear. Mild right basilar atelectasis is noted with small right pleural effusion. Bony thorax is unremarkable. IMPRESSION: Marked cardiomegaly. Mild right basilar atelectasis is noted with small right pleural effusion. Electronically Signed   By: Marijo Conception M.D.   On: 12/28/2020 14:59     Alma Friendly, MD  [facility] 12/28/2020  If 7PM-7AM, please contact night-coverage

## 2020-12-28 NOTE — Progress Notes (Deleted)
Physical Therapy Treatment Patient Details Name: WHITTEN ANDREONI MRN: [id number] DOB: 11-29-1940 Today's Date: 12/28/2020   History of Present Illness      PT Comments    Pt with significant decline in mobility since evaluation.  He was lethargic (noted received Ativan last night around 22:00, also ETOH use with met encephalopathy).  Pt requiring mod A of 2 for transfers and only able to ambulate 6'.  Pt with posterior lean requiring mod-max A.  Session focused on balance and transfers.  Updated recommendations to SNF.  Notified TOC and MD.    Recommendations for follow up therapy are one component of a multi-disciplinary discharge planning process, led by the attending physician.  Recommendations may be updated based on patient status, additional functional criteria and insurance authorization.  Follow Up Recommendations        Assistance Recommended at Discharge    Equipment Recommendations       Recommendations for Other Services       Precautions / Restrictions       Mobility  Bed Mobility                    Transfers                        Ambulation/Gait                   Stairs             Wheelchair Mobility    Modified Rankin (Stroke Patients Only)       Balance                                            Cognition                                                Exercises      General Comments        Pertinent Vitals/Pain      Home Living                          Prior Function            PT Goals (current goals can now be found in the care plan section)      Frequency           PT Plan      Co-evaluation              AM-PAC PT "6 Clicks" Mobility   Outcome Measure                   End of Session               Time:  -     Charges:                        Abran Richard, PT Acute Rehab Services Pager [phone] [facility] [phone]    Leeroy Cha 12/28/2020, 10:12 AM

## 2020-12-28 NOTE — Progress Notes (Signed)
Pt only has had 100 ml of urine output throughout nigh shift and IV fluids at 50 ml/hr running. Tommy Morris fit is in place and bedding is dry. Pt denies any discomfort. Provider notified. New orders received and carried for in/out cath with 400 ml emptied from bladder. 0 ml residual.

## 2020-12-28 NOTE — Progress Notes (Signed)
Pt transported to ICU from [facility].

## 2020-12-28 NOTE — Progress Notes (Signed)
Bladder scan revealed 100 cc.  No urine output since intermittent catheterization at 0415.  Warm blankets applied.  MD increased IVF to 100 mL/hr.  CBG 121.  Patient increasingly somnolent, but able to follow commands and answer simple questions.    12/28/20 1244  Assess: MEWS Score  Temp (!) 95.8 F (35.4 C)  BP 100/82  Pulse Rate 65  Level of Consciousness Alert  SpO2 100 %  O2 Device Room Air  Assess: MEWS Score  MEWS Temp 1  MEWS Systolic 1  MEWS Pulse 0  MEWS RR 1  MEWS LOC 0  MEWS Score 3  MEWS Score Color Yellow  Assess: if the MEWS score is Yellow or Red  Were vital signs taken at a resting state? Yes  Focused Assessment Change from prior assessment (see assessment flowsheet)  Treat  MEWS Interventions Escalated (See documentation below)  Take Vital Signs  Increase Vital Sign Frequency  Yellow: Q 2hr X 2 then Q 4hr X 2, if remains yellow, continue Q 4hrs  Escalate  MEWS: Escalate Yellow: discuss with charge nurse/RN and consider discussing with provider and RRT  Notify: Charge Nurse/RN  Name of Charge Nurse/RN Notified megan bullins, rn  Date Charge Nurse/RN Notified 12/28/20  Time Charge Nurse/RN Notified 1252  Notify: Provider  Provider Name/Title Horris Latino, MD  Date Provider Notified 12/28/20  Time Provider Notified 1307  Notification Type Page  Notification Reason Change in status  Provider response See new orders  Date of Provider Response 12/28/20  Time of Provider Response 1307  Assess: SIRS CRITERIA  SIRS Temperature  1  SIRS Pulse 0  SIRS Respirations  0  SIRS WBC 0  SIRS Score Sum  1

## 2020-12-28 NOTE — NC FL2 (Signed)
[place] LEVEL OF CARE SCREENING TOOL     IDENTIFICATION  Patient Name: Tommy Morris Birthdate: 04-29-1940 Sex: male Admission Date (Current Location): 12/22/2020  [facility] and [place] Number:  Herbalist and Address:  [facility],  [place], [place]      Provider Number: [id number]  Attending Physician Name and Address:  Alma Friendly, MD  Relative Name and Phone Number:       Current Level of Care: Hospital Recommended Level of Care: [place] Prior Approval Number:    Date Approved/Denied:   PASRR Number: [id number]  Discharge Plan: SNF    Current Diagnoses: Patient Active Problem List   Diagnosis Date Noted   Pericardial effusion 12/22/2020   AKI (acute kidney injury) ([place]) 12/22/2020   Health care maintenance 09/14/2019   Elevated bilirubin 03/19/2016   Gout 07/25/2015   Blindness of left eye 07/25/2015   Dermatitis 07/25/2015   Counseling regarding advanced directives and goals of care 14/43/1540   Chronic systolic heart failure ([place]) 03/28/2015   Alcohol use disorder 05/04/2009   Atrial fibrillation ([place]) 04/27/2009   Anemia 04/24/2009   Malignant neoplasm of bladder ([place]) 02/01/2006   Essential hypertension 02/01/2006    Orientation RESPIRATION BLADDER Height & Weight     Self, Time, Situation, Place  Normal Continent Weight: 69 kg Height:  6\' 1"  (185.4 cm)  BEHAVIORAL SYMPTOMS/MOOD NEUROLOGICAL BOWEL NUTRITION STATUS      Continent Diet (regular)  AMBULATORY STATUS COMMUNICATION OF NEEDS Skin   Extensive Assist Verbally Normal                       Personal Care Assistance Level of Assistance  Bathing, Feeding, Dressing Bathing Assistance: Independent Feeding assistance: Independent Dressing Assistance: Independent     Functional Limitations Info  Sight, Hearing, Speech Sight Info: Adequate Hearing Info: Adequate Speech Info: Adequate    SPECIAL CARE  FACTORS FREQUENCY  PT (By licensed PT), OT (By licensed OT)     PT Frequency: 5 x weekly OT Frequency: 5 x weekly            Contractures Contractures Info: Not present    Additional Factors Info  Code Status Code Status Info: full             Current Medications (12/28/2020):  This is the current hospital active medication list Current Facility-Administered Medications  Medication Dose Route Frequency Provider Last Rate Last Admin   aspirin EC tablet 81 mg  81 mg Oral Daily Tu, Ching T, DO   81 mg at 12/26/20 1002   carvedilol (COREG) tablet 6.25 mg  6.25 mg Oral BID WC Dorris Carnes V, MD   6.25 mg at 12/27/20 1642   dextrose 5 %-0.9 % sodium chloride infusion   Intravenous Continuous Pokhrel, Laxman, MD 50 mL/hr at 12/27/20 1828 Restarted at 12/27/20 1828   enoxaparin (LOVENOX) injection 40 mg  40 mg Subcutaneous Q24H Tu, Ching T, DO   40 mg at 12/27/20 2208   feeding supplement (ENSURE ENLIVE / ENSURE PLUS) liquid 237 mL  237 mL Oral TID BM Pokhrel, Laxman, MD   237 mL at 08/67/61 9509   folic acid (FOLVITE) tablet 1 mg  1 mg Oral Daily Tu, Ching T, DO   1 mg at 12/26/20 1001   hydrALAZINE (APRESOLINE) injection 5 mg  5 mg Intravenous Q6H PRN Tu, Ching T, DO       lactulose ([place])  10 GM/15ML solution 10 g  10 g Oral BID Pokhrel, Laxman, MD   10 g at 12/27/20 2207   LORazepam (ATIVAN) tablet 1 mg  1 mg Oral Q4H PRN Pokhrel, Laxman, MD   1 mg at 12/25/20 2230   multivitamin with minerals tablet 1 tablet  1 tablet Oral Daily Tu, Ching T, DO   1 tablet at 12/27/20 0092   thiamine (B-1) 250 mg in sodium chloride 0.9 % 50 mL IVPB  250 mg Intravenous Daily Minda Ditto, [facility] [address]         Discharge Medications: Please see discharge summary for a list of discharge medications.  Relevant Imaging Results:  Relevant Lab Results:   Additional Information ZRA:[id number]  Leeroy Cha, RN

## 2020-12-28 NOTE — Progress Notes (Signed)
BP 95/75, checked manually.  HR 75.  Carvedilol held per MD order.  Will monitor BP.  Angie Fava, RN

## 2020-12-29 ENCOUNTER — Inpatient Hospital Stay (HOSPITAL_COMMUNITY): Payer: Medicare Other

## 2020-12-29 DIAGNOSIS — R531 Weakness: Secondary | ICD-10-CM | POA: Diagnosis not present

## 2020-12-29 DIAGNOSIS — F1010 Alcohol abuse, uncomplicated: Secondary | ICD-10-CM | POA: Diagnosis not present

## 2020-12-29 DIAGNOSIS — Z515 Encounter for palliative care: Secondary | ICD-10-CM | POA: Diagnosis not present

## 2020-12-29 DIAGNOSIS — I3139 Other pericardial effusion (noninflammatory): Secondary | ICD-10-CM | POA: Diagnosis not present

## 2020-12-29 DIAGNOSIS — N179 Acute kidney failure, unspecified: Secondary | ICD-10-CM | POA: Diagnosis not present

## 2020-12-29 DIAGNOSIS — I5022 Chronic systolic (congestive) heart failure: Secondary | ICD-10-CM | POA: Diagnosis not present

## 2020-12-29 LAB — CBC WITH DIFFERENTIAL/PLATELET
Abs Immature Granulocytes: 0.02 10*3/uL (ref 0.00–0.07)
Basophils Absolute: 0 10*3/uL (ref 0.0–0.1)
Basophils Relative: 0 %
Eosinophils Absolute: 0.1 10*3/uL (ref 0.0–0.5)
Eosinophils Relative: 1 %
HCT: 28.4 % — ABNORMAL LOW (ref 39.0–52.0)
Hemoglobin: 9 g/dL — ABNORMAL LOW (ref 13.0–17.0)
Immature Granulocytes: 0 %
Lymphocytes Relative: 6 %
Lymphs Abs: 0.3 10*3/uL — ABNORMAL LOW (ref 0.7–4.0)
MCH: 27.4 pg (ref 26.0–34.0)
MCHC: 31.7 g/dL (ref 30.0–36.0)
MCV: 86.6 fL (ref 80.0–100.0)
Monocytes Absolute: 0.2 10*3/uL (ref 0.1–1.0)
Monocytes Relative: 4 %
Neutro Abs: 4.4 10*3/uL (ref 1.7–7.7)
Neutrophils Relative %: 89 %
Platelets: 99 10*3/uL — ABNORMAL LOW (ref 150–400)
RBC: 3.28 MIL/uL — ABNORMAL LOW (ref 4.22–5.81)
RDW: 20.6 % — ABNORMAL HIGH (ref 11.5–15.5)
WBC: 5 10*3/uL (ref 4.0–10.5)
nRBC: 1.2 % — ABNORMAL HIGH (ref 0.0–0.2)

## 2020-12-29 LAB — COMPREHENSIVE METABOLIC PANEL
ALT: 43 U/L (ref 0–44)
AST: 69 U/L — ABNORMAL HIGH (ref 15–41)
Albumin: 2.1 g/dL — ABNORMAL LOW (ref 3.5–5.0)
Alkaline Phosphatase: 136 U/L — ABNORMAL HIGH (ref 38–126)
Anion gap: 8 (ref 5–15)
BUN: 39 mg/dL — ABNORMAL HIGH (ref 8–23)
CO2: 23 mmol/L (ref 22–32)
Calcium: 8.9 mg/dL (ref 8.9–10.3)
Chloride: 112 mmol/L — ABNORMAL HIGH (ref 98–111)
Creatinine, Ser: 1.85 mg/dL — ABNORMAL HIGH (ref 0.61–1.24)
GFR, Estimated: 36 mL/min — ABNORMAL LOW (ref >60)
Glucose, Bld: 91 mg/dL (ref 70–99)
Potassium: 4.1 mmol/L (ref 3.5–5.1)
Sodium: 143 mmol/L (ref 135–145)
Total Bilirubin: 2.2 mg/dL — ABNORMAL HIGH (ref 0.3–1.2)
Total Protein: 6.7 g/dL (ref 6.5–8.1)

## 2020-12-29 LAB — GLUCOSE, CAPILLARY: Glucose-Capillary: 85 mg/dL (ref 70–99)

## 2020-12-29 LAB — PROTIME-INR
INR: 1.4 — ABNORMAL HIGH (ref 0.8–1.2)
Prothrombin Time: 17.6 seconds — ABNORMAL HIGH (ref 11.4–15.2)

## 2020-12-29 LAB — AMMONIA: Ammonia: 34 umol/L (ref 9–35)

## 2020-12-29 MED ORDER — ORAL CARE MOUTH RINSE
15.0000 mL | Freq: Two times a day (BID) | OROMUCOSAL | Status: DC
  Administered 2020-12-29 – 2021-01-04 (×13): 15 mL via OROMUCOSAL

## 2020-12-29 MED ORDER — LIDOCAINE HCL 1 % IJ SOLN
INTRAMUSCULAR | Status: AC
  Filled 2020-12-29: qty 20

## 2020-12-29 NOTE — Progress Notes (Signed)
Physical Therapy Treatment Patient Details Name: Tommy Morris MRN: [id number] DOB: 05-26-1940 Today's Date: 12/29/2020   History of Present Illness Pt is 80 yo male admitted on 12/22/20 with pericardial effusion, afib,, AKI, hyperkalemia, ETOH use, Metabolic encephalopathy.  Pt with hx of  bladder cancer s/p transurethral resection in 8657, chronic systolic heart failure, atrial fibrillation and hypertension, and ETOH use    PT Comments    Patient self limiting in participation with PT,  resistive to attempts for exercises. Frequently stated" I want to go to my  room.'. continue PT efforts.   Recommendations for follow up therapy are one component of a multi-disciplinary discharge planning process, led by the attending physician.  Recommendations may be updated based on patient status, additional functional criteria and insurance authorization.  Follow Up Recommendations  Skilled nursing-short term rehab (<3 hours/day)     Assistance Recommended at Discharge Frequent or constant Supervision/Assistance  Equipment Recommendations  None recommended by PT    Recommendations for Other Services       Precautions / Restrictions Precautions Precautions: Fall     Mobility  Bed Mobility Overal bed mobility: Needs Assistance             General bed mobility comments: Attempted multiple efforts to have patient participate in exercises, to sit on the bed edge. patient  declined, asking "why?"    Transfers                        Ambulation/Gait                   Stairs             Wheelchair Mobility    Modified Rankin (Stroke Patients Only)       Balance                                            Cognition Arousal/Alertness: Awake/alert Behavior During Therapy: WFL for tasks assessed/performed Overall Cognitive Status: No family/caregiver present to determine baseline cognitive functioning Area of Impairment:  Orientation;Attention;Following commands;Safety/judgement;Awareness;Problem solving                 Orientation Level: Disoriented to;Place;Time;Situation Current Attention Level: Sustained           General Comments: patient repeated" I want to go to my room.". Patient limited  in participation        Exercises      General Comments        Pertinent Vitals/Pain Pain Assessment: No/denies pain    Home Living                          Prior Function            PT Goals (current goals can now be found in the care plan section) Progress towards PT goals: Progressing toward goals    Frequency    Min 2X/week      PT Plan Current plan remains appropriate;Frequency needs to be updated    Co-evaluation              AM-PAC PT "6 Clicks" Mobility   Outcome Measure  Help needed turning from your back to your side while in a flat bed without using bedrails?: A Lot Help needed moving from lying on your back to  sitting on the side of a flat bed without using bedrails?: Total Help needed moving to and from a bed to a chair (including a wheelchair)?: Total Help needed standing up from a chair using your arms (e.g., wheelchair or bedside chair)?: Total Help needed to walk in hospital room?: Total Help needed climbing 3-5 steps with a railing? : Total 6 Click Score: 7    End of Session   Activity Tolerance:  (patient self limiting.) Patient left: in bed;with call bell/phone within reach;with bed alarm set Nurse Communication: Mobility status;Other (comment) PT Visit Diagnosis: Other abnormalities of gait and mobility (R26.89);Muscle weakness (generalized) (M62.81)     Time: 2244-9753 PT Time Calculation (min) (ACUTE ONLY): 15 min  Charges:  $Therapeutic Activity: 8-22 mins                     Tresa Endo PT Acute Rehabilitation Services Pager [phone] Office [phone]    Tommy Morris 12/29/2020, 4:44 PM

## 2020-12-29 NOTE — Progress Notes (Signed)
Patient presented to radiology for paracentesis today.   Abdomen visualized with ultrasound.  No fluid collection for safe approach found.  Ultrasound images saved for documentation purpose.   Paracentesis was NOT performed.   Armando Gang Jonice Cerra PA-C 12/29/2020 4:50 PM

## 2020-12-29 NOTE — Progress Notes (Signed)
Daily Progress Note   Patient Name: Tommy Morris       Date: 12/29/2020 DOB: Nov 19, 1940  Age: 80 y.o. MRN#: [id number] Attending Physician: Alma Friendly, MD Primary Care Physician: Axel Filler, MD Admit Date: 12/22/2020  Reason for Consultation/Follow-up: Establishing goals of care  Subjective: Patient is awake alert resting in bed.  He opens his eyes and is able to answer questions appropriately.  He denies any pain or shortness of breath.  Call placed and discussed with daughter, see below.  Length of Stay: 7  Current Medications: Scheduled Meds:  . aspirin EC  81 mg Oral Daily  . Chlorhexidine Gluconate Cloth  6 each Topical Daily  . enoxaparin (LOVENOX) injection  40 mg Subcutaneous Q24H  . feeding supplement  237 mL Oral TID BM  . ferrous sulfate  325 mg Oral Q breakfast  . folic acid  1 mg Oral Daily  . lactulose  10 g Oral BID  . mouth rinse  15 mL Mouth Rinse BID  . multivitamin with minerals  1 tablet Oral Daily    Continuous Infusions: . cefTRIAXone (ROCEPHIN)  IV Stopped (12/28/20 2314)  . thiamine injection Stopped (12/29/20 1108)    PRN Meds: hydrALAZINE  Physical Exam         Awake alert resting in bed Answers a few questions Does not appear to be in distress Has dry skin S1-S2 Monitor noted Regular work of breathing Does not have edema Appears chronically ill and with generalized weakness  Vital Signs: BP (!) 116/94   Pulse (!) 37   Temp (!) 97.5 F (36.4 C) (Axillary)   Resp (!) 27   Ht 6\' 1"  (1.854 m)   Wt 69 kg   SpO2 99%   BMI 20.07 kg/m  SpO2: SpO2: 99 % O2 Device: O2 Device: Room Air O2 Flow Rate:    Intake/output summary:  Intake/Output Summary (Last 24 hours) at 12/29/2020 1336 Last data filed at 12/29/2020  1039 Gross per 24 hour  Intake 1415.33 ml  Output 600 ml  Net 815.33 ml   LBM: Last BM Date: 12/25/20 Baseline Weight: Weight: 69 kg Most recent weight: Weight: 69 kg       Palliative Assessment/Data:      Patient Active Problem List   Diagnosis Date Noted  . Pericardial  effusion 12/22/2020  . AKI (acute kidney injury) ([place]) 12/22/2020  . Health care maintenance 09/14/2019  . Elevated bilirubin 03/19/2016  . Gout 07/25/2015  . Blindness of left eye 07/25/2015  . Dermatitis 07/25/2015  . Counseling regarding advanced directives and goals of care 03/28/2015  . Chronic systolic heart failure ([place]) 03/28/2015  . Alcohol use disorder 05/04/2009  . Atrial fibrillation ([place]) 04/27/2009  . Anemia 04/24/2009  . Malignant neoplasm of bladder ([place]) 02/01/2006  . Essential hypertension 02/01/2006    Palliative Care Assessment & Plan   Patient Profile:    Assessment: 80 year old gentleman who lives by himself here in [place], [place].  Son and daughter live nearby.  He has a history of bladder cancer status post TURP, chronic systolic heart failure atrial fibrillation and hypertension.  Patient admitted with dizziness decreased oral intake, history of EtOH use.  Hospital course complicated by acute metabolic encephalopathy and hypothermia, currently under work-up for sepsis, possible infection.  Cardiology was also following because of moderate to severe mitral regurgitation and pericardial effusion.  Hospital course also complicated by hyperbilirubinemia and elevated liver enzymes, concern for decompensated liver cirrhosis. Palliative medicine team following for CODE STATUS and goals of care discussions. Recommendations/Plan: Patient is able to answer a few questions appropriately, not able to fully participate in broad goals of care discussions.  Call placed and was able to reach daughter on the phone.  She stated that the patient's wife is deceased, patient has several  children who live in the area, lives by himself.  Discussed about acute issues pertaining to this hospitalization.  CODE STATUS and goals of care discussions undertaken: Full code/full scope care for now. Continue current mode of care Patient's daughter is aware of acute and chronic serious conditions that the patient is facing.  She is thankful for information she is receiving from hospital medicine doctors as well as from the hospital in general.  Continue current mode of care.   Code Status:    Code Status Orders  (From admission, onward)           Start     Ordered   12/22/20 2212  Full code  Continuous        12/22/20 2211           Code Status History     Date Active Date Inactive Code Status Order ID Comments User Context   01/06/2013 1607 01/07/2013 1412 Full Code [id number]  Irine Seal, MD Inpatient   12/10/2012 1251 12/11/2012 1835 Full Code [id number]  Charlann Lange, MD Inpatient       Prognosis:  Unable to determine  Discharge Planning: [place] for rehab with Palliative care service follow-up  Care plan was discussed with  patient, daughter on the phone.   Thank you for allowing the Palliative Medicine Team to assist in the care of this patient.   Time In: 1300 Time Out: 1335 Total Time 35 Prolonged Time Billed  no       Greater than 50%  of this time was spent counseling and coordinating care related to the above assessment and plan.  Loistine Chance, MD  Please contact Palliative Medicine Team phone at [phone] for questions and concerns.

## 2020-12-29 NOTE — Progress Notes (Signed)
   Appreciate palliative care recs - plans for SNF/palliative noted.  Would continue cardiac medications. No further suggestions at this time.  [facility] will sign off.   Medication Recommendations:  continue current meds Other recommendations (labs, testing, etc):  none Follow up as an outpatient:  PRN  Pixie Casino, MD, [facility], [place] Director of the Advanced Lipid Disorders &  Cardiovascular Risk Reduction Clinic Diplomate of the American Board of Clinical Lipidology Attending Cardiologist  Direct Dial: [phone]  Fax: [phone]  Website:  [email or web address]

## 2020-12-29 NOTE — Progress Notes (Signed)
PROGRESS NOTE  Tommy Morris CWC:[id number] DOB: [birth year] DOA: 12/22/2020 PCP: Axel Filler, MD   LOS: 7 days   Brief narrative: Tommy Morris is a 80 y.o. male with medical history significant for remote history of bladder cancer s/p transurethral resection in 7616, chronic systolic heart failure, atrial fibrillation and hypertension presented to the hospital with generalized weakness.  Patient lives by himself at home and family had checked on him.  For the last 3 weeks, patient has been complaining of dizziness and difficulty with ambulation with decreased oral intake.  Patient does have history of alcohol consumption.  In the ED, patient was noted to have temperature of 99.2 F. Potassium was slightly elevated at 5.7.  Creatinine 1.6 from prior 0.9.  AST was elevated at 93 ALT of 42.  Total bilirubin of 4.4.  CK level was 919.  TSH of 5.8.  Chest x-ray showed a concerning for pericardial effusion.  Troponin was elevated at 40.  Bedside ultrasound was obtained in the ED which showed pericardial effusion.  Cardiology was consulted and patient was admitted to hospital with further evaluation and treatment.   Assessment/Plan:  Principal Problem:   Pericardial effusion Active Problems:   Anemia   Alcohol use disorder   Essential hypertension   Atrial fibrillation (HCC)   Chronic systolic heart failure (HCC)   Elevated bilirubin   AKI (acute kidney injury) ([place])   Acute metabolic encephalopathy- More awake Hypothermia- improving ??sepsis Noted to be somewhat lethargic on 12/28/20, but able to follow commands and answer simple questions CT head with hypodensity in the pons and right cerebellar MRI brain no acute intracranial abnormality Patient noted to be persistently hypothermic, despite warm blankets and increasing temp in room, concern for possible ??sepsis, transferred to SDU Sepsis work-up ordered, UA turbid urine, many bacteria, greater than 50 WBC, UC pending, chest  x-ray unremarkable for any infection.  BC x2 pending Continue IV Rocephin D/C IV fluids Monitor closely  Hyperbilirubinemia/Elevated liver enzymes Likely liver cirrhosis Likely secondary to ongoing alcohol usage Right upper quadrant ultrasound showing cirrhotic liver with ascites (ascites noted to be minimal, not enough to drain) Daily CMP  AKI with Elevated CK Creatinine levels were elevated at 1.6 compared to baseline 0.9 S/P IV fluids Daily CMP  Generalized weakness/failure to thrive Physical therapy has seen the patient and recommend skilled nursing facility placement at this time.  Risk for refeeding syndrome.  Continue thiamine, folic acid.    Poor output and oral intake Diminished oral intake and poor output.  Encourage oral intake  Pericardial effusion  Could be secondary to CHF.  No evidence of hemodynamic instability.  Flu and COVID was negative. TSH elevated at 5.85 but free T4 within normal range.  EKG with atrial fibrillation with T wave inversion.  2D echocardiogram shows reduced LV function at 35 to 40% with global left ventricular hypokinesis and diastolic dysfunction.  Cardiology on board.  Received IV dose of Lasix during hospitalization currently not on any diuretic as Cr rising   Moderate to severe mitral regurgitation Cardiology following.  Poor candidate for any surgical intervention  Essential hypertension BP currently soft, hold home amlodipine, metoprolol and Coreg for now  Anemia-normocytic Iron panel with low iron at 18 and saturation was low as well.  Vitamin B12 elevated.  Folic acid within normal range Oral iron supplementation   Alcohol use , indistinct speech possible mild metabolic encephalopathy Possibility of underlying dementia from alcohol or Alzheimer's with possible encephalopathy from medication, could  not rule out component of Wernicke's encephalopathy Continue high-dose IV thiamine 500 mg IV 3 times daily for 2 days, followed by 250 mg IV  daily for next 5 days Continue, multivitamin and folic acid Continue lactulose for adequate bowel movement  Chronic systolic HF  Likely nonischemic from alcohol hypertension  Cardiology on board.  Might ideally be on hydralazine and nitroglycerin which can be initiated as outpatient as per cardiology Hold Coreg for now due to soft BP  Atrial fibrillation Has declined anticoagulation in the past Heart rate is controlled Hold Coreg for now due to soft BP  Ethics/goals of care.  Patient with multiple comorbidities at advanced age with ongoing alcohol use and encephalopathy.  Appears to have overall poor to limited prognosis Palliative care consultation for discussion about goals of care-plan for SNF with palliative Patient's daughter feels like patient likely has underlying baseline dementia.  DVT prophylaxis: enoxaparin (LOVENOX) injection 40 mg Start: 12/22/20 2215  Code Status: Full code  Family Communication:  None at bedside  Status is: Inpatient  Remains inpatient appropriate because: generalized weakness, related to thrive, prolonged alcohol use disorder, pericardial effusion, deconditioning, encephalopathy, need for skilled nursing facility placement.  Consultants: Cardiology Palliative care  Procedures: None  Anti-infectives:  Rocephin  Anti-infectives (From admission, onward)    Start     Dose/Rate Route Frequency Ordered Stop   12/28/20 1930  cefTRIAXone (ROCEPHIN) 1 g in sodium chloride 0.9 % 100 mL IVPB        1 g 200 mL/hr over 30 Minutes Intravenous Every 24 hours 12/28/20 1835        Subjective:  Pt noted to have improved temp of bear hugger. Noted to be more awake and alert this am. Answers some questions appropriately. Denies any new complaints. Appears comfortable  Objective: Vitals:   12/29/20 1500 12/29/20 1600  BP: 108/74 (!) 115/92  Pulse:    Resp: (!) 26 20  Temp:    SpO2:      Intake/Output Summary (Last 24 hours) at 12/29/2020  1619 Last data filed at 12/29/2020 1039 Gross per 24 hour  Intake 1415.33 ml  Output 400 ml  Net 1015.33 ml   Filed Weights   12/22/20 1807  Weight: 69 kg   Body mass index is 20.07 kg/m.     Physical Exam: General: NAD Cardiovascular: S1, S2 present Respiratory: CTAB Abdomen: Soft, nontender, nondistended, bowel sounds present Musculoskeletal: No bilateral pedal edema noted Skin: Normal Psychiatry: Fair mood    Data Review: I have personally reviewed the following laboratory data and studies,  CBC: Recent Labs  Lab 12/22/20 2009 12/24/20 0430 12/25/20 0400 12/26/20 0354 12/27/20 0406 12/28/20 0356 12/29/20 0251  WBC 5.5   < > 5.2 4.6 3.8* 4.4 5.0  NEUTROABS 5.0  --   --   --   --   --  4.4  HGB 10.4*   < > 10.3* 10.1* 10.4* 9.9* 9.0*  HCT 32.2*   < > 31.4* 31.0* 32.0* 30.6* 28.4*  MCV 86.1   < > 84.6 84.9 87.0 87.2 86.6  PLT 180   < > 118* 128* 117* 115* 99*   < > = values in this interval not displayed.   Basic Metabolic Panel: Recent Labs  Lab 12/24/20 0430 12/25/20 0400 12/26/20 0354 12/27/20 0406 12/28/20 0356 12/29/20 0251  NA 142 140 141 143 142 143  K 3.0* 3.4* 4.2 4.2 4.3 4.1  CL 109 108 112* 110 109 112*  CO2 26 26 24  25  24 23  GLUCOSE 94 99 109* 110* 117* 91  BUN 34* 28* 27* 28* 36* 39*  CREATININE 1.34* 1.20 1.14 1.35* 1.79* 1.85*  CALCIUM 8.5* 8.4* 8.3* 8.8* 8.9 8.9  MG 1.8 1.8 1.9 1.9 2.2  --   PHOS 3.3  --   --   --   --   --    Liver Function Tests: Recent Labs  Lab 12/22/20 2009 12/25/20 0400 12/28/20 0356 12/29/20 0251  AST 93* 91* 76* 69*  ALT 42 41 44 43  ALKPHOS 118 108 127* 136*  BILITOT 4.4* 3.1* 2.7* 2.2*  PROT 7.6 6.7 7.0 6.7  ALBUMIN 2.5* 2.2* 2.2* 2.1*   No results for input(s): LIPASE, AMYLASE in the last 168 hours. Recent Labs  Lab 12/26/20 1132  AMMONIA 29   Cardiac Enzymes: Recent Labs  Lab 12/22/20 2009  CKTOTAL 919*   BNP (last 3 results) Recent Labs    07/04/20 1032  BNP 558.9*     ProBNP (last 3 results) No results for input(s): PROBNP in the last 8760 hours.  CBG: Recent Labs  Lab 12/28/20 1312 12/29/20 0747  GLUCAP 121* 85   Recent Results (from the past 240 hour(s))  Urine Culture     Status: Abnormal   Collection Time: 12/22/20  6:17 PM   Specimen: Urine, Clean Catch  Result Value Ref Range Status   Specimen Description   Final    URINE, CLEAN CATCH Performed at [facility], [place] [address] [place], [place] [address]    Special Requests   Final    NONE Performed at [facility], [place] [address], [place], [place] [address]    Culture (A)  Final    <10,000 COLONIES/mL INSIGNIFICANT GROWTH Performed at [place] [address], [place], [place] [address]    Report Status 12/25/2020 FINAL  Final  Resp Panel by RT-PCR (Flu A&B, Covid) Nasopharyngeal Swab     Status: None   Collection Time: 12/22/20 10:17 PM   Specimen: Nasopharyngeal Swab; Nasopharyngeal(NP) swabs in vial transport medium  Result Value Ref Range Status   SARS Coronavirus 2 by RT PCR NEGATIVE NEGATIVE Final    Comment: (NOTE) SARS-CoV-2 target nucleic acids are NOT DETECTED.  The SARS-CoV-2 RNA is generally detectable in upper respiratory specimens during the acute phase of infection. The lowest concentration of SARS-CoV-2 viral copies this assay can detect is 138 copies/mL. A negative result does not preclude SARS-Cov-2 infection and should not be used as the sole basis for treatment or other patient management decisions. A negative result may occur with  improper specimen collection/handling, submission of specimen other than nasopharyngeal swab, presence of viral mutation(s) within the areas targeted by this assay, and inadequate number of viral copies(<138 copies/mL). A negative result must be combined with clinical observations, patient history, and epidemiological information. The expected result is Negative.  Fact Sheet  for Patients:  EntrepreneurPulse.com.au  Fact Sheet for Healthcare Providers:  IncredibleEmployment.be  This test is no t yet approved or cleared by the [place] FDA and  has been authorized for detection and/or diagnosis of SARS-CoV-2 by FDA under an Emergency Use Authorization (EUA). This EUA will remain  in effect (meaning this test can be used) for the duration of the COVID-19 declaration under Section 564(b)(1) of the Act, 21 U.S.C.section 360bbb-3(b)(1), unless the authorization is terminated  or revoked sooner.       Influenza A by PCR NEGATIVE NEGATIVE Final   Influenza B  by PCR NEGATIVE NEGATIVE Final    Comment: (NOTE) The Xpert Xpress SARS-CoV-2/FLU/RSV plus assay is intended as an aid in the diagnosis of influenza from Nasopharyngeal swab specimens and should not be used as a sole basis for treatment. Nasal washings and aspirates are unacceptable for Xpert Xpress SARS-CoV-2/FLU/RSV testing.  Fact Sheet for Patients: EntrepreneurPulse.com.au  Fact Sheet for Healthcare Providers: IncredibleEmployment.be  This test is not yet approved or cleared by the [place] FDA and has been authorized for detection and/or diagnosis of SARS-CoV-2 by FDA under an Emergency Use Authorization (EUA). This EUA will remain in effect (meaning this test can be used) for the duration of the COVID-19 declaration under Section 564(b)(1) of the Act, 21 U.S.C. section 360bbb-3(b)(1), unless the authorization is terminated or revoked.  Performed at [facility], [place] [address], [place], [place] [address]   Culture, blood (routine x 2)     Status: None (Preliminary result)   Collection Time: 12/28/20  3:06 PM   Specimen: BLOOD  Result Value Ref Range Status   Specimen Description   Final    BLOOD LEFT ANTECUBITAL Performed at [place] [address], [place],  [place] [address]    Special Requests   Final    BOTTLES DRAWN AEROBIC ONLY Blood Culture results may not be optimal due to an inadequate volume of blood received in culture bottles Performed at [place] [address] [place], [place] [address]    Culture   Final    NO GROWTH < 24 HOURS Performed at [place] [address], [place], [place] [address]    Report Status PENDING  Incomplete  Culture, blood (routine x 2)     Status: None (Preliminary result)   Collection Time: 12/28/20  3:06 PM   Specimen: BLOOD  Result Value Ref Range Status   Specimen Description   Final    BLOOD BLOOD LEFT WRIST Performed at [place] [address], [place], [place] [address]    Special Requests   Final    BOTTLES DRAWN AEROBIC ONLY Blood Culture results may not be optimal due to an inadequate volume of blood received in culture bottles Performed at [place] [address] [place], [place] [address]    Culture   Final    NO GROWTH < 24 HOURS Performed at [place] [address], [place], [place] [address]    Report Status PENDING  Incomplete  MRSA Next Gen by PCR, Nasal     Status: None   Collection Time: 12/28/20  8:18 PM   Specimen: Nasal Mucosa; Nasal Swab  Result Value Ref Range Status   MRSA by PCR Next Gen NOT DETECTED NOT DETECTED Final    Comment: (NOTE) The GeneXpert MRSA Assay (FDA approved for NASAL specimens only), is one component of a comprehensive MRSA colonization surveillance program. It is not intended to diagnose MRSA infection nor to guide or monitor treatment for MRSA infections. Test performance is not FDA approved in patients less than 19 years old. Performed at [facility], [place] [address], [place], [place] [address]      Studies: MR BRAIN WO CONTRAST  Result Date: 12/27/2020 CLINICAL DATA:  Delirium abnormal CT EXAM: MRI HEAD WITHOUT CONTRAST TECHNIQUE:  Multiplanar, multiecho pulse sequences of the brain and surrounding structures were obtained without intravenous contrast. COMPARISON:  Same day CT head. FINDINGS: Mildly motion limited study. Brain: No acute  infarction, hemorrhage, hydrocephalus, extra-axial collection or mass lesion. Moderate patchy T2/FLAIR hyperintensity within the supratentorial and pontine white matter, nonspecific but compatible with chronic microvascular ischemic disease. Moderate atrophy with ex vacuo ventricular dilation. Punctate foci of susceptibility artifact in the left cerebellum and right frontal lobe, probably chronic microhemorrhages. Vascular: Major arterial flow voids are maintained at the skull base. Skull and upper cervical spine: Normal marrow signal. Sinuses/Orbits: Clear sinuses.  Left phthisis bulbi. Other: Trace mastoid effusions. IMPRESSION: 1. No evidence of acute intracranial abnormality. 2. Moderate chronic microvascular ischemic disease and atrophy. Electronically Signed   By: Margaretha Sheffield M.D.   On: 12/27/2020 18:14   DG Chest Port 1 View  Result Date: 12/28/2020 CLINICAL DATA:  Altered mental status. EXAM: PORTABLE CHEST 1 VIEW COMPARISON:  December 22, 2020. FINDINGS: Marked cardiomegaly is noted. Left lung is clear. Mild right basilar atelectasis is noted with small right pleural effusion. Bony thorax is unremarkable. IMPRESSION: Marked cardiomegaly. Mild right basilar atelectasis is noted with small right pleural effusion. Electronically Signed   By: Marijo Conception M.D.   On: 12/28/2020 14:59     Alma Friendly, MD  [facility] 12/29/2020  If 7PM-7AM, please contact night-coverage

## 2020-12-29 NOTE — Progress Notes (Incomplete)
Progress Note  Patient Name: Tommy Morris Date of Encounter: 12/29/2020  [facility] Cardiologist: None ***  Subjective   ***  Inpatient Medications    Scheduled Meds:  aspirin EC  81 mg Oral Daily   Chlorhexidine Gluconate Cloth  6 each Topical Daily   enoxaparin (LOVENOX) injection  40 mg Subcutaneous Q24H   feeding supplement  237 mL Oral TID BM   ferrous sulfate  325 mg Oral Q breakfast   folic acid  1 mg Oral Daily   lactulose  10 g Oral BID   mouth rinse  15 mL Mouth Rinse BID   multivitamin with minerals  1 tablet Oral Daily   Continuous Infusions:  cefTRIAXone (ROCEPHIN)  IV Stopped (12/28/20 2314)   thiamine injection Stopped (12/29/20 1108)   PRN Meds: hydrALAZINE   Vital Signs    Vitals:   12/29/20 0800 12/29/20 1045 12/29/20 1100 12/29/20 1242  BP: 112/75 115/88 121/74   Pulse:      Resp: 17 19 (!) 21   Temp: (!) 97.5 F (36.4 C)   (!) 97.5 F (36.4 C)  TempSrc: Oral   Axillary  SpO2: 99%     Weight:      Height:        Intake/Output Summary (Last 24 hours) at 12/29/2020 1254 Last data filed at 12/29/2020 1039 Gross per 24 hour  Intake 1415.33 ml  Output 600 ml  Net 815.33 ml   Last 3 Weights 12/22/2020 08/24/2020 08/17/2020  Weight (lbs) 152 lb 1.9 oz 154 lb 3.2 oz 164 lb 12.8 oz  Weight (kg) 69 kg 69.945 kg 74.753 kg      Telemetry    *** - Personally Reviewed  ECG    *** - Personally Reviewed  Physical Exam  *** GEN: No acute distress.   Neck: No JVD Cardiac: RRR, no murmurs, rubs, or gallops.  Respiratory: Clear to auscultation bilaterally. GI: Soft, nontender, non-distended  MS: No edema; No deformity. Neuro:  Nonfocal  Psych: Normal affect   Labs    High Sensitivity Troponin:   Recent Labs  Lab 12/22/20 2009 12/23/20 0412  TROPONINIHS 40* 43*     Chemistry Recent Labs  Lab 12/25/20 0400 12/26/20 0354 12/27/20 0406 12/28/20 0356 12/29/20 0251  NA 140 141 143 142 143  K 3.4* 4.2 4.2 4.3 4.1  CL 108 112*  110 109 112*  CO2 26 24 25 24 23   GLUCOSE 99 109* 110* 117* 91  BUN 28* 27* 28* 36* 39*  CREATININE 1.20 1.14 1.35* 1.79* 1.85*  CALCIUM 8.4* 8.3* 8.8* 8.9 8.9  MG 1.8 1.9 1.9 2.2  --   PROT 6.7  --   --  7.0 6.7  ALBUMIN 2.2*  --   --  2.2* 2.1*  AST 91*  --   --  76* 69*  ALT 41  --   --  44 43  ALKPHOS 108  --   --  127* 136*  BILITOT 3.1*  --   --  2.7* 2.2*  GFRNONAA >60 >60 52* 37* 36*  ANIONGAP 6 5 8 9 8     Lipids No results for input(s): CHOL, TRIG, HDL, LABVLDL, LDLCALC, CHOLHDL in the last 168 hours.  Hematology Recent Labs  Lab 12/27/20 0406 12/28/20 0356 12/29/20 0251  WBC 3.8* 4.4 5.0  RBC 3.68* 3.51* 3.28*  HGB 10.4* 9.9* 9.0*  HCT 32.0* 30.6* 28.4*  MCV 87.0 87.2 86.6  MCH 28.3 28.2 27.4  MCHC 32.5 32.4  31.7  RDW 21.0* 20.6* 20.6*  PLT 117* 115* 99*   Thyroid  Recent Labs  Lab 12/22/20 2009 12/23/20 0412  TSH 5.858*  --   FREET4  --  0.96    BNPNo results for input(s): BNP, PROBNP in the last 168 hours.  DDimer No results for input(s): DDIMER in the last 168 hours.   Radiology    CT HEAD WO CONTRAST (5MM)  Result Date: 12/27/2020 CLINICAL DATA:  Mental status change EXAM: CT HEAD WITHOUT CONTRAST TECHNIQUE: Contiguous axial images were obtained from the base of the skull through the vertex without intravenous contrast. COMPARISON:  None. FINDINGS: Brain: Hypodensity in the pons and right cerebellar hemisphere (series 2, images 11 and 10). No acute hemorrhage, mass, mass effect, or midline shift. Degree of central volume loss is likely at the upper limits of normal for age. Periventricular white matter changes, likely the sequela of chronic small vessel ischemic disease. No extra-axial collection or hydrocephalus. Vascular: No hyperdense vessel. Skull: No acute osseous abnormality. Sinuses/Orbits: Left phthisis bulbi. Normal right globe. The sinuses are unremarkable. Other: Trace fluid in the mastoid air cells. IMPRESSION: Hypodensity in the pons and  right cerebellar hemisphere, of indeterminate acuity the. Consider MRI for further evaluation if clinically indicated. These results were called by telephone at the time of interpretation on 12/27/2020 at 3:48 pm to provider [facility] , who verbally acknowledged these results. Electronically Signed   By: Merilyn Baba M.D.   On: 12/27/2020 15:48   MR BRAIN WO CONTRAST  Result Date: 12/27/2020 CLINICAL DATA:  Delirium abnormal CT EXAM: MRI HEAD WITHOUT CONTRAST TECHNIQUE: Multiplanar, multiecho pulse sequences of the brain and surrounding structures were obtained without intravenous contrast. COMPARISON:  Same day CT head. FINDINGS: Mildly motion limited study. Brain: No acute infarction, hemorrhage, hydrocephalus, extra-axial collection or mass lesion. Moderate patchy T2/FLAIR hyperintensity within the supratentorial and pontine white matter, nonspecific but compatible with chronic microvascular ischemic disease. Moderate atrophy with ex vacuo ventricular dilation. Punctate foci of susceptibility artifact in the left cerebellum and right frontal lobe, probably chronic microhemorrhages. Vascular: Major arterial flow voids are maintained at the skull base. Skull and upper cervical spine: Normal marrow signal. Sinuses/Orbits: Clear sinuses.  Left phthisis bulbi. Other: Trace mastoid effusions. IMPRESSION: 1. No evidence of acute intracranial abnormality. 2. Moderate chronic microvascular ischemic disease and atrophy. Electronically Signed   By: Margaretha Sheffield M.D.   On: 12/27/2020 18:14   DG Chest Port 1 View  Result Date: 12/28/2020 CLINICAL DATA:  Altered mental status. EXAM: PORTABLE CHEST 1 VIEW COMPARISON:  December 22, 2020. FINDINGS: Marked cardiomegaly is noted. Left lung is clear. Mild right basilar atelectasis is noted with small right pleural effusion. Bony thorax is unremarkable. IMPRESSION: Marked cardiomegaly. Mild right basilar atelectasis is noted with small right pleural effusion.  Electronically Signed   By: Marijo Conception M.D.   On: 12/28/2020 14:59    Cardiac Studies   Echo 12/2020: 1. Left ventricular ejection fraction, by estimation, is 35 to 40%. The  left ventricle has moderately decreased function. The left ventricle  demonstrates global hypokinesis. There is moderate left ventricular  hypertrophy. Left ventricular diastolic  parameters are indeterminate.   2. Right ventricular systolic function is moderately reduced. The right  ventricular size is severely enlarged. There is mildly elevated pulmonary  artery systolic pressure. The estimated right ventricular systolic  pressure is 80.9 mmHg.   3. Left atrial size was severely dilated.   4. Right atrial size was  severely dilated.   5. A small to moderte pericardial effusion is present, measures 84mm  adjacent to LV inferior wall. There is no evidence of cardiac tamponade.   6. The mitral valve is abnormal. Moderate to severe mitral valve  regurgitation.   7. The tricuspid valve is abnormal. Tricuspid valve regurgitation is  severe.   8. The aortic valve is tricuspid. Aortic valve regurgitation is mild.  Aortic valve sclerosis/calcification is present, without any evidence of  aortic stenosis.   9. Aortic dilatation noted. There is dilatation of the ascending aorta,  measuring 39 mm.  10. The inferior vena cava is dilated in size with <50% respiratory  variability, suggesting right atrial pressure of 15 mmHg.   Patient Profile     80 y.o. male with a hx of  who is being seen 12/24/2020 for the evaluation of atrial fibrillation  Assessment & Plan    Atrial fibrillation - patient has declined [place] in the past - continue rate control strategy  - rates in the    Systolic heart failure with right heart failure - elevated PA pressure - GDMT limited by BP  - not currently diuresing - poor PO intake, poor output, rising creatinine - only 400 cc urine output yesterday - was on IVF   AKI - sCr 1.85  K 4.1   Small pericardial effusion - hemodynamically stable   Mitral regurgitation - moderate to severe - not a candidate for repair at this point, could consider structural heart team evaluation if mentation improves   Palliative care has been consulted, he remains full code.      {Are we signing off today?:[id number]}  For questions or updates, please contact [place] Please consult [email or web address] for contact info under        Signed, Ledora Bottcher, PA  12/29/2020, 12:54 PM

## 2020-12-30 DIAGNOSIS — I5022 Chronic systolic (congestive) heart failure: Secondary | ICD-10-CM | POA: Diagnosis not present

## 2020-12-30 DIAGNOSIS — N179 Acute kidney failure, unspecified: Secondary | ICD-10-CM | POA: Diagnosis not present

## 2020-12-30 DIAGNOSIS — I3139 Other pericardial effusion (noninflammatory): Secondary | ICD-10-CM | POA: Diagnosis not present

## 2020-12-30 DIAGNOSIS — F1010 Alcohol abuse, uncomplicated: Secondary | ICD-10-CM | POA: Diagnosis not present

## 2020-12-30 LAB — CBC WITH DIFFERENTIAL/PLATELET
Abs Immature Granulocytes: 0.01 10*3/uL (ref 0.00–0.07)
Basophils Absolute: 0 10*3/uL (ref 0.0–0.1)
Basophils Relative: 0 %
Eosinophils Absolute: 0 10*3/uL (ref 0.0–0.5)
Eosinophils Relative: 1 %
HCT: 30.2 % — ABNORMAL LOW (ref 39.0–52.0)
Hemoglobin: 10 g/dL — ABNORMAL LOW (ref 13.0–17.0)
Immature Granulocytes: 0 %
Lymphocytes Relative: 10 %
Lymphs Abs: 0.4 10*3/uL — ABNORMAL LOW (ref 0.7–4.0)
MCH: 28 pg (ref 26.0–34.0)
MCHC: 33.1 g/dL (ref 30.0–36.0)
MCV: 84.6 fL (ref 80.0–100.0)
Monocytes Absolute: 0.2 10*3/uL (ref 0.1–1.0)
Monocytes Relative: 5 %
Neutro Abs: 3.4 10*3/uL (ref 1.7–7.7)
Neutrophils Relative %: 84 %
Platelets: 95 10*3/uL — ABNORMAL LOW (ref 150–400)
RBC: 3.57 MIL/uL — ABNORMAL LOW (ref 4.22–5.81)
RDW: 20.2 % — ABNORMAL HIGH (ref 11.5–15.5)
WBC: 4 10*3/uL (ref 4.0–10.5)
nRBC: 3.2 % — ABNORMAL HIGH (ref 0.0–0.2)

## 2020-12-30 LAB — COMPREHENSIVE METABOLIC PANEL
ALT: 45 U/L — ABNORMAL HIGH (ref 0–44)
AST: 70 U/L — ABNORMAL HIGH (ref 15–41)
Albumin: 2.2 g/dL — ABNORMAL LOW (ref 3.5–5.0)
Alkaline Phosphatase: 152 U/L — ABNORMAL HIGH (ref 38–126)
Anion gap: 9 (ref 5–15)
BUN: 40 mg/dL — ABNORMAL HIGH (ref 8–23)
CO2: 24 mmol/L (ref 22–32)
Calcium: 9 mg/dL (ref 8.9–10.3)
Chloride: 109 mmol/L (ref 98–111)
Creatinine, Ser: 1.9 mg/dL — ABNORMAL HIGH (ref 0.61–1.24)
GFR, Estimated: 35 mL/min — ABNORMAL LOW (ref >60)
Glucose, Bld: 79 mg/dL (ref 70–99)
Potassium: 4.3 mmol/L (ref 3.5–5.1)
Sodium: 142 mmol/L (ref 135–145)
Total Bilirubin: 2.9 mg/dL — ABNORMAL HIGH (ref 0.3–1.2)
Total Protein: 7.1 g/dL (ref 6.5–8.1)

## 2020-12-30 LAB — PROTIME-INR
INR: 1.5 — ABNORMAL HIGH (ref 0.8–1.2)
Prothrombin Time: 17.7 seconds — ABNORMAL HIGH (ref 11.4–15.2)

## 2020-12-30 LAB — URINE CULTURE: Culture: NO GROWTH

## 2020-12-30 LAB — BRAIN NATRIURETIC PEPTIDE: B Natriuretic Peptide: 1654.9 pg/mL — ABNORMAL HIGH (ref 0.0–100.0)

## 2020-12-30 MED ORDER — ENOXAPARIN SODIUM 30 MG/0.3ML IJ SOSY
30.0000 mg | PREFILLED_SYRINGE | INTRAMUSCULAR | Status: DC
  Administered 2020-12-30: 30 mg via SUBCUTANEOUS
  Filled 2020-12-30: qty 0.3

## 2020-12-30 MED ORDER — FUROSEMIDE 10 MG/ML IJ SOLN
40.0000 mg | Freq: Every day | INTRAMUSCULAR | Status: DC
Start: 2020-12-30 — End: 2020-12-30
  Administered 2020-12-30: 40 mg via INTRAVENOUS
  Filled 2020-12-30: qty 4

## 2020-12-30 NOTE — Progress Notes (Signed)
Nutrition Follow-up  INTERVENTION:   -Ensure Enlive po TID, each supplement provides 350 kcal and 20 grams of protein  -Continue thiamine supplementation  -Multivitamin with minerals daily  NUTRITION DIAGNOSIS:   Inadequate oral intake related to acute illness as evidenced by per patient/family report.  Ongoing.  GOAL:   Patient will meet greater than or equal to 90% of their needs  Progressing.  MONITOR:   PO intake, Supplement acceptance, Labs, Weight trends, Skin, I & O's, GOC   ASSESSMENT:   80 y.o. male with medical history significant for bladder cancer s/p transurethral resection in 2130, chronic systolic heart failure, atrial fibrillation, etoh abuse, BPH and hypertension who presented to the hospital with generalized weakness.  Pt currently consuming 45-75% of meals. Accepting Ensure supplements. Thiamine supplementation continues given history of EtOH abuse.  Admission weight: 152 lbs. No other weights this admission.  Medications: Ferrous sulfate, Folic acid, Lactulose, Multivitamin with minerals daily, IV Thiamine  Labs reviewed:  CBGs: 85  Diet Order:   Diet Order             Diet Heart Room service appropriate? Yes; Fluid consistency: Thin  Diet effective now                   EDUCATION NEEDS:   No education needs have been identified at this time  Skin:  Skin Assessment: Skin Integrity Issues: Skin Integrity Issues:: Other (Comment) Other: skin tear on penis, non-pressure wound  Last BM:  12/4 -type 4  Height:   Ht Readings from Last 1 Encounters:  12/22/20 6\' 1"  (1.854 m)    Weight:   Wt Readings from Last 1 Encounters:  12/22/20 69 kg    Ideal Body Weight:  83.6 kg  BMI:  Body mass index is 20.07 kg/m.  Estimated Nutritional Needs:   Kcal:  1900-2200kcal/day  Protein:  95-110g/day  Fluid:  1.7-2.0L/day  Clayton Bibles, MS, RD, LDN Inpatient Clinical Dietitian Contact information available via Amion

## 2020-12-30 NOTE — Progress Notes (Signed)
PROGRESS NOTE  Tommy Morris HRC:[id number] DOB: 1940-06-06 DOA: 12/22/2020 PCP: Axel Filler, MD   LOS: 8 days   Brief narrative: Tommy Morris is a 80 y.o. male with medical history significant for remote history of bladder cancer s/p transurethral resection in 6803, chronic systolic heart failure, atrial fibrillation and hypertension presented to the hospital with generalized weakness.  Patient lives by himself at home and family had checked on him.  For the last 3 weeks, patient has been complaining of dizziness and difficulty with ambulation with decreased oral intake.  Patient does have history of alcohol consumption.  In the ED, patient was noted to have temperature of 99.2 F. Potassium was slightly elevated at 5.7.  Creatinine 1.6 from prior 0.9.  AST was elevated at 93 ALT of 42.  Total bilirubin of 4.4.  CK level was 919.  TSH of 5.8.  Chest x-ray showed a concerning for pericardial effusion.  Troponin was elevated at 40.  Bedside ultrasound was obtained in the ED which showed pericardial effusion.  Cardiology was consulted and patient was admitted to hospital with further evaluation and treatment.   Assessment/Plan:  Principal Problem:   Pericardial effusion Active Problems:   Anemia   Alcohol use disorder   Essential hypertension   Atrial fibrillation (HCC)   Chronic systolic heart failure (HCC)   Elevated bilirubin   AKI (acute kidney injury) ([facility])   Acute metabolic encephalopathy- More awake Hypothermia- improved ??sepsis Noted to be somewhat lethargic on 12/28/20, but able to follow commands and answer simple questions CT head with hypodensity in the pons and right cerebellar MRI brain no acute intracranial abnormality Patient noted to be persistently hypothermic, despite warm blankets and increasing temp in room, concern for possible ??sepsis, transferred to SDU on 12/28/20 Sepsis work-up ordered, UA turbid urine, many bacteria, greater than 50 WBC UC no  growth, BC X 2 NGTD Chest x-ray unremarkable for any infection Received IV Rocephin X 3, will d/c as no source of infection D/C IV fluids Monitor closely  Hyperbilirubinemia/Elevated liver enzymes 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 Likely secondary to ongoing alcohol usage Right upper quadrant ultrasound showing cirrhotic liver with ascites (ascites noted to be minimal, not enough to drain) [facility] consulted, trial of lactulose, otherwise no further recommendation Daily CMP  AKI with Elevated CK Creatinine levels were elevated at 1.6 compared to baseline 0.9 S/P IV fluids with no significant improvement Gave 1 dose of lasix on 12/30/20 for possible ??renal congestion May need nephrology consult if Cr worsens Daily CMP  Chronic systolic HF  Likely nonischemic from alcohol, hypertension  Appears euvolemic, difficult to assess BNP 1,654 Echo with EF 35 to 40%, global hypokinesis Cardiology consulted, might ideally be on hydralazine and nitroglycerin which can be initiated as outpatient as per cardiology Gave 1 dose of lasix on 12/30/20, may consider another dose pending Cr Hold Coreg for now due to soft BP  Generalized weakness/failure to thrive Physical therapy has seen the patient and recommend skilled nursing facility placement at this time Continue thiamine, folic acid.    Poor output and oral intake Diminished oral intake and poor output.  Encourage oral intake  Pericardial effusion  Could be secondary to CHF.  No evidence of hemodynamic instability.  Flu and COVID was negative. TSH elevated at 5.85 but free T4 within normal range.  EKG with atrial fibrillation with T wave inversion.  2D echocardiogram shows reduced LV function at 35 to 40% with global left ventricular hypokinesis and diastolic  dysfunction Cardiology on consulted, no further recs, signed off  Moderate to severe mitral regurgitation Cardiology consulted, poor candidate for any surgical intervention  Essential  hypertension BP currently soft, hold home amlodipine, metoprolol and Coreg for now  Anemia-normocytic Iron panel with low iron at 18 and saturation was low as well Vitamin B12 elevated.  Folic acid within normal range Oral iron supplementation   Alcohol use , indistinct speech possible mild metabolic encephalopathy Possibility of underlying dementia from alcohol or Alzheimer's with possible encephalopathy from medication, could not rule out component of Wernicke's encephalopathy Continue high-dose IV thiamine 500 mg IV 3 times daily for 2 days, followed by 250 mg IV daily for next 5 days Continue, multivitamin and folic acid Continue lactulose for adequate bowel movement  P. Atrial fibrillation Has declined anticoagulation in the past Heart rate is controlled Hold Coreg for now due to soft BP  Ethics/goals of care.  Patient with multiple comorbidities at advanced age with ongoing alcohol use and encephalopathy.  Appears to have overall poor to limited prognosis Palliative care consultation for discussion about goals of care-plan for SNF with palliative Patient's daughter feels like patient likely has underlying baseline dementia    DVT prophylaxis: enoxaparin (LOVENOX) injection 30 mg Start: 12/30/20 2200  Code Status: Full code  Family Communication:  None at bedside  Status is: Inpatient  Remains inpatient appropriate because: generalized weakness, related to thrive, prolonged alcohol use disorder, pericardial effusion, deconditioning, encephalopathy, need for skilled nursing facility placement.  Consultants: Cardiology Palliative care GI  Procedures: None  Anti-infectives:  None  Anti-infectives (From admission, onward)    Start     Dose/Rate Route Frequency Ordered Stop   12/28/20 1930  cefTRIAXone (ROCEPHIN) 1 g in sodium chloride 0.9 % 100 mL IVPB        1 g 200 mL/hr over 30 Minutes Intravenous Every 24 hours 12/28/20 1835        Subjective:  Patient  denies any new complaints.  Still with poor urine output.   Objective: Vitals:   12/30/20 0901 12/30/20 1323  BP: 108/82 108/85  Pulse: 65 96  Resp:  18  Temp: 98 F (36.7 C) (!) 97.5 F (36.4 C)  SpO2: 98%     Intake/Output Summary (Last 24 hours) at 12/30/2020 1711 Last data filed at 12/30/2020 1300 Gross per 24 hour  Intake 719.28 ml  Output 1544 ml  Net -824.72 ml   Filed Weights   12/22/20 1807  Weight: 69 kg   Body mass index is 20.07 kg/m.     Physical Exam: General: NAD Cardiovascular: S1, S2 present Respiratory: CTAB Abdomen: Soft, nontender, nondistended, bowel sounds present Musculoskeletal: No bilateral pedal edema noted Skin: Normal Psychiatry: Normal mood    Data Review: I have personally reviewed the following laboratory data and studies,  CBC: Recent Labs  Lab 12/26/20 0354 12/27/20 0406 12/28/20 0356 12/29/20 0251 12/30/20 0311  WBC 4.6 3.8* 4.4 5.0 4.0  NEUTROABS  --   --   --  4.4 3.4  HGB 10.1* 10.4* 9.9* 9.0* 10.0*  HCT 31.0* 32.0* 30.6* 28.4* 30.2*  MCV 84.9 87.0 87.2 86.6 84.6  PLT 128* 117* 115* 99* 95*   Basic Metabolic Panel: Recent Labs  Lab 12/24/20 0430 12/25/20 0400 12/26/20 0354 12/27/20 0406 12/28/20 0356 12/29/20 0251 12/30/20 0311  NA 142 140 141 143 142 143 142  K 3.0* 3.4* 4.2 4.2 4.3 4.1 4.3  CL 109 108 112* 110 109 112* 109  CO2 26 26  24 25 24 23 24   GLUCOSE 94 99 109* 110* 117* 91 79  BUN 34* 28* 27* 28* 36* 39* 40*  CREATININE 1.34* 1.20 1.14 1.35* 1.79* 1.85* 1.90*  CALCIUM 8.5* 8.4* 8.3* 8.8* 8.9 8.9 9.0  MG 1.8 1.8 1.9 1.9 2.2  --   --   PHOS 3.3  --   --   --   --   --   --    Liver Function Tests: Recent Labs  Lab 12/25/20 0400 12/28/20 0356 12/29/20 0251 12/30/20 0311  AST 91* 76* 69* 70*  ALT 41 44 43 45*  ALKPHOS 108 127* 136* 152*  BILITOT 3.1* 2.7* 2.2* 2.9*  PROT 6.7 7.0 6.7 7.1  ALBUMIN 2.2* 2.2* 2.1* 2.2*   No results for input(s): LIPASE, AMYLASE in the last 168  hours. Recent Labs  Lab 12/26/20 1132 12/29/20 1703  AMMONIA 29 34   Cardiac Enzymes: No results for input(s): CKTOTAL, CKMB, CKMBINDEX, TROPONINI in the last 168 hours.  BNP (last 3 results) Recent Labs    07/04/20 1032 12/30/20 0311  BNP 558.9* 1,654.9*    ProBNP (last 3 results) No results for input(s): PROBNP in the last 8760 hours.  CBG: Recent Labs  Lab 12/28/20 1312 12/29/20 0747  GLUCAP 121* 85   Recent Results (from the past 240 hour(s))  Urine Culture     Status: Abnormal   Collection Time: 12/22/20  6:17 PM   Specimen: Urine, Clean Catch  Result Value Ref Range Status   Specimen Description   Final    URINE, CLEAN CATCH Performed at [facility], [place] [address] [place], [place] [address]    Special Requests   Final    NONE Performed at [facility], [place] [address], [place], [place] [address]    Culture (A)  Final    <10,000 COLONIES/mL INSIGNIFICANT GROWTH Performed at [facility] [address], [place], [place] [address]    Report Status 12/25/2020 FINAL  Final  Resp Panel by RT-PCR (Flu A&B, Covid) Nasopharyngeal Swab     Status: None   Collection Time: 12/22/20 10:17 PM   Specimen: Nasopharyngeal Swab; Nasopharyngeal(NP) swabs in vial transport medium  Result Value Ref Range Status   SARS Coronavirus 2 by RT PCR NEGATIVE NEGATIVE Final    Comment: (NOTE) SARS-CoV-2 target nucleic acids are NOT DETECTED.  The SARS-CoV-2 RNA is generally detectable in upper respiratory specimens during the acute phase of infection. The lowest concentration of SARS-CoV-2 viral copies this assay can detect is 138 copies/mL. A negative result does not preclude SARS-Cov-2 infection and should not be used as the sole basis for treatment or other patient management decisions. A negative result may occur with  improper specimen collection/handling, submission of specimen other than nasopharyngeal swab, presence of viral  mutation(s) within the areas targeted by this assay, and inadequate number of viral copies(<138 copies/mL). A negative result must be combined with clinical observations, patient history, and epidemiological information. The expected result is Negative.  Fact Sheet for Patients:  EntrepreneurPulse.com.au  Fact Sheet for Healthcare Providers:  IncredibleEmployment.be  This test is no t yet approved or cleared by the [place] FDA and  has been authorized for detection and/or diagnosis of SARS-CoV-2 by FDA under an Emergency Use Authorization (EUA). This EUA will remain  in effect (meaning this test can be used) for the duration of the COVID-19 declaration under Section 564(b)(1) of the Act, 21 U.S.C.section 360bbb-3(b)(1), unless the authorization is  terminated  or revoked sooner.       Influenza A by PCR NEGATIVE NEGATIVE Final   Influenza B by PCR NEGATIVE NEGATIVE Final    Comment: (NOTE) The Xpert Xpress SARS-CoV-2/FLU/RSV plus assay is intended as an aid in the diagnosis of influenza from Nasopharyngeal swab specimens and should not be used as a sole basis for treatment. Nasal washings and aspirates are unacceptable for Xpert Xpress SARS-CoV-2/FLU/RSV testing.  Fact Sheet for Patients: EntrepreneurPulse.com.au  Fact Sheet for Healthcare Providers: IncredibleEmployment.be  This test is not yet approved or cleared by the [place] FDA and has been authorized for detection and/or diagnosis of SARS-CoV-2 by FDA under an Emergency Use Authorization (EUA). This EUA will remain in effect (meaning this test can be used) for the duration of the COVID-19 declaration under Section 564(b)(1) of the Act, 21 U.S.C. section 360bbb-3(b)(1), unless the authorization is terminated or revoked.  Performed at [facility], [place] [address], [place], [place] [address]   Urine Culture      Status: None   Collection Time: 12/28/20  2:35 PM   Specimen: In/Out Cath Urine  Result Value Ref Range Status   Specimen Description   Final    IN/OUT CATH URINE Performed at [place] [address] [place], [place] [address]    Special Requests   Final    NONE Performed at [facility], [place] [address] [place], [place] [address]    Culture   Final    NO GROWTH Performed at [facility], [place] [address], [place], [place] [address]    Report Status 12/30/2020 FINAL  Final  Culture, blood (routine x 2)     Status: None (Preliminary result)   Collection Time: 12/28/20  3:06 PM   Specimen: BLOOD  Result Value Ref Range Status   Specimen Description   Final    BLOOD LEFT ANTECUBITAL Performed at [facility] [address] [place], [place] [address]    Special Requests   Final    BOTTLES DRAWN AEROBIC ONLY Blood Culture results may not be optimal due to an inadequate volume of blood received in culture bottles Performed at [facility] [address], [place], [place] [address]    Culture   Final    NO GROWTH 2 DAYS Performed at [facility] [address], [place], [place] [address]    Report Status PENDING  Incomplete  Culture, blood (routine x 2)     Status: None (Preliminary result)   Collection Time: 12/28/20  3:06 PM   Specimen: BLOOD  Result Value Ref Range Status   Specimen Description   Final    BLOOD BLOOD LEFT WRIST Performed at [facility] [place] [address], [place], [place] [address]    Special Requests   Final    BOTTLES DRAWN AEROBIC ONLY Blood Culture results may not be optimal due to an inadequate volume of blood received in culture bottles Performed at [facility] [address], [place], [place] [address]    Culture   Final    NO GROWTH 2 DAYS Performed at [facility] [address], [place], [place] [address]     Report Status PENDING  Incomplete  MRSA Next Gen by PCR, Nasal     Status: None   Collection Time: 12/28/20  8:18 PM   Specimen: Nasal Mucosa; Nasal Swab  Result Value Ref Range Status  MRSA by PCR Next Gen NOT DETECTED NOT DETECTED Final    Comment: (NOTE) The GeneXpert MRSA Assay (FDA approved for NASAL specimens only), is one component of a comprehensive MRSA colonization surveillance program. It is not intended to diagnose MRSA infection nor to guide or monitor treatment for MRSA infections. Test performance is not FDA approved in patients less than 28 years old. Performed at [facility], [place] [address] [place], [place] [address]      Studies: [place] ASCITES (ABDOMEN LIMITED)  Result Date: 12/29/2020 CLINICAL DATA:  Evaluate for paracentesis. EXAM: LIMITED ABDOMEN ULTRASOUND FOR ASCITES TECHNIQUE: Limited ultrasound survey for ascites was performed in all four abdominal quadrants. COMPARISON:  Ultrasound 12/22/2020 FINDINGS: Trace perihepatic ascites. Minimal fluid in the right lower quadrant, left upper quadrant and left lower quadrant. IMPRESSION: Small amount of ascites. Paracentesis was not performed due to the small amount of fluid. Electronically Signed   By: Markus Daft M.D.   On: 12/29/2020 17:21     Alma Friendly, MD  [facility] 12/30/2020  If 7PM-7AM, please contact night-coverage

## 2020-12-30 NOTE — Consult Note (Signed)
Referring Provider: [facility] Primary Care Physician:  Tommy Filler, MD Primary Gastroenterologist:  unassigned  Reason for Consultation:  Cirrhosis  HPI: Tommy Morris is a 80 y.o. male medical history significant for remote history of bladder cancer s/p transurethral resection in 6962, chronic systolic heart failure, atrial fibrillation and hypertension presents for evaluation of cirrhosis.  Patient originally presented for weakness. History obtained per chart review as patient has AMS and unable to provide history. Appears he has been struggling with dizziness and weakness for 3 weeks. History of daily liquor use for 40 years. GI consulted for cirrhosis. Patient is not oriented to time/place so unable to provide adequate history. [place] 12/1 showed cirrhotic changes and ascites.   Past Medical History:  Diagnosis Date   Atrial fibrillation ([place]) chronic    echo 05/03/2009 mildly to moderately reduced LV systolic function ef 95-28%  Patient declines anticoagulation for stroke prophylaxis.   BPH (benign prostatic hypertrophy) with urinary obstruction    Diverticulosis of colon    History of bladder cancer    2006  S/P TURBT  UROTHELIAL CARCINOMA   History of gout    History of urinary retention    LAST ISSUE 11/2012 W/ ARF   Hypertension    Mild mitral valve prolapse    Mitral valve regurgitation    OA (osteoarthritis) of knee    Pulmonary nodule    Two small right lung nodules on chest CT 04/25/2009    Past Surgical History:  Procedure Laterality Date   CYSTOSCOPY WITH RETROGRADE PYELOGRAM, URETEROSCOPY AND STENT PLACEMENT Bilateral 01/06/2013   Procedure: CYSTOSCOPY WITH BILATERAL RETROGRADE PYELOGRAM, ;  Surgeon: Irine Seal, MD;  Location: [place];  Service: Urology;  Laterality: Bilateral;   TONSILLECTOMY  AGE 67   TRANSTHORACIC ECHOCARDIOGRAM  05-03-2009   DIFFUSE HYPOKINESIS/ EF 40-45%/  MILD MVP WITH MILD TO MODERATE MR/  MILDLY DILATED LA & RA    TRANSURETHRAL RESECTION OF BLADDER TUMOR  10-26-2004   PLACEMENT RIGHT URETERAL STENT AND TRANSRECTAL PROSTATE BX   TRANSURETHRAL RESECTION OF BLADDER TUMOR WITH GYRUS (TURBT-GYRUS) N/A 01/06/2013   Procedure: TRANSURETHRAL RESECTION OF BLADDER TUMOR WITH GYRUS (TURBT-GYRUS);  Surgeon: Irine Seal, MD;  Location: [facility];  Service: Urology;  Laterality: N/A;   TRANSURETHRAL RESECTION OF PROSTATE N/A 01/06/2013   Procedure: TRANSURETHRAL RESECTION OF THE PROSTATE (TURP);  Surgeon: Irine Seal, MD;  Location: [facility];  Service: Urology;  Laterality: N/A;    Prior to Admission medications   Medication Sig Start Date End Date Taking? Authorizing Provider  amLODipine (NORVASC) 5 MG tablet Take 1 tablet (5 mg total) by mouth daily. 10/14/20  Yes Tommy Filler, MD  aspirin 81 MG EC tablet Take 1 tablet (81 mg total) by mouth daily. 09/14/19  Yes Masoudi, Elhamalsadat, MD  furosemide (LASIX) 20 MG tablet Take 1 tablet (20 mg total) by mouth 2 (two) times daily. 08/17/20 08/17/21 Yes Angelica Pou, MD  metoprolol succinate (TOPROL-XL) 25 MG 24 hr tablet TAKE 1 TABLET(25 MG) BY MOUTH DAILY Patient taking differently: 25 mg daily. 05/13/20  Yes Tommy Filler, MD  Skin Protectants, Misc. (EUCERIN) cream Apply topically as needed for dry skin. Patient taking differently: Apply 1 application topically daily as needed for dry skin. 02/05/17  Yes Tommy Filler, MD  triamcinolone ointment (KENALOG) 0.1 % Apply topically daily. Patient not taking: Reported on 12/23/2020 03/16/19   Tommy Filler, MD    Scheduled Meds:  aspirin EC  81 mg Oral Daily   Chlorhexidine Gluconate Cloth  6 each Topical Daily   enoxaparin (LOVENOX) injection  40 mg Subcutaneous Q24H   feeding supplement  237 mL Oral TID BM   ferrous sulfate  325 mg Oral Q breakfast   folic acid  1 mg Oral Daily   furosemide  40 mg Intravenous Daily   lactulose  10 g Oral BID    mouth rinse  15 mL Mouth Rinse BID   multivitamin with minerals  1 tablet Oral Daily   Continuous Infusions:  cefTRIAXone (ROCEPHIN)  IV Stopped (12/29/20 1855)   thiamine injection Stopped (12/30/20 1250)   PRN Meds:.hydrALAZINE  Allergies as of 12/22/2020   (No Known Allergies)    Family History  Problem Relation Age of Onset   Alzheimer's disease Mother        Died in 59 at age 57.   Cancer Neg Hx    Diabetes Neg Hx    Hypertension Neg Hx    Heart disease Neg Hx     Social History   Socioeconomic History   Marital status: Widowed    Spouse name: Not on file   Number of children: Not on file   Years of education: Not on file   Highest education level: Not on file  Occupational History   Not on file  Tobacco Use   Smoking status: Former    Packs/day: 0.20    Years: 50.00    Pack years: 10.00    Types: Cigarettes    Quit date: 01/22/2002    Years since quitting: 18.9   Smokeless tobacco: Former    Types: Chew    Quit date: 01/23/1964  Substance and Sexual Activity   Alcohol use: Yes    Alcohol/week: 21.0 standard drinks    Types: 21 Standard drinks or equivalent per week    Comment: Average of about 3 drinks per day.   Drug use: No   Sexual activity: Not on file  Other Topics Concern   Not on file  Social History Narrative   Not on file   Social Determinants of Health   Financial Resource Strain: Not on file  Food Insecurity: Not on file  Transportation Needs: Not on file  Physical Activity: Not on file  Stress: Not on file  Social Connections: Not on file  Intimate Partner Violence: Not on file    Review of Systems: Review of Systems  Unable to perform ROS: Mental status change    Physical Exam:Physical Exam Constitutional:      Appearance: Normal appearance.  HENT:     Nose: Nose normal.     Mouth/Throat:     Mouth: Mucous membranes are moist.     Pharynx: Oropharynx is clear.  Eyes:     General: Scleral icterus present.     Extraocular  Movements: Extraocular movements intact.     Conjunctiva/sclera: Conjunctivae normal.  Abdominal:     General: Abdomen is flat. There is no distension.     Palpations: Abdomen is soft. There is no mass.     Tenderness: There is no abdominal tenderness.     Hernia: No hernia is present.  Musculoskeletal:        General: No tenderness. Normal range of motion.     Cervical back: Normal range of motion and neck supple.  Skin:    General: Skin is warm.     Coloration: Skin is not pale.  Neurological:     Mental Status: He is  alert. He is disoriented.     Comments: Not oriented to place, time, year.  Psychiatric:        Mood and Affect: Mood normal.        Behavior: Behavior normal.    Vital signs: Vitals:   12/30/20 0901 12/30/20 1323  BP: 108/82 108/85  Pulse: 65 96  Resp:  18  Temp: 98 F (36.7 C) (!) 97.5 F (36.4 C)  SpO2: 98%    Last BM Date: 12/25/20    GI:  Lab Results: Recent Labs    12/28/20 0356 12/29/20 0251 12/30/20 0311  WBC 4.4 5.0 4.0  HGB 9.9* 9.0* 10.0*  HCT 30.6* 28.4* 30.2*  PLT 115* 99* 95*   BMET Recent Labs    12/28/20 0356 12/29/20 0251 12/30/20 0311  NA 142 143 142  K 4.3 4.1 4.3  CL 109 112* 109  CO2 _0 GLUCOSE 117* 91 79  BUN 36* 39* 40*  CREATININE 1.79* 1.85* 1.90*  CALCIUM 8.9 8.9 9.0   LFT Recent Labs    12/30/20 0311  PROT 7.1  ALBUMIN 2.2*  AST 70*  ALT 45*  ALKPHOS 152*  BILITOT 2.9*   PT/INR Recent Labs    12/29/20 0251 12/30/20 0311  LABPROT 17.6* 17.7*  INR 1.4* 1.5*     Studies/Results: DG Chest Port 1 View  Result Date: 12/28/2020 CLINICAL DATA:  Altered mental status. EXAM: PORTABLE CHEST 1 VIEW COMPARISON:  December 22, 2020. FINDINGS: Marked cardiomegaly is noted. Left lung is clear. Mild right basilar atelectasis is noted with small right pleural effusion. Bony thorax is unremarkable. IMPRESSION: Marked cardiomegaly. Mild right basilar atelectasis is noted with small right pleural  effusion. Electronically Signed   By: Marijo Conception M.D.   On: 12/28/2020 14:59   [place] ASCITES (ABDOMEN LIMITED)  Result Date: 12/29/2020 CLINICAL DATA:  Evaluate for paracentesis. EXAM: LIMITED ABDOMEN ULTRASOUND FOR ASCITES TECHNIQUE: Limited ultrasound survey for ascites was performed in all four abdominal quadrants. COMPARISON:  Ultrasound 12/22/2020 FINDINGS: Trace perihepatic ascites. Minimal fluid in the right lower quadrant, left upper quadrant and left lower quadrant. IMPRESSION: Small amount of ascites. Paracentesis was not performed due to the small amount of fluid. Electronically Signed   By: Markus Daft M.D.   On: 12/29/2020 17:21    Impression: Cirrhosis; likely alcohol induced - [place] 12/1: cirrhosis with ascites, no gallstones, renal cysts - ascites not sufficient enough for paracentesis. - AST 70/ ALT 45/ Alk phos 152 - T. Bili 2.9 - MELD 21 - INR 1.5, platelets 95, Hgb 10.0  Alcohol use  AKI  Pericardial effusion  Chronic diastolic heart failure Plan: AMS with unknown baseline, unable to provide history.  Could benefit from lactulose and titrate to 2-3 Bms per day. Ascites was not sufficient for IR to do paracentesis. Continue with palliative care management. Recommend continue supportive care [facility] will follow  LOS: 8 days   Denzell Colasanti Radford Pax  PA-C 12/30/2020, 2:40 PM  Contact #  [phone]

## 2020-12-31 DIAGNOSIS — I3139 Other pericardial effusion (noninflammatory): Secondary | ICD-10-CM | POA: Diagnosis not present

## 2020-12-31 LAB — COMPREHENSIVE METABOLIC PANEL
ALT: 41 U/L (ref 0–44)
AST: 59 U/L — ABNORMAL HIGH (ref 15–41)
Albumin: 2.2 g/dL — ABNORMAL LOW (ref 3.5–5.0)
Alkaline Phosphatase: 153 U/L — ABNORMAL HIGH (ref 38–126)
Anion gap: 8 (ref 5–15)
BUN: 41 mg/dL — ABNORMAL HIGH (ref 8–23)
CO2: 25 mmol/L (ref 22–32)
Calcium: 8.8 mg/dL — ABNORMAL LOW (ref 8.9–10.3)
Chloride: 107 mmol/L (ref 98–111)
Creatinine, Ser: 1.78 mg/dL — ABNORMAL HIGH (ref 0.61–1.24)
GFR, Estimated: 38 mL/min — ABNORMAL LOW (ref >60)
Glucose, Bld: 76 mg/dL (ref 70–99)
Potassium: 3.7 mmol/L (ref 3.5–5.1)
Sodium: 140 mmol/L (ref 135–145)
Total Bilirubin: 2.8 mg/dL — ABNORMAL HIGH (ref 0.3–1.2)
Total Protein: 7 g/dL (ref 6.5–8.1)

## 2020-12-31 LAB — PROTIME-INR
INR: 1.4 — ABNORMAL HIGH (ref 0.8–1.2)
Prothrombin Time: 17.3 seconds — ABNORMAL HIGH (ref 11.4–15.2)

## 2020-12-31 LAB — CBC WITH DIFFERENTIAL/PLATELET
Abs Immature Granulocytes: 0.01 10*3/uL (ref 0.00–0.07)
Basophils Absolute: 0 10*3/uL (ref 0.0–0.1)
Basophils Relative: 0 %
Eosinophils Absolute: 0.1 10*3/uL (ref 0.0–0.5)
Eosinophils Relative: 1 %
HCT: 28.7 % — ABNORMAL LOW (ref 39.0–52.0)
Hemoglobin: 9.5 g/dL — ABNORMAL LOW (ref 13.0–17.0)
Immature Granulocytes: 0 %
Lymphocytes Relative: 9 %
Lymphs Abs: 0.3 10*3/uL — ABNORMAL LOW (ref 0.7–4.0)
MCH: 27.9 pg (ref 26.0–34.0)
MCHC: 33.1 g/dL (ref 30.0–36.0)
MCV: 84.4 fL (ref 80.0–100.0)
Monocytes Absolute: 0.2 10*3/uL (ref 0.1–1.0)
Monocytes Relative: 5 %
Neutro Abs: 3.2 10*3/uL (ref 1.7–7.7)
Neutrophils Relative %: 85 %
Platelets: 110 10*3/uL — ABNORMAL LOW (ref 150–400)
RBC: 3.4 MIL/uL — ABNORMAL LOW (ref 4.22–5.81)
RDW: 20.3 % — ABNORMAL HIGH (ref 11.5–15.5)
WBC: 3.8 10*3/uL — ABNORMAL LOW (ref 4.0–10.5)
nRBC: 3.4 % — ABNORMAL HIGH (ref 0.0–0.2)

## 2020-12-31 LAB — CK: Total CK: 89 U/L (ref 49–397)

## 2020-12-31 MED ORDER — QUETIAPINE FUMARATE 25 MG PO TABS: 25.0000 mg | ORAL_TABLET | Freq: Once | ORAL | Status: DC

## 2020-12-31 MED ORDER — ENOXAPARIN SODIUM 40 MG/0.4ML IJ SOSY
40.0000 mg | PREFILLED_SYRINGE | INTRAMUSCULAR | Status: DC
  Administered 2020-12-31 – 2021-01-03 (×4): 40 mg via SUBCUTANEOUS
  Filled 2020-12-31 (×5): qty 0.4

## 2020-12-31 NOTE — Progress Notes (Signed)
PROGRESS NOTE  Tommy Morris ZJQ:[id number] DOB: Oct 05, 1940 DOA: 12/22/2020 PCP: Axel Filler, MD   LOS: 9 days   Brief narrative: Tommy Morris is a 80 y.o. male with medical history significant for remote history of bladder cancer s/p transurethral resection in 2409, chronic systolic heart failure, atrial fibrillation and hypertension presented to the hospital with generalized weakness.  Patient lives by himself at home and family had checked on him.  For the last 3 weeks, patient has been complaining of dizziness and difficulty with ambulation with decreased oral intake.  Patient does have history of alcohol consumption.  In the ED, patient was noted to have temperature of 99.2 F. Potassium was slightly elevated at 5.7.  Creatinine 1.6 from prior 0.9.  AST was elevated at 93 ALT of 42.  Total bilirubin of 4.4.  CK level was 919.  TSH of 5.8.  Chest x-ray showed a concerning for pericardial effusion.  Troponin was elevated at 40.  Bedside ultrasound was obtained in the ED which showed pericardial effusion.  Cardiology was consulted and patient was admitted to hospital with further evaluation and treatment.  12/31/20: Seen in his bedside.  He is alert and confused.  He has no new complaints.  States he wants to go home.     Assessment/Plan:  Principal Problem:   Pericardial effusion Active Problems:   Anemia   Alcohol use disorder   Essential hypertension   Atrial fibrillation (HCC)   Chronic systolic heart failure (HCC)   Elevated bilirubin   AKI (acute kidney injury) ([place])   Acute metabolic encephalopathy- More awake Hypothermia- improved ??sepsis Noted to be somewhat lethargic on 12/28/20, but able to follow commands and answer simple questions CT head with hypodensity in the pons and right cerebellar MRI brain no acute intracranial abnormality Patient noted to be persistently hypothermic, despite warm blankets and increasing temp in room, concern for possible ??sepsis,  transferred to SDU on 12/28/20 Sepsis work-up ordered, UA turbid urine, many bacteria, greater than 50 WBC UC no growth, BC X 2 NGTD Chest x-ray unremarkable for any infection Received IV Rocephin X 3, will d/c as no source of infection D/C IV fluids Monitor closely  Hyperbilirubinemia/Elevated liver enzymes 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 Likely secondary to ongoing alcohol usage Right upper quadrant ultrasound showing cirrhotic liver with ascites (ascites noted to be minimal, not enough to drain) [facility] consulted, trial of lactulose, otherwise no further recommendation Daily CMP  AKI with Elevated CK Creatinine levels were elevated at 1.6 compared to baseline 0.9 S/P IV fluids with no significant improvement Gave 1 dose of lasix on 12/30/20 for possible ??renal congestion May need nephrology consult if Cr worsens Daily CMP  Chronic systolic HF  Likely nonischemic from alcohol, hypertension  Appears euvolemic, difficult to assess BNP 1,654 Echo with EF 35 to 40%, global hypokinesis Cardiology consulted, might ideally be on hydralazine and nitroglycerin which can be initiated as outpatient as per cardiology Gave 1 dose of lasix on 12/30/20, may consider another dose pending Cr Hold Coreg for now due to soft BP  Generalized weakness/failure to thrive Physical therapy has seen the patient and recommend skilled nursing facility placement at this time Continue thiamine, folic acid.    Poor output and oral intake Diminished oral intake and poor output.  Encourage oral intake  Pericardial effusion  Could be secondary to CHF.  No evidence of hemodynamic instability.  Flu and COVID was negative. TSH elevated at 5.85 but free T4 within normal range.  EKG with atrial fibrillation with T wave inversion.  2D echocardiogram shows reduced LV function at 35 to 40% with global left ventricular hypokinesis and diastolic dysfunction Cardiology on consulted, no further recs, signed  off  Moderate to severe mitral regurgitation Cardiology consulted, poor candidate for any surgical intervention  Essential hypertension BP currently soft, hold home amlodipine, metoprolol and Coreg for now  Anemia-normocytic Iron panel with low iron at 18 and saturation was low as well Vitamin B12 elevated.  Folic acid within normal range Oral iron supplementation   Alcohol use , indistinct speech possible mild metabolic encephalopathy Possibility of underlying dementia from alcohol or Alzheimer's with possible encephalopathy from medication, could not rule out component of Wernicke's encephalopathy Continue high-dose IV thiamine 500 mg IV 3 times daily for 2 days, followed by 250 mg IV daily for next 5 days Continue, multivitamin and folic acid Continue lactulose for adequate bowel movement  P. Atrial fibrillation Has declined anticoagulation in the past Heart rate is controlled Hold Coreg for now due to soft BP  Ethics/goals of care.  Patient with multiple comorbidities at advanced age with ongoing alcohol use and encephalopathy.  Appears to have overall poor to limited prognosis Palliative care consultation for discussion about goals of care-plan for SNF with palliative Patient's daughter feels like patient likely has underlying baseline dementia    DVT prophylaxis: enoxaparin (LOVENOX) injection 40 mg Start: 12/31/20 2200  Code Status: Full code  Family Communication:  None at bedside  Status is: Inpatient  Remains inpatient appropriate because: generalized weakness, related to thrive, prolonged alcohol use disorder, pericardial effusion, deconditioning, encephalopathy, need for skilled nursing facility placement.  Consultants: Cardiology Palliative care GI  Procedures: None  Anti-infectives:  None  Anti-infectives (From admission, onward)    Start     Dose/Rate Route Frequency Ordered Stop   12/28/20 1930  cefTRIAXone (ROCEPHIN) 1 g in sodium chloride 0.9  % 100 mL IVPB  Status:  Discontinued        1 g 200 mL/hr over 30 Minutes Intravenous Every 24 hours 12/28/20 1835 12/30/20 1719      Subjective:  Patient denies any new complaints.  Still with poor urine output.   Objective: Vitals:   12/30/20 1942 12/31/20 0610  BP: (!) 117/93 127/84  Pulse: (!) 59 (!) 55  Resp: 18 16  Temp: 97.6 F (36.4 C) 97.6 F (36.4 C)  SpO2: 100%     Intake/Output Summary (Last 24 hours) at 12/31/2020 1349 Last data filed at 12/31/2020 0300 Gross per 24 hour  Intake 210 ml  Output 850 ml  Net -640 ml   Filed Weights   12/22/20 1807  Weight: 69 kg   Body mass index is 20.07 kg/m.     Physical Exam: General: NAD Cardiovascular: S1, S2 present Respiratory: CTAB Abdomen: Soft, nontender, nondistended, bowel sounds present Musculoskeletal: No bilateral pedal edema noted Skin: Normal Psychiatry: Normal mood    Data Review: I have personally reviewed the following laboratory data and studies,  CBC: Recent Labs  Lab 12/27/20 0406 12/28/20 0356 12/29/20 0251 12/30/20 0311 12/31/20 0549  WBC 3.8* 4.4 5.0 4.0 3.8*  NEUTROABS  --   --  4.4 3.4 3.2  HGB 10.4* 9.9* 9.0* 10.0* 9.5*  HCT 32.0* 30.6* 28.4* 30.2* 28.7*  MCV 87.0 87.2 86.6 84.6 84.4  PLT 117* 115* 99* 95* 614*   Basic Metabolic Panel: Recent Labs  Lab 12/25/20 0400 12/26/20 0354 12/27/20 0406 12/28/20 0356 12/29/20 0251 12/30/20 4315 12/31/20 4008  NA 140 141 143 142 143 142 140  K 3.4* 4.2 4.2 4.3 4.1 4.3 3.7  CL 108 112* 110 109 112* 109 107  CO2 26 24 25 24 23 24 25   GLUCOSE 99 109* 110* 117* 91 79 76  BUN 28* 27* 28* 36* 39* 40* 41*  CREATININE 1.20 1.14 1.35* 1.79* 1.85* 1.90* 1.78*  CALCIUM 8.4* 8.3* 8.8* 8.9 8.9 9.0 8.8*  MG 1.8 1.9 1.9 2.2  --   --   --    Liver Function Tests: Recent Labs  Lab 12/25/20 0400 12/28/20 0356 12/29/20 0251 12/30/20 0311 12/31/20 0549  AST 91* 76* 69* 70* 59*  ALT 41 44 43 45* 41  ALKPHOS 108 127* 136* 152* 153*   BILITOT 3.1* 2.7* 2.2* 2.9* 2.8*  PROT 6.7 7.0 6.7 7.1 7.0  ALBUMIN 2.2* 2.2* 2.1* 2.2* 2.2*   No results for input(s): LIPASE, AMYLASE in the last 168 hours. Recent Labs  Lab 12/26/20 1132 12/29/20 1703  AMMONIA 29 34   Cardiac Enzymes: Recent Labs  Lab 12/31/20 0549  CKTOTAL 89    BNP (last 3 results) Recent Labs    07/04/20 1032 12/30/20 0311  BNP 558.9* 1,654.9*    ProBNP (last 3 results) No results for input(s): PROBNP in the last 8760 hours.  CBG: Recent Labs  Lab 12/28/20 1312 12/29/20 0747  GLUCAP 121* 85   Recent Results (from the past 240 hour(s))  Urine Culture     Status: Abnormal   Collection Time: 12/22/20  6:17 PM   Specimen: Urine, Clean Catch  Result Value Ref Range Status   Specimen Description   Final    URINE, CLEAN CATCH Performed at [facility], [place] [address], [place], [place] [address]    Special Requests   Final    NONE Performed at [facility], [place] [address] [place], [place] [address]    Culture (A)  Final    <10,000 COLONIES/mL INSIGNIFICANT GROWTH Performed at [place] [address], [place], [place] [address]    Report Status 12/25/2020 FINAL  Final  Resp Panel by RT-PCR (Flu A&B, Covid) Nasopharyngeal Swab     Status: None   Collection Time: 12/22/20 10:17 PM   Specimen: Nasopharyngeal Swab; Nasopharyngeal(NP) swabs in vial transport medium  Result Value Ref Range Status   SARS Coronavirus 2 by RT PCR NEGATIVE NEGATIVE Final    Comment: (NOTE) SARS-CoV-2 target nucleic acids are NOT DETECTED.  The SARS-CoV-2 RNA is generally detectable in upper respiratory specimens during the acute phase of infection. The lowest concentration of SARS-CoV-2 viral copies this assay can detect is 138 copies/mL. A negative result does not preclude SARS-Cov-2 infection and should not be used as the sole basis for treatment or other patient management decisions. A negative result may  occur with  improper specimen collection/handling, submission of specimen other than nasopharyngeal swab, presence of viral mutation(s) within the areas targeted by this assay, and inadequate number of viral copies(<138 copies/mL). A negative result must be combined with clinical observations, patient history, and epidemiological information. The expected result is Negative.  Fact Sheet for Patients:  EntrepreneurPulse.com.au  Fact Sheet for Healthcare Providers:  IncredibleEmployment.be  This test is no t yet approved or cleared by the [place] FDA and  has been authorized for detection and/or diagnosis of SARS-CoV-2 by FDA under an Emergency Use Authorization (EUA). This EUA will remain  in effect (meaning this test can be used) for the duration  of the COVID-19 declaration under Section 564(b)(1) of the Act, 21 U.S.C.section 360bbb-3(b)(1), unless the authorization is terminated  or revoked sooner.       Influenza A by PCR NEGATIVE NEGATIVE Final   Influenza B by PCR NEGATIVE NEGATIVE Final    Comment: (NOTE) The Xpert Xpress SARS-CoV-2/FLU/RSV plus assay is intended as an aid in the diagnosis of influenza from Nasopharyngeal swab specimens and should not be used as a sole basis for treatment. Nasal washings and aspirates are unacceptable for Xpert Xpress SARS-CoV-2/FLU/RSV testing.  Fact Sheet for Patients: EntrepreneurPulse.com.au  Fact Sheet for Healthcare Providers: IncredibleEmployment.be  This test is not yet approved or cleared by the [place] FDA and has been authorized for detection and/or diagnosis of SARS-CoV-2 by FDA under an Emergency Use Authorization (EUA). This EUA will remain in effect (meaning this test can be used) for the duration of the COVID-19 declaration under Section 564(b)(1) of the Act, 21 U.S.C. section 360bbb-3(b)(1), unless the authorization is terminated  or revoked.  Performed at [facility], [place] [address], [place], [place] [address]   Urine Culture     Status: None   Collection Time: 12/28/20  2:35 PM   Specimen: In/Out Cath Urine  Result Value Ref Range Status   Specimen Description   Final    IN/OUT CATH URINE Performed at [place] [address], [place], [place] [address]    Special Requests   Final    NONE Performed at [facility], [place] [address] [place], [place] [address]    Culture   Final    NO GROWTH Performed at [facility], [place] [address], [place], [place] [address]    Report Status 12/30/2020 FINAL  Final  Culture, blood (routine x 2)     Status: None (Preliminary result)   Collection Time: 12/28/20  3:06 PM   Specimen: BLOOD  Result Value Ref Range Status   Specimen Description   Final    BLOOD LEFT ANTECUBITAL Performed at [place] [address], [place], [place] [address]    Special Requests   Final    BOTTLES DRAWN AEROBIC ONLY Blood Culture results may not be optimal due to an inadequate volume of blood received in culture bottles Performed at [place] [address] [place], [place] [address]    Culture   Final    NO GROWTH 3 DAYS Performed at [facility], [place] [address] [place], [place] [address]    Report Status PENDING  Incomplete  Culture, blood (routine x 2)     Status: None (Preliminary result)   Collection Time: 12/28/20  3:06 PM   Specimen: BLOOD  Result Value Ref Range Status   Specimen Description   Final    BLOOD BLOOD LEFT WRIST Performed at [place] [address] [place], [place] [address]    Special Requests   Final    BOTTLES DRAWN AEROBIC ONLY Blood Culture results may not be optimal due to an inadequate volume of blood received in culture bottles Performed at [place] [address] [place], [place]  [address]    Culture   Final    NO GROWTH 3 DAYS Performed at [facility], [place] [address] [place],  [address]    Report Status PENDING  Incomplete  MRSA Next Gen by PCR, Nasal     Status: None   Collection Time: 12/28/20  8:18 PM   Specimen: Nasal Mucosa; Nasal Swab  Result Value Ref Range Status   MRSA by PCR Next Gen NOT DETECTED NOT DETECTED Final    Comment: (NOTE) The GeneXpert MRSA Assay (FDA approved for NASAL specimens only), is one component of a comprehensive MRSA colonization surveillance program. It is not intended to diagnose MRSA infection nor to guide or monitor treatment for MRSA infections. Test performance is not FDA approved in patients less than 19 years old. Performed at [facility], [place] [address], [place], [place] [address]      Studies: No results found.   Kayleen Memos, MD  [facility] 12/31/2020  If 7PM-7AM, please contact night-coverage

## 2020-12-31 NOTE — Progress Notes (Signed)
[facility] Gastroenterology Progress Note  NEGAN GRUDZIEN 80 y.o. [birth year]   Subjective: Sitting up in bed. Oriented to person but not oriented to place or time. Wondering why he is in the hospital.  Objective: Vital signs: Vitals:   12/30/20 1942 12/31/20 0610  BP: (!) 117/93 127/84  Pulse: (!) 59 (!) 55  Resp: 18 16  Temp: 97.6 F (36.4 C) 97.6 F (36.4 C)  SpO2: 100%     Physical Exam: Gen: elderly, cachetic, disheveled, chronically ill-appearing, no acute distress  HEENT: opaque eyes CV: RRR Chest: CTA B Abd: soft, nontender, nondistended, +BS Ext: no edema  Lab Results: Recent Labs    12/30/20 0311 12/31/20 0549  NA 142 140  K 4.3 3.7  CL 109 107  CO2 24 25  GLUCOSE 79 76  BUN 40* 41*  CREATININE 1.90* 1.78*  CALCIUM 9.0 8.8*   Recent Labs    12/30/20 0311 12/31/20 0549  AST 70* 59*  ALT 45* 41  ALKPHOS 152* 153*  BILITOT 2.9* 2.8*  PROT 7.1 7.0  ALBUMIN 2.2* 2.2*   Recent Labs    12/30/20 0311 12/31/20 0549  WBC 4.0 3.8*  NEUTROABS 3.4 3.2  HGB 10.0* 9.5*  HCT 30.2* 28.7*  MCV 84.6 84.4  PLT 95* 110*      Assessment/Plan: Decompensated cirrhosis with altered mental status that is likely multi-factorial in origin. Unclear what is his baseline mental status. Continue Lactulose. Prognosis poor. Will sign off. Call if questions. F/U with GI prn.   Lear Ng 12/31/2020, 11:45 AM  Questions please call [phone] Patient ID: JUN OSMENT, male   DOB: 21-Sep-1940, 80 y.o.   MRN: [id number]

## 2021-01-01 DIAGNOSIS — I3139 Other pericardial effusion (noninflammatory): Secondary | ICD-10-CM | POA: Diagnosis not present

## 2021-01-01 LAB — COMPREHENSIVE METABOLIC PANEL
ALT: 40 U/L (ref 0–44)
AST: 57 U/L — ABNORMAL HIGH (ref 15–41)
Albumin: 2 g/dL — ABNORMAL LOW (ref 3.5–5.0)
Alkaline Phosphatase: 159 U/L — ABNORMAL HIGH (ref 38–126)
Anion gap: 7 (ref 5–15)
BUN: 39 mg/dL — ABNORMAL HIGH (ref 8–23)
CO2: 26 mmol/L (ref 22–32)
Calcium: 8.8 mg/dL — ABNORMAL LOW (ref 8.9–10.3)
Chloride: 105 mmol/L (ref 98–111)
Creatinine, Ser: 1.33 mg/dL — ABNORMAL HIGH (ref 0.61–1.24)
GFR, Estimated: 53 mL/min — ABNORMAL LOW (ref >60)
Glucose, Bld: 80 mg/dL (ref 70–99)
Potassium: 3.8 mmol/L (ref 3.5–5.1)
Sodium: 138 mmol/L (ref 135–145)
Total Bilirubin: 2.3 mg/dL — ABNORMAL HIGH (ref 0.3–1.2)
Total Protein: 6.9 g/dL (ref 6.5–8.1)

## 2021-01-01 LAB — CBC WITH DIFFERENTIAL/PLATELET
Abs Immature Granulocytes: 0.01 10*3/uL (ref 0.00–0.07)
Basophils Absolute: 0 10*3/uL (ref 0.0–0.1)
Basophils Relative: 0 %
Eosinophils Absolute: 0.1 10*3/uL (ref 0.0–0.5)
Eosinophils Relative: 3 %
HCT: 28.6 % — ABNORMAL LOW (ref 39.0–52.0)
Hemoglobin: 9.5 g/dL — ABNORMAL LOW (ref 13.0–17.0)
Immature Granulocytes: 0 %
Lymphocytes Relative: 12 %
Lymphs Abs: 0.3 10*3/uL — ABNORMAL LOW (ref 0.7–4.0)
MCH: 27.9 pg (ref 26.0–34.0)
MCHC: 33.2 g/dL (ref 30.0–36.0)
MCV: 84.1 fL (ref 80.0–100.0)
Monocytes Absolute: 0.2 10*3/uL (ref 0.1–1.0)
Monocytes Relative: 5 %
Neutro Abs: 2.2 10*3/uL (ref 1.7–7.7)
Neutrophils Relative %: 80 %
Platelets: 110 10*3/uL — ABNORMAL LOW (ref 150–400)
RBC: 3.4 MIL/uL — ABNORMAL LOW (ref 4.22–5.81)
RDW: 20 % — ABNORMAL HIGH (ref 11.5–15.5)
WBC: 2.8 10*3/uL — ABNORMAL LOW (ref 4.0–10.5)
nRBC: 4.3 % — ABNORMAL HIGH (ref 0.0–0.2)

## 2021-01-01 LAB — PROTIME-INR
INR: 1.3 — ABNORMAL HIGH (ref 0.8–1.2)
Prothrombin Time: 16.4 seconds — ABNORMAL HIGH (ref 11.4–15.2)

## 2021-01-01 NOTE — Progress Notes (Signed)
PROGRESS NOTE  Tommy Morris:[id number] DOB: [birth year] DOA: 12/22/2020 PCP: Axel Filler, MD   LOS: 10 days   Brief narrative: Tommy Morris is a 80 y.o. male with medical history significant for remote history of bladder cancer s/p transurethral resection in 7672, chronic systolic heart failure, atrial fibrillation and hypertension presented to the hospital with generalized weakness.  Patient lives by himself at home and family had checked on him.  For the last 3 weeks, patient has been complaining of dizziness and difficulty with ambulation with decreased oral intake.  Patient does have history of alcohol consumption.  In the ED, patient was noted to have temperature of 99.2 F. Potassium was slightly elevated at 5.7.  Creatinine 1.6 from prior 0.9.  AST was elevated at 93 ALT of 42.  Total bilirubin of 4.4.  CK level was 919.  TSH of 5.8.  Chest x-ray showed a concerning for pericardial effusion.  Troponin was elevated at 40.  Bedside ultrasound was obtained in the ED which showed pericardial effusion.  Cardiology was consulted and patient was admitted to hospital with further evaluation and treatment.  01/01/21: Seen at his bedside.  No acute events overnight.  He reports no complaints.     Assessment/Plan:  Principal Problem:   Pericardial effusion Active Problems:   Anemia   Alcohol use disorder   Essential hypertension   Atrial fibrillation (HCC)   Chronic systolic heart failure (HCC)   Elevated bilirubin   AKI (acute kidney injury) ([place])   Resolved acute metabolic encephalopathy- More awake Resolved hypothermia- ??sepsis Noted to be somewhat lethargic on 12/28/20, but able to follow commands and answer simple questions CT head with hypodensity in the pons and right cerebellar MRI brain no acute intracranial abnormality Patient noted to be persistently hypothermic, despite warm blankets and increasing temp in room, concern for possible ??sepsis, transferred to  SDU on 12/28/20 Sepsis work-up ordered, UA turbid urine, many bacteria, greater than 50 WBC UC no growth, BC X 2 NGTD Chest x-ray unremarkable for any infection Received IV Rocephin X 3, d/c'd as no source of infection  Hyperbilirubinemia/Elevated liver enzymes 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 Avoid nephrotoxic agents, dehydration and hypotension.  Chronic systolic HF  Likely nonischemic from alcohol, hypertension  Appears euvolemic, difficult to assess BNP 1,654 Echo with EF 35 to 40%, global hypokinesis Cardiology consulted, might ideally be on hydralazine and nitroglycerin which can be initiated as outpatient as per cardiology Gave 1 dose of lasix on 12/30/20, may consider another dose pending Cr Hold home Toprol Xl for now due to soft Bps Not on ARB due to soft BPs and AKI.  Generalized weakness/failure to thrive Physical therapy has seen the patient and recommend skilled nursing facility placement at this time Continue thiamine, folic acid.    Poor output and oral intake Diminished oral intake and poor output.  Encourage oral intake  Pericardial effusion  Could be secondary to CHF.  No evidence of hemodynamic instability.  Flu and COVID was negative. TSH elevated at 5.85 but free T4 within normal  range.  EKG with atrial fibrillation with T wave inversion.  2D echocardiogram shows reduced LV function at 35 to 40% with global left ventricular hypokinesis and diastolic  dysfunction Cardiology on consulted, no further recs, signed off  Moderate to severe mitral regurgitation Cardiology consulted, poor candidate for any surgical intervention  Essential hypertension BP currently soft, hold home amlodipine for now  Anemia-normocytic Iron panel with low iron at 18 and saturation was low as well Vitamin B12 elevated.  Folic acid within normal range Oral iron supplementation   Alcohol use, indistinct speech possible mild metabolic encephalopathy Possibility of underlying dementia from alcohol or Alzheimer's with possible encephalopathy from medication, could not rule out component of Wernicke's encephalopathy Continue high-dose IV thiamine 500 mg IV 3 times daily for 2 days, followed by 250 mg IV daily for next 5 days Continue, multivitamin and folic acid Continue lactulose for adequate bowel movement  P. Atrial fibrillation Has declined anticoagulation in the past Heart rate is controlled Hold Coreg for now due to soft BP  Ethics/goals of care.  Patient with multiple comorbidities at advanced age with ongoing alcohol use and encephalopathy.  Appears to have overall poor to limited prognosis Palliative care consultation for discussion about goals of care-plan for SNF with palliative Patient's daughter feels like patient likely has underlying baseline dementia    DVT prophylaxis: enoxaparin (LOVENOX) injection 40 mg Start: 12/31/20 2200  Code Status: Full code  Family Communication:  None at bedside  Status is: Inpatient  Remains inpatient appropriate because: generalized weakness, related to thrive, prolonged alcohol use disorder, pericardial effusion, deconditioning, encephalopathy, need for skilled nursing facility placement.  Consultants: Cardiology Palliative care GI  Procedures: None  Anti-infectives:  None  Anti-infectives (From admission, onward)    Start     Dose/Rate Route Frequency Ordered Stop   12/28/20 1930   cefTRIAXone (ROCEPHIN) 1 g in sodium chloride 0.9 % 100 mL IVPB  Status:  Discontinued        1 g 200 mL/hr over 30 Minutes Intravenous Every 24 hours 12/28/20 1835 12/30/20 1719      Subjective:  Patient denies any new complaints.  Still with poor urine output.   Objective: Vitals:   12/31/20 2051 01/01/21 0443  BP:  112/89  Pulse: 60 65  Resp:  18  Temp:  (!) 97.3 F (36.3 C)  SpO2: 100% 97%    Intake/Output Summary (Last 24 hours) at 01/01/2021 1328 Last data filed at 01/01/2021 0445 Gross per 24 hour  Intake 100 ml  Output 950 ml  Net -850 ml   Filed Weights   12/22/20 1807  Weight: 69 kg   Body mass index is 20.07 kg/m.     Physical Exam: General: Frail-appearing no acute distress.  He is alert and interactive. Cardiovascular: Regular rate and rhythm no rubs or gallops. Respiratory: Clear to auscultation no wheezes or rales.  Abdomen: Soft nontender normal bowel sounds present. Musculoskeletal: No lower extremity edema bilaterally.   Skin: No ulcerative lesions noted. Psychiatry: Mood is appropriate for condition and setting.    Data Review: I have personally reviewed the following laboratory data and studies,  CBC: Recent Labs  Lab 12/28/20 0356 12/29/20 0251 12/30/20 0311 12/31/20 0549 01/01/21 0606  WBC 4.4 5.0 4.0 3.8* 2.8*  NEUTROABS  --  4.4 3.4 3.2 2.2  HGB 9.9* 9.0* 10.0* 9.5* 9.5*  HCT 30.6* 28.4* 30.2* 28.7* 28.6*  MCV 87.2 86.6 84.6 84.4 84.1  PLT 115* 99* 95* 110* 110*  Basic Metabolic Panel: Recent Labs  Lab 12/26/20 0354 12/27/20 0406 12/28/20 0356 12/29/20 0251 12/30/20 0311 12/31/20 0549 01/01/21 0606  NA 141 143 142 143 142 140 138  K 4.2 4.2 4.3 4.1 4.3 3.7 3.8  CL 112* 110 109 112* 109 107 105  CO2 24 25 24 23 24 25 26   GLUCOSE 109* 110* 117* 91 79 76 80  BUN 27* 28* 36* 39* 40* 41* 39*  CREATININE 1.14 1.35* 1.79* 1.85* 1.90* 1.78* 1.33*  CALCIUM 8.3* 8.8* 8.9 8.9 9.0 8.8* 8.8*  MG 1.9 1.9 2.2  --   --   --    --    Liver Function Tests: Recent Labs  Lab 12/28/20 0356 12/29/20 0251 12/30/20 0311 12/31/20 0549 01/01/21 0606  AST 76* 69* 70* 59* 57*  ALT 44 43 45* 41 40  ALKPHOS 127* 136* 152* 153* 159*  BILITOT 2.7* 2.2* 2.9* 2.8* 2.3*  PROT 7.0 6.7 7.1 7.0 6.9  ALBUMIN 2.2* 2.1* 2.2* 2.2* 2.0*   No results for input(s): LIPASE, AMYLASE in the last 168 hours. Recent Labs  Lab 12/26/20 1132 12/29/20 1703  AMMONIA 29 34   Cardiac Enzymes: Recent Labs  Lab 12/31/20 0549  CKTOTAL 89    BNP (last 3 results) Recent Labs    07/04/20 1032 12/30/20 0311  BNP 558.9* 1,654.9*    ProBNP (last 3 results) No results for input(s): PROBNP in the last 8760 hours.  CBG: Recent Labs  Lab 12/28/20 1312 12/29/20 0747  GLUCAP 121* 85   Recent Results (from the past 240 hour(s))  Urine Culture     Status: Abnormal   Collection Time: 12/22/20  6:17 PM   Specimen: Urine, Clean Catch  Result Value Ref Range Status   Specimen Description   Final    URINE, CLEAN CATCH Performed at [facility], [place] [address], [place], [place] [address]    Special Requests   Final    NONE Performed at [facility], [place] [address] [place], [place] [address]    Culture (A)  Final    <10,000 COLONIES/mL INSIGNIFICANT GROWTH Performed at [facility] [address] [place], [place] [address]    Report Status 12/25/2020 FINAL  Final  Resp Panel by RT-PCR (Flu A&B, Covid) Nasopharyngeal Swab     Status: None   Collection Time: 12/22/20 10:17 PM   Specimen: Nasopharyngeal Swab; Nasopharyngeal(NP) swabs in vial transport medium  Result Value Ref Range Status   SARS Coronavirus 2 by RT PCR NEGATIVE NEGATIVE Final    Comment: (NOTE) SARS-CoV-2 target nucleic acids are NOT DETECTED.  The SARS-CoV-2 RNA is generally detectable in upper respiratory specimens during the acute phase of infection. The lowest concentration of SARS-CoV-2 viral copies this assay  can detect is 138 copies/mL. A negative result does not preclude SARS-Cov-2 infection and should not be used as the sole basis for treatment or other patient management decisions. A negative result may occur with  improper specimen collection/handling, submission of specimen other than nasopharyngeal swab, presence of viral mutation(s) within the areas targeted by this assay, and inadequate number of viral copies(<138 copies/mL). A negative result must be combined with clinical observations, patient history, and epidemiological information. The expected result is Negative.  Fact Sheet for Patients:  EntrepreneurPulse.com.au  Fact Sheet for Healthcare Providers:  IncredibleEmployment.be  This test is no t yet approved or cleared by the [place] FDA and  has been authorized for detection and/or diagnosis of SARS-CoV-2  by FDA under an Emergency Use Authorization (EUA). This EUA will remain  in effect (meaning this test can be used) for the duration of the COVID-19 declaration under Section 564(b)(1) of the Act, 21 U.S.C.section 360bbb-3(b)(1), unless the authorization is terminated  or revoked sooner.       Influenza A by PCR NEGATIVE NEGATIVE Final   Influenza B by PCR NEGATIVE NEGATIVE Final    Comment: (NOTE) The Xpert Xpress SARS-CoV-2/FLU/RSV plus assay is intended as an aid in the diagnosis of influenza from Nasopharyngeal swab specimens and should not be used as a sole basis for treatment. Nasal washings and aspirates are unacceptable for Xpert Xpress SARS-CoV-2/FLU/RSV testing.  Fact Sheet for Patients: EntrepreneurPulse.com.au  Fact Sheet for Healthcare Providers: IncredibleEmployment.be  This test is not yet approved or cleared by the [place] FDA and has been authorized for detection and/or diagnosis of SARS-CoV-2 by FDA under an Emergency Use Authorization (EUA). This EUA will  remain in effect (meaning this test can be used) for the duration of the COVID-19 declaration under Section 564(b)(1) of the Act, 21 U.S.C. section 360bbb-3(b)(1), unless the authorization is terminated or revoked.  Performed at [facility], [place] [address], [place], [place] [address]   Urine Culture     Status: None   Collection Time: 12/28/20  2:35 PM   Specimen: In/Out Cath Urine  Result Value Ref Range Status   Specimen Description   Final    IN/OUT CATH URINE Performed at [facility] [address] [place], [place] [address]    Special Requests   Final    NONE Performed at [facility], [place] [address] [place], [place] [address]    Culture   Final    NO GROWTH Performed at [facility], [place] [address], [place], [place] [address]    Report Status 12/30/2020 FINAL  Final  Culture, blood (routine x 2)     Status: None (Preliminary result)   Collection Time: 12/28/20  3:06 PM   Specimen: BLOOD  Result Value Ref Range Status   Specimen Description   Final    BLOOD LEFT ANTECUBITAL Performed at [facility] [address], [place], [place] [address]    Special Requests   Final    BOTTLES DRAWN AEROBIC ONLY Blood Culture results may not be optimal due to an inadequate volume of blood received in culture bottles Performed at [facility] [address], [place], [place] [address]    Culture   Final    NO GROWTH 4 DAYS Performed at [facility], [place] [address], [place], [place] [address]    Report Status PENDING  Incomplete  Culture, blood (routine x 2)     Status: None (Preliminary result)   Collection Time: 12/28/20  3:06 PM   Specimen: BLOOD  Result Value Ref Range Status   Specimen Description   Final    BLOOD BLOOD LEFT WRIST Performed at [facility] [address], [place], [place] [address]    Special Requests   Final    BOTTLES  DRAWN AEROBIC ONLY Blood Culture results may not be optimal due to an inadequate volume of blood received in culture bottles Performed at [facility] [address] [place],  [address]    Culture   Final    NO GROWTH 4 DAYS Performed at [facility], [place] [address], [place],  [address]  Report Status PENDING  Incomplete  MRSA Next Gen by PCR, Nasal     Status: None   Collection Time: 12/28/20  8:18 PM   Specimen: Nasal Mucosa; Nasal Swab  Result Value Ref Range Status   MRSA by PCR Next Gen NOT DETECTED NOT DETECTED Final    Comment: (NOTE) The GeneXpert MRSA Assay (FDA approved for NASAL specimens only), is one component of a comprehensive MRSA colonization surveillance program. It is not intended to diagnose MRSA infection nor to guide or monitor treatment for MRSA infections. Test performance is not FDA approved in patients less than 78 years old. Performed at [facility], [place] [address], [place], [place] [address]      Studies: No results found.   Kayleen Memos, MD  [facility] 01/01/2021  If 7PM-7AM, please contact night-coverage

## 2021-01-01 NOTE — Progress Notes (Signed)
Pharmacist Heart Failure Core Measure Documentation  Assessment: Tommy Morris has an EF documented as 35-40% on 12/23/20 by ECHO.  Rationale: Heart failure patients with left ventricular systolic dysfunction (LVSD) and an EF < 40% should be prescribed an angiotensin converting enzyme inhibitor (ACEI) or angiotensin receptor blocker (ARB) at discharge unless a contraindication is documented in the medical record.  This patient is not currently on an ACEI or ARB for HF.  This note is being placed in the record in order to provide documentation that a contraindication to the use of these agents is present for this encounter.  ACE Inhibitor or Angiotensin Receptor Blocker is contraindicated (specify all that apply)  []   ACEI allergy AND ARB allergy []   Angioedema []   Moderate or severe aortic stenosis []   Hyperkalemia []   Hypotension []   Renal artery stenosis [x]   Worsening renal function, preexisting renal disease or dysfunction -Continue to monitor renal function and can consider adding ACEi/ARB once stabilized  Dimple Nanas, PharmD 01/01/2021 1:09 PM

## 2021-01-02 ENCOUNTER — Encounter: Payer: Medicare Other | Admitting: Student in an Organized Health Care Education/Training Program

## 2021-01-02 DIAGNOSIS — I3139 Other pericardial effusion (noninflammatory): Secondary | ICD-10-CM | POA: Diagnosis not present

## 2021-01-02 LAB — CBC WITH DIFFERENTIAL/PLATELET
Abs Immature Granulocytes: 0 10*3/uL (ref 0.00–0.07)
Basophils Absolute: 0 10*3/uL (ref 0.0–0.1)
Basophils Relative: 0 %
Eosinophils Absolute: 0.1 10*3/uL (ref 0.0–0.5)
Eosinophils Relative: 3 %
HCT: 27.4 % — ABNORMAL LOW (ref 39.0–52.0)
Hemoglobin: 9.3 g/dL — ABNORMAL LOW (ref 13.0–17.0)
Immature Granulocytes: 0 %
Lymphocytes Relative: 11 %
Lymphs Abs: 0.3 10*3/uL — ABNORMAL LOW (ref 0.7–4.0)
MCH: 28.2 pg (ref 26.0–34.0)
MCHC: 33.9 g/dL (ref 30.0–36.0)
MCV: 83 fL (ref 80.0–100.0)
Monocytes Absolute: 0.2 10*3/uL (ref 0.1–1.0)
Monocytes Relative: 6 %
Neutro Abs: 2.1 10*3/uL (ref 1.7–7.7)
Neutrophils Relative %: 80 %
Platelets: 112 10*3/uL — ABNORMAL LOW (ref 150–400)
RBC: 3.3 MIL/uL — ABNORMAL LOW (ref 4.22–5.81)
RDW: 20.6 % — ABNORMAL HIGH (ref 11.5–15.5)
WBC: 2.7 10*3/uL — ABNORMAL LOW (ref 4.0–10.5)
nRBC: 2.2 % — ABNORMAL HIGH (ref 0.0–0.2)

## 2021-01-02 LAB — COMPREHENSIVE METABOLIC PANEL
ALT: 42 U/L (ref 0–44)
AST: 60 U/L — ABNORMAL HIGH (ref 15–41)
Albumin: 2.1 g/dL — ABNORMAL LOW (ref 3.5–5.0)
Alkaline Phosphatase: 154 U/L — ABNORMAL HIGH (ref 38–126)
Anion gap: 8 (ref 5–15)
BUN: 34 mg/dL — ABNORMAL HIGH (ref 8–23)
CO2: 25 mmol/L (ref 22–32)
Calcium: 8.7 mg/dL — ABNORMAL LOW (ref 8.9–10.3)
Chloride: 104 mmol/L (ref 98–111)
Creatinine, Ser: 1.14 mg/dL (ref 0.61–1.24)
GFR, Estimated: >60 mL/min (ref >60)
Glucose, Bld: 76 mg/dL (ref 70–99)
Potassium: 3.9 mmol/L (ref 3.5–5.1)
Sodium: 137 mmol/L (ref 135–145)
Total Bilirubin: 2.6 mg/dL — ABNORMAL HIGH (ref 0.3–1.2)
Total Protein: 6.6 g/dL (ref 6.5–8.1)

## 2021-01-02 LAB — CULTURE, BLOOD (ROUTINE X 2)
Culture: NO GROWTH
Culture: NO GROWTH

## 2021-01-02 LAB — RESP PANEL BY RT-PCR (FLU A&B, COVID) ARPGX2
Influenza A by PCR: NEGATIVE
Influenza B by PCR: NEGATIVE
SARS Coronavirus 2 by RT PCR: NEGATIVE

## 2021-01-02 LAB — PROTIME-INR
INR: 1.3 — ABNORMAL HIGH (ref 0.8–1.2)
Prothrombin Time: 16 seconds — ABNORMAL HIGH (ref 11.4–15.2)

## 2021-01-02 MED ORDER — FOLIC ACID 1 MG PO TABS: 1.0000 mg | ORAL_TABLET | Freq: Every day | ORAL | 0 refills | Status: AC

## 2021-01-02 MED ORDER — POTASSIUM CHLORIDE CRYS ER 10 MEQ PO TBCR
10.0000 meq | EXTENDED_RELEASE_TABLET | Freq: Every day | ORAL | 0 refills | Status: AC
Start: 2021-01-02 — End: 2021-02-01

## 2021-01-02 MED ORDER — LACTULOSE 10 GM/15ML PO SOLN: 10.0000 g | Freq: Two times a day (BID) | ORAL | 0 refills | Status: AC

## 2021-01-02 MED ORDER — FERROUS SULFATE 325 (65 FE) MG PO TABS: 325.0000 mg | ORAL_TABLET | Freq: Every day | ORAL | 0 refills | Status: DC

## 2021-01-02 MED ORDER — ADULT MULTIVITAMIN W/MINERALS CH: 1.0000 | ORAL_TABLET | Freq: Every day | ORAL | 0 refills | Status: AC

## 2021-01-02 MED ORDER — FUROSEMIDE 20 MG PO TABS
20.0000 mg | ORAL_TABLET | Freq: Two times a day (BID) | ORAL | Status: DC
  Administered 2021-01-02 – 2021-01-04 (×6): 20 mg via ORAL
  Filled 2021-01-02 (×6): qty 1

## 2021-01-02 MED ORDER — ENSURE ENLIVE PO LIQD: 237.0000 mL | Freq: Three times a day (TID) | ORAL | 0 refills | Status: AC

## 2021-01-02 MED ORDER — FERROUS SULFATE 325 (65 FE) MG PO TABS
325.0000 mg | ORAL_TABLET | Freq: Every day | ORAL | 0 refills | Status: AC
Start: 2021-01-03 — End: 2021-04-03

## 2021-01-02 MED ORDER — POTASSIUM CHLORIDE CRYS ER 10 MEQ PO TBCR
10.0000 meq | EXTENDED_RELEASE_TABLET | Freq: Every day | ORAL | Status: DC
  Administered 2021-01-02 – 2021-01-04 (×3): 10 meq via ORAL
  Filled 2021-01-02 (×4): qty 1

## 2021-01-02 NOTE — Plan of Care (Signed)

## 2021-01-02 NOTE — Care Management Important Message (Signed)
Important Message  Patient Details IM Letter placed in Patients room. Name: Tommy Morris MRN: [id number] Date of Birth: 1940/08/15   Medicare Important Message Given:  Yes     Kerin Salen 01/02/2021, 11:15 AM

## 2021-01-02 NOTE — TOC Progression Note (Signed)
Transition of Care [facility]) - Progression Note    Patient Details  Name: Tommy Morris MRN: [id number] Date of Birth: 1940-06-29  [facility] CM/SW Contact  Ross Ludwig, [place] Phone Number: 01/02/2021, 5:06 PM  Clinical Narrative:     CSW began expedited appeal process due to insurance company denying patient previously on 12/29/2020.  Insurance company did not have most current FL2 or therapy notes.  CSW waiting for decision by insurance company.        Expected Discharge Plan and Services           Expected Discharge Date: 01/02/21                                     Social Determinants of Health (SDOH) Interventions    Readmission Risk Interventions No flowsheet data found.

## 2021-01-02 NOTE — TOC Progression Note (Addendum)
Transition of Care [facility]) - Progression Note    Patient Details  Name: Tommy Morris MRN: [id number] Date of Birth: 16-Apr-1940  Transition of [facility] CM/SW Contact  Ross Ludwig, [place] Phone Number: 01/02/2021, 10:57 AM  Clinical Narrative:    Patient has not been approved for SNF yet, and does not have any bed offers at this time.  CSW requested PT to see patient for updated PT notes.  CSW also resent clinicals to SNFs looking for short term rehab placement.  3:15pm  CSW spoke to patient's daughter Davy Pique, [phone] and provided bed offers, she chose [facility].  CSW called [facility], and they confirmed if patient gets auth, they can accept him.  CSW started Lexicographer number [phone].  Josem Kaufmann is still pending.    4:45pm  CSW contacted [employer] expedited hotline [phone] on behalf of patient to complete an expedited appeal in regards to patient needing SNF at [facility].  Clinicals faxed to Bernadene Bell Expedited appeal hotline [phone]. Reference number [id number].  TOC to continue to follow patient's progress throughout discharge planning.      Expected Discharge Plan and Services  SNF for short term rehab.         Expected Discharge Date: 01/02/21                                     Social Determinants of Health (SDOH) Interventions    Readmission Risk Interventions No flowsheet data found.

## 2021-01-02 NOTE — Discharge Summary (Signed)
Discharge Summary  Tommy Morris KVQ:[id number] DOB: 1940/06/21  PCP: Axel Filler, MD  Admit date: 12/22/2020 Discharge date: 01/02/2021  Time spent: 35 minutes  Recommendations for Outpatient Follow-up:  Follow-up with GI Follow-up with your primary care provider Follow-up with cardiology Follow-up with palliative care outpatient. Take your medications as prescribed Continue PT OT with assistance and fall precautions.  Discharge Diagnoses:  Active Hospital Problems   Diagnosis Date Noted   Pericardial effusion 12/22/2020   AKI (acute kidney injury) ([place]) 12/22/2020   Elevated bilirubin 64/33/2951   Chronic systolic heart failure ([place]) 03/28/2015   Alcohol use disorder 05/04/2009   Atrial fibrillation ([place]) 04/27/2009   Anemia 04/24/2009   Essential hypertension 02/01/2006    Resolved Hospital Problems  No resolved problems to display.    Discharge Condition: Stable  Diet recommendation: Resume previous diet.  Vitals:   01/01/21 2114 01/02/21 0420  BP: 113/80 115/79  Pulse: 78 94  Resp: 16 20  Temp: 97.9 F (36.6 C) 98.5 F (36.9 C)  SpO2: 95% 100%    History of present illness:  Tommy Morris is a 80 y.o. male with medical history significant for remote history of bladder cancer s/p transurethral resection in 2014, alcohol use disorder, chronic systolic CHF, chronic atrial fibrillation not on anticoagulation, mitral regurgitation, and hypertension who presented to [facility] from home with generalized weakness and confusion.  Associated with decreased oral intake, dizziness and difficulty with ambulation.    Upon presentation to the ED, chest x-ray concerning for pericardial effusion, elevated LFTs, elevated INR, cirrhotic morphology of the liver with ascites in the right upper quadrant seen on right upper quadrant abdominal ultrasound done on 12/23/2019.  Patient was seen by cardiology and GI and interventional radiology, signed off.  Paracentesis could not be  done by IR due to not enough ascites fluid.  Patient was evaluated by PT OT recommendation for SNF.  TOC assisting with SNF placement.   01/02/21: Seen at his bedside.  There were no acute events overnight.  He has no complaints.  Hospital Course:  Principal Problem:   Pericardial effusion Active Problems:   Anemia   Alcohol use disorder   Essential hypertension   Atrial fibrillation (HCC)   Chronic systolic heart failure (HCC)   Elevated bilirubin   AKI (acute kidney injury) ([place])  Resolved acute metabolic encephalopathy- More awake Resolved hypothermia- Noted to be somewhat lethargic on 12/28/20, but able to follow commands and answer simple questions CT head with hypodensity in the pons and right cerebellar MRI brain no acute intracranial abnormality Urine culture no growth. Blood cultures negative to date. Chest x-ray nonacute. 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 Right upper quadrant ultrasound showing cirrhotic liver with ascites (ascites noted to be minimal, not enough to drain by interventional radiology) [facility] consulted, trial of lactulose, otherwise no further recommendation Continue lactulose. Follow-up with GI outpatient.  Coagulopathy in the setting of decompensated cirrhosis INR 1.4 No overt bleeding. Follow-up with GI.  Decompensated cirrhosis Elevated LFTs Recent AMS, resolved, continue lactulose Elevated INR Meld score 21 Completely abstain from alcohol use Completely abstain from hepatotoxic agents Salt restriction 2 g/day Continue p.o. Lasix 20 mg twice daily with potassium replacement Follow-up with GI   Resolved nonoliguric AKI, improving with Elevated CK Creatinine levels were elevated at 1.6  Baseline creatinine 1.1 with GFR greater  than 60 Back to his baseline  creatinine Continue to monitor toxic agents and hypotension.    Chronic systolic HF  Likely nonischemic from alcohol, hypertension  Appears euvolemic, difficult to assess BNP 1,654 Echo with EF 35 to 40%, global hypokinesis Cardiology consulted, might ideally be on hydralazine and nitroglycerin which can be initiated as outpatient as per cardiology Resume home p.o. Lasix 20 mg twice daily with potassium replacement. Continue to hold off home Toprol Xl due to soft Bps Not on ARB due to soft BPs and AKI.  Moderate to severe mitral valve regurgitation Seen by cardiology Management per cardiology   Generalized weakness/failure to thrive Physical therapy has seen the patient and recommend skilled nursing facility placement at this time Continue thiamine, folic acid, multivitamins.  Continue to encourage increase in oral protein calorie intake. Continue oral supplement   Pericardial effusion, suspected secondary to CHF Seen by cardiology, signed off. Continue home diuretics Follow-up with cardiology outpatient  Moderate to severe mitral regurgitation Cardiology consulted, poor candidate for any surgical intervention   Essential hypertension BP currently soft Hold off home oral antihypertensives except for p.o. Lasix 20 mg twice daily Continue to monitor vital signs   Pancytopenia in the setting of cirrhosis Continue iron supplement, vitamin M08 supplement, folic acid supplement. Follow-up with your PCP  Alcohol use disorder  Received high-dose IV thiamine. Continue p.o. thiamine, multivitamin, folic acid supplement.   Chronic atrial fibrillation CHA2DS2-VASc of 4 Has declined anticoagulation in the past Heart rate is controlled Hold Coreg for now due to soft BPs to avoid hypotension   Ethics/goals of care.  Currently full code Recommend follow-up with palliative outpatient.       Code Status: Full code      Consultants: Cardiology Palliative care GI    Procedures: None   Anti-infectives:   None     Discharge Exam: BP 115/79 (BP Location: Right Arm)   Pulse 94   Temp 98.5 F (36.9 C) (Oral)   Resp 20   Ht 6\' 1"  (1.854 m)   Wt 69 kg   SpO2 100%   BMI 20.07 kg/m  General: 80 y.o. year-old male frail-appearing in no acute distress.  He is alert and interactive. Cardiovascular: Irregular rate and rhythm no rubs or gallops.  No JVD or thyromegaly noted.   Respiratory: Clear to auscultation with no wheezes or rales. Abdomen: Soft nontender normal bowel sounds present.  Musculoskeletal: Trace lower extremity edema bilaterally.   Skin: Hyperpigmentation involving lower extremities bilaterally. Psychiatry: Mood is appropriate for condition and setting  Discharge Instructions You were cared for by a hospitalist during your hospital stay. If you have any questions about your discharge medications or the care you received while you were in the hospital after you are discharged, you can call the unit and asked to speak with the hospitalist on call if the hospitalist that took care of you is not available. Once you are discharged, your primary care physician will handle any further medical issues. Please note that NO REFILLS for any discharge medications will be authorized once you are discharged, as it is imperative that you return to your primary care physician (or establish a relationship with a primary care physician if you do not have one) for your aftercare needs so that they can reassess your need for medications and monitor your lab values.   Allergies as of 01/02/2021   No Known Allergies      Medication List     STOP taking these medications  amLODipine 5 MG tablet Commonly known as: NORVASC   metoprolol succinate 25 MG 24 hr tablet Commonly known as: TOPROL-XL   triamcinolone ointment 0.1 % Commonly known as: KENALOG       TAKE these medications    aspirin 81 MG EC tablet Take 1 tablet (81 mg total) by mouth  daily.   eucerin cream Apply topically as needed for dry skin. What changed:  how much to take when to take this   feeding supplement Liqd Take 237 mLs by mouth 3 (three) times daily between meals for 7 days.   ferrous sulfate 325 (65 FE) MG tablet Take 1 tablet (325 mg total) by mouth daily with breakfast. Start taking on: January 04, 2439   folic acid 1 MG tablet Commonly known as: FOLVITE Take 1 tablet (1 mg total) by mouth daily.   furosemide 20 MG tablet Commonly known as: Lasix Take 1 tablet (20 mg total) by mouth 2 (two) times daily.   lactulose 10 GM/15ML solution Commonly known as: CHRONULAC Take 15 mLs (10 g total) by mouth 2 (two) times daily.   multivitamin with minerals Tabs tablet Take 1 tablet by mouth daily.   potassium chloride 10 MEQ tablet Commonly known as: KLOR-CON M Take 1 tablet (10 mEq total) by mouth daily.       No Known Allergies  Follow-up Information     Axel Filler, MD. Call today.   Specialty: Internal Medicine Why: Please call for a post hospital follow-up appointment. Contact information: [place] [address] [phone]         Wilford Corner, MD. Call today.   Specialty: Gastroenterology Why: Please call for a post hospital follow-up appointment for decompensated cirrhosis. Contact information: [address] [place] [address] [phone]                  The results of significant diagnostics from this hospitalization (including imaging, microbiology, ancillary and laboratory) are listed below for reference.    Significant Diagnostic Studies: CT HEAD WO CONTRAST (5MM)  Result Date: 12/27/2020 CLINICAL DATA:  Mental status change EXAM: CT HEAD WITHOUT CONTRAST TECHNIQUE: Contiguous axial images were obtained from the base of the skull through the vertex without intravenous contrast. COMPARISON:  None. FINDINGS: Brain: Hypodensity in the pons and right  cerebellar hemisphere (series 2, images 11 and 10). No acute hemorrhage, mass, mass effect, or midline shift. Degree of central volume loss is likely at the upper limits of normal for age. Periventricular white matter changes, likely the sequela of chronic small vessel ischemic disease. No extra-axial collection or hydrocephalus. Vascular: No hyperdense vessel. Skull: No acute osseous abnormality. Sinuses/Orbits: Left phthisis bulbi. Normal right globe. The sinuses are unremarkable. Other: Trace fluid in the mastoid air cells. IMPRESSION: Hypodensity in the pons and right cerebellar hemisphere, of indeterminate acuity the. Consider MRI for further evaluation if clinically indicated. These results were called by telephone at the time of interpretation on 12/27/2020 at 3:48 pm to provider [facility] , who verbally acknowledged these results. Electronically Signed   By: Merilyn Baba M.D.   On: 12/27/2020 15:48   MR BRAIN WO CONTRAST  Result Date: 12/27/2020 CLINICAL DATA:  Delirium abnormal CT EXAM: MRI HEAD WITHOUT CONTRAST TECHNIQUE: Multiplanar, multiecho pulse sequences of the brain and surrounding structures were obtained without intravenous contrast. COMPARISON:  Same day CT head. FINDINGS: Mildly motion limited study. Brain: No acute infarction, hemorrhage, hydrocephalus, extra-axial collection or  mass lesion. Moderate patchy T2/FLAIR hyperintensity within the supratentorial and pontine white matter, nonspecific but compatible with chronic microvascular ischemic disease. Moderate atrophy with ex vacuo ventricular dilation. Punctate foci of susceptibility artifact in the left cerebellum and right frontal lobe, probably chronic microhemorrhages. Vascular: Major arterial flow voids are maintained at the skull base. Skull and upper cervical spine: Normal marrow signal. Sinuses/Orbits: Clear sinuses.  Left phthisis bulbi. Other: Trace mastoid effusions. IMPRESSION: 1. No evidence of acute intracranial  abnormality. 2. Moderate chronic microvascular ischemic disease and atrophy. Electronically Signed   By: Margaretha Sheffield M.D.   On: 12/27/2020 18:14   DG Chest Port 1 View  Result Date: 12/28/2020 CLINICAL DATA:  Altered mental status. EXAM: PORTABLE CHEST 1 VIEW COMPARISON:  December 22, 2020. FINDINGS: Marked cardiomegaly is noted. Left lung is clear. Mild right basilar atelectasis is noted with small right pleural effusion. Bony thorax is unremarkable. IMPRESSION: Marked cardiomegaly. Mild right basilar atelectasis is noted with small right pleural effusion. Electronically Signed   By: Marijo Conception M.D.   On: 12/28/2020 14:59   DG Chest Portable 1 View  Result Date: 12/22/2020 CLINICAL DATA:  Evaluate for pneumonia. EXAM: PORTABLE CHEST 1 VIEW COMPARISON:  Chest x-ray 10/07/2014. FINDINGS: Cardiac silhouette is markedly enlarged and has increased in size. The lungs are clear. There is no pleural effusion or pneumothorax. No acute fractures are seen. IMPRESSION: 1. Marked enlargement of the cardiac silhouette. This has increased in size. Findings may related to pericardial effusion and/or worsening cardiomegaly. Electronically Signed   By: Ronney Asters M.D.   On: 12/22/2020 19:02   ECHOCARDIOGRAM COMPLETE  Result Date: 12/23/2020    ECHOCARDIOGRAM REPORT   Patient Name:   YOVANNY COATS Date of Exam: 12/23/2020 Medical Rec #:  [id number]     Height:       73.0 in Accession #:    [id number]    Weight:       152.1 lb Date of Birth:  Oct 10, 1940      BSA:          1.915 m Patient Age:    53 years      BP:           130/103 mmHg Patient Gender: M             HR:           88 bpm. Exam Location:  Inpatient Procedure: 2D Echo, 3D Echo, Color Doppler and Cardiac Doppler Indications:    I31.3 Pericardial effusion (noninflammatory)  History:        Patient has prior history of Echocardiogram examinations, most                 recent 05/03/2009. CHF, Abnormal ECG, Mitral Valve Disease;                  Arrythmias:Atrial Fibrillation. ETOH. Cancer. Mild MVP.  Sonographer:    Roseanna Rainbow RDCS Referring Phys: [id number] [facility]  1. Left ventricular ejection fraction, by estimation, is 35 to 40%. The left ventricle has moderately decreased function. The left ventricle demonstrates global hypokinesis. There is moderate left ventricular hypertrophy. Left ventricular diastolic parameters are indeterminate.  2. Right ventricular systolic function is moderately reduced. The right ventricular size is severely enlarged. There is mildly elevated pulmonary artery systolic pressure. The estimated right ventricular systolic pressure is 35.5 mmHg.  3. Left atrial size was severely dilated.  4. Right atrial size was severely dilated.  5. A small to moderte pericardial effusion is present, measures 44mm adjacent to LV inferior wall. There is no evidence of cardiac tamponade.  6. The mitral valve is abnormal. Moderate to severe mitral valve regurgitation.  7. The tricuspid valve is abnormal. Tricuspid valve regurgitation is severe.  8. The aortic valve is tricuspid. Aortic valve regurgitation is mild. Aortic valve sclerosis/calcification is present, without any evidence of aortic stenosis.  9. Aortic dilatation noted. There is dilatation of the ascending aorta, measuring 39 mm. 10. The inferior vena cava is dilated in size with <50% respiratory variability, suggesting right atrial pressure of 15 mmHg. FINDINGS  Left Ventricle: Left ventricular ejection fraction, by estimation, is 35 to 40%. The left ventricle has moderately decreased function. The left ventricle demonstrates global hypokinesis. The left ventricular internal cavity size was normal in size. There is moderate left ventricular hypertrophy. Left ventricular diastolic parameters are indeterminate. Right Ventricle: The right ventricular size is severely enlarged. No increase in right ventricular wall thickness. Right ventricular systolic function is moderately  reduced. There is mildly elevated pulmonary artery systolic pressure. The tricuspid regurgitant velocity is 2.70 m/s, and with an assumed right atrial pressure of 15 mmHg, the estimated right ventricular systolic pressure is 93.7 mmHg. Left Atrium: Left atrial size was severely dilated. Right Atrium: Right atrial size was severely dilated. Pericardium: A small pericardial effusion is present. There is no evidence of cardiac tamponade. Mitral Valve: The mitral valve is abnormal. Moderate to severe mitral valve regurgitation. Tricuspid Valve: The tricuspid valve is abnormal. Tricuspid valve regurgitation is severe. Aortic Valve: The aortic valve is tricuspid. Aortic valve regurgitation is mild. Aortic regurgitation PHT measures 721 msec. Aortic valve sclerosis/calcification is present, without any evidence of aortic stenosis. Pulmonic Valve: The pulmonic valve was grossly normal. Pulmonic valve regurgitation is mild. Aorta: The aortic root is normal in size and structure and aortic dilatation noted. There is dilatation of the ascending aorta, measuring 39 mm. Venous: The inferior vena cava is dilated in size with less than 50% respiratory variability, suggesting right atrial pressure of 15 mmHg. IAS/Shunts: The interatrial septum was not well visualized.  LEFT VENTRICLE PLAX 2D LVIDd:         5.20 cm LVIDs:         4.70 cm LV PW:         1.30 cm LV IVS:        1.30 cm LVOT diam:     2.30 cm      3D Volume EF: LV SV:         40           3D EF:        39 % LV SV Index:   21           LV EDV:       222 ml LVOT Area:     4.15 cm     LV ESV:       135 ml                             LV SV:        87 ml  LV Volumes (MOD) LV vol d, MOD A2C: 167.0 ml LV vol d, MOD A4C: 171.0 ml LV vol s, MOD A2C: 121.0 ml LV vol s, MOD A4C: 100.0 ml LV SV MOD A2C:     46.0 ml LV SV MOD A4C:     171.0  ml LV SV MOD BP:      62.7 ml RIGHT VENTRICLE            IVC RV S prime:     3.87 cm/s  IVC diam: 3.70 cm TAPSE (M-mode): 1.2 cm LEFT ATRIUM               Index        RIGHT ATRIUM           Index LA diam:        5.40 cm  2.82 cm/m   RA Area:     55.70 cm LA Vol (A2C):   191.0 ml 99.72 ml/m  RA Volume:   283.00 ml 147.75 ml/m LA Vol (A4C):   109.0 ml 56.91 ml/m LA Biplane Vol: 159.0 ml 83.01 ml/m  AORTIC VALVE             PULMONIC VALVE LVOT Vmax:   82.20 cm/s  PR End Diast Vel: 2.43 msec LVOT Vmean:  47.550 cm/s LVOT VTI:    0.096 m AI PHT:      721 msec  AORTA Ao Root diam: 3.80 cm Ao Asc diam:  3.90 cm MITRAL VALVE                  TRICUSPID VALVE MV Area (PHT): 5.82 cm       TR Peak grad:   29.2 mmHg MV Decel Time: 130 msec       TR Vmax:        270.00 cm/s MR Peak grad:    116.2 mmHg MR Mean grad:    62.5 mmHg    SHUNTS MR Vmax:         539.00 cm/s  Systemic VTI:  0.10 m MR Vmean:        357.5 cm/s   Systemic Diam: 2.30 cm MR PISA:         2.26 cm MR PISA Eff ROA: 16 mm MR PISA Radius:  0.60 cm MV E velocity: 79.55 cm/s Oswaldo Milian MD Electronically signed by Oswaldo Milian MD Signature Date/Time: 12/23/2020/2:23:00 PM    Final    [place] ASCITES (ABDOMEN LIMITED)  Result Date: 12/29/2020 CLINICAL DATA:  Evaluate for paracentesis. EXAM: LIMITED ABDOMEN ULTRASOUND FOR ASCITES TECHNIQUE: Limited ultrasound survey for ascites was performed in all four abdominal quadrants. COMPARISON:  Ultrasound 12/22/2020 FINDINGS: Trace perihepatic ascites. Minimal fluid in the right lower quadrant, left upper quadrant and left lower quadrant. IMPRESSION: Small amount of ascites. Paracentesis was not performed due to the small amount of fluid. Electronically Signed   By: Markus Daft M.D.   On: 12/29/2020 17:21   US Abdomen Limited RUQ (LIVER/GB)  Result Date: 12/22/2020 CLINICAL DATA:  Elevated bilirubin EXAM: ULTRASOUND ABDOMEN LIMITED RIGHT UPPER QUADRANT COMPARISON:  CT 03/30/2014 FINDINGS: Gallbladder: No gallstones or wall thickening visualized. No sonographic Murphy sign noted by sonographer. Common bile duct: Diameter: 3 mm Liver: Nodular  liver contour suspicious for cirrhosis. No focal hepatic mass lesion is seen. There is antegrade flow in the main portal vein. Other: Multiple cysts in the right kidney. 5.8 by 5.7 x 4.8 cm mildly complicated right renal cyst contains a small amount of internal debris. IMPRESSION: 1. Cirrhotic morphology of the liver with ascites in the right upper quadrant 2. Negative for gallstones or biliary dilatation 3. Multiple right renal cysts with slightly complex exophytic cyst off the lower pole of right kidney. Dedicated renal sonogram may be obtained for more complete assessment. Electronically Signed  By: Donavan Foil M.D.   On: 12/22/2020 22:58    Microbiology: Recent Results (from the past 240 hour(s))  Urine Culture     Status: None   Collection Time: 12/28/20  2:35 PM   Specimen: In/Out Cath Urine  Result Value Ref Range Status   Specimen Description   Final    IN/OUT CATH URINE Performed at [facility], [place] [address], [place], [place] [address]    Special Requests   Final    NONE Performed at [facility], [place] [address] [place], [place] [address]    Culture   Final    NO GROWTH Performed at [facility], [place] [address], [place], [place] [address]    Report Status 12/30/2020 FINAL  Final  Culture, blood (routine x 2)     Status: None   Collection Time: 12/28/20  3:06 PM   Specimen: BLOOD  Result Value Ref Range Status   Specimen Description   Final    BLOOD LEFT ANTECUBITAL Performed at [place] [address], [place], [place] [address]    Special Requests   Final    BOTTLES DRAWN AEROBIC ONLY Blood Culture results may not be optimal due to an inadequate volume of blood received in culture bottles Performed at [facility] [address], [place], [place] [address]    Culture   Final    NO GROWTH 5 DAYS Performed at [facility], [place] [address], [place], [place] [address]     Report Status 01/02/2021 FINAL  Final  Culture, blood (routine x 2)     Status: None   Collection Time: 12/28/20  3:06 PM   Specimen: BLOOD  Result Value Ref Range Status   Specimen Description   Final    BLOOD BLOOD LEFT WRIST Performed at [facility] [address], [place], [place] [address]    Special Requests   Final    BOTTLES DRAWN AEROBIC ONLY Blood Culture results may not be optimal due to an inadequate volume of blood received in culture bottles Performed at [facility] [address], [place], [place] [address]    Culture   Final    NO GROWTH 5 DAYS Performed at [facility], [place] [address], [place], [place] [address]    Report Status 01/02/2021 FINAL  Final  MRSA Next Gen by PCR, Nasal     Status: None   Collection Time: 12/28/20  8:18 PM   Specimen: Nasal Mucosa; Nasal Swab  Result Value Ref Range Status   MRSA by PCR Next Gen NOT DETECTED NOT DETECTED Final    Comment: (NOTE) The GeneXpert MRSA Assay (FDA approved for NASAL specimens only), is one component of a comprehensive MRSA colonization surveillance program. It is not intended to diagnose MRSA infection nor to guide or monitor treatment for MRSA infections. Test performance is not FDA approved in patients less than 89 years old. Performed at [facility], [place] [address] [place], [place] [address]   Resp Panel by RT-PCR (Flu A&B, Covid) Nasopharyngeal Swab     Status: None   Collection Time: 01/02/21  1:08 PM   Specimen: Nasopharyngeal Swab; Nasopharyngeal(NP) swabs in vial transport medium  Result Value Ref Range Status   SARS Coronavirus 2 by RT PCR NEGATIVE NEGATIVE Final    Comment: (NOTE) SARS-CoV-2 target nucleic acids are NOT DETECTED.  The SARS-CoV-2 RNA is generally detectable in  upper respiratory specimens during the acute phase of infection. The lowest concentration of SARS-CoV-2 viral copies this assay can detect is 138  copies/mL. A negative result does not preclude SARS-Cov-2 infection and should not be used as the sole basis for treatment or other patient management decisions. A negative result may occur with  improper specimen collection/handling, submission of specimen other than nasopharyngeal swab, presence of viral mutation(s) within the areas targeted by this assay, and inadequate number of viral copies(<138 copies/mL). A negative result must be combined with clinical observations, patient history, and epidemiological information. The expected result is Negative.  Fact Sheet for Patients:  EntrepreneurPulse.com.au  Fact Sheet for Healthcare Providers:  IncredibleEmployment.be  This test is no t yet approved or cleared by the [place] FDA and  has been authorized for detection and/or diagnosis of SARS-CoV-2 by FDA under an Emergency Use Authorization (EUA). This EUA will remain  in effect (meaning this test can be used) for the duration of the COVID-19 declaration under Section 564(b)(1) of the Act, 21 U.S.C.section 360bbb-3(b)(1), unless the authorization is terminated  or revoked sooner.       Influenza A by PCR NEGATIVE NEGATIVE Final   Influenza B by PCR NEGATIVE NEGATIVE Final    Comment: (NOTE) The Xpert Xpress SARS-CoV-2/FLU/RSV plus assay is intended as an aid in the diagnosis of influenza from Nasopharyngeal swab specimens and should not be used as a sole basis for treatment. Nasal washings and aspirates are unacceptable for Xpert Xpress SARS-CoV-2/FLU/RSV testing.  Fact Sheet for Patients: EntrepreneurPulse.com.au  Fact Sheet for Healthcare Providers: IncredibleEmployment.be  This test is not yet approved or cleared by the [place] FDA and has been authorized for detection and/or diagnosis of SARS-CoV-2 by FDA under an Emergency Use Authorization (EUA). This EUA will remain in effect (meaning  this test can be used) for the duration of the COVID-19 declaration under Section 564(b)(1) of the Act, 21 U.S.C. section 360bbb-3(b)(1), unless the authorization is terminated or revoked.  Performed at [facility], [place] [address], [place], [place] [address]      Labs: Basic Metabolic Panel: Recent Labs  Lab 12/27/20 0406 12/28/20 0356 12/29/20 0251 12/30/20 0311 12/31/20 0549 01/01/21 0606 01/02/21 0522  NA 143 142 143 142 140 138 137  K 4.2 4.3 4.1 4.3 3.7 3.8 3.9  CL 110 109 112* 109 107 105 104  CO2 25 24 23 24 25 26 25   GLUCOSE 110* 117* 91 79 76 80 76  BUN 28* 36* 39* 40* 41* 39* 34*  CREATININE 1.35* 1.79* 1.85* 1.90* 1.78* 1.33* 1.14  CALCIUM 8.8* 8.9 8.9 9.0 8.8* 8.8* 8.7*  MG 1.9 2.2  --   --   --   --   --    Liver Function Tests: Recent Labs  Lab 12/29/20 0251 12/30/20 0311 12/31/20 0549 01/01/21 0606 01/02/21 0522  AST 69* 70* 59* 57* 60*  ALT 43 45* 41 40 42  ALKPHOS 136* 152* 153* 159* 154*  BILITOT 2.2* 2.9* 2.8* 2.3* 2.6*  PROT 6.7 7.1 7.0 6.9 6.6  ALBUMIN 2.1* 2.2* 2.2* 2.0* 2.1*   No results for input(s): LIPASE, AMYLASE in the last 168 hours. Recent Labs  Lab 12/29/20 1703  AMMONIA 34   CBC: Recent Labs  Lab 12/29/20 0251 12/30/20 0311 12/31/20 0549 01/01/21 0606 01/02/21 0522  WBC 5.0 4.0 3.8* 2.8* 2.7*  NEUTROABS 4.4 3.4 3.2 2.2 2.1  HGB 9.0* 10.0* 9.5* 9.5* 9.3*  HCT 28.4* 30.2* 28.7* 28.6* 27.4*  MCV 86.6 84.6 84.4 84.1 83.0  PLT 99* 95* 110* 110* 112*   Cardiac Enzymes: Recent Labs  Lab 12/31/20 0549  CKTOTAL 89   BNP: BNP (last 3 results) Recent Labs    07/04/20 1032 12/30/20 0311  BNP 558.9* 1,654.9*    ProBNP (last 3 results) No results for input(s): PROBNP in the last 8760 hours.  CBG: Recent Labs  Lab 12/28/20 1312 12/29/20 0747  GLUCAP 121* 85       Signed:  Kayleen Memos, MD [facility] 01/02/2021, 3:09 PM

## 2021-01-02 NOTE — NC FL2 (Addendum)
[place] LEVEL OF CARE SCREENING TOOL     IDENTIFICATION  Patient Name: Tommy Morris Birthdate: 03/16/1940 Sex: male Admission Date (Current Location): 12/22/2020  [facility] and [place] Number:  Herbalist and Address:  [facility],  [place], [place]      Provider Number: [id number]  Attending Physician Name and Address:  Kayleen Memos, DO  Relative Name and Phone Number:  Deirdre Peer Daughter   [phone]  Hiep, Ollis   [phone]    Current Level of Care: Hospital Recommended Level of Care: [place] Prior Approval Number:    Date Approved/Denied:   PASRR Number: [id number]  Discharge Plan: SNF    Current Diagnoses: Patient Active Problem List   Diagnosis Date Noted   Pericardial effusion 12/22/2020   AKI (acute kidney injury) ([place]) 12/22/2020   Health care maintenance 09/14/2019   Elevated bilirubin 03/19/2016   Gout 07/25/2015   Blindness of left eye 07/25/2015   Dermatitis 07/25/2015   Counseling regarding advanced directives and goals of care 70/17/7939   Chronic systolic heart failure ([place]) 03/28/2015   Alcohol use disorder 05/04/2009   Atrial fibrillation ([place]) 04/27/2009   Anemia 04/24/2009   Malignant neoplasm of bladder ([place]) 02/01/2006   Essential hypertension 02/01/2006    Orientation RESPIRATION BLADDER Height & Weight     Self, Place  Normal Incontinent Weight: 152 lb 1.9 oz (69 kg) Height:  6\' 1"  (185.4 cm)  BEHAVIORAL SYMPTOMS/MOOD NEUROLOGICAL BOWEL NUTRITION STATUS      Incontinent Diet (Heart Healthy 2g sodium)  AMBULATORY STATUS COMMUNICATION OF NEEDS Skin   Limited Assist Verbally Skin abrasions                       Personal Care Assistance Level of Assistance  Bathing, Feeding, Dressing Bathing Assistance: Limited assistance Feeding assistance: Limited assistance Dressing Assistance: Limited assistance     Functional Limitations Info  Sight,  Hearing, Speech Sight Info: Adequate Hearing Info: Adequate Speech Info: Adequate    SPECIAL CARE FACTORS FREQUENCY  PT (By licensed PT), OT (By licensed OT)     PT Frequency: Minimum 5 x a week OT Frequency: Minimum 5 x a week            Contractures Contractures Info: Not present    Additional Factors Info  Code Status, Allergies, Psychotropic Code Status Info: full Allergies Info: NKA Psychotropic Info: QUEtiapine (SEROQUEL) tablet 25 mg         Current Medications (01/02/2021):  This is the current hospital active medication list Current Facility-Administered Medications  Medication Dose Route Frequency Provider Last Rate Last Admin   aspirin EC tablet 81 mg  81 mg Oral Daily Tu, Ching T, DO   81 mg at 01/02/21 1010   Chlorhexidine Gluconate Cloth 2 % PADS 6 each  6 each Topical Daily Alma Friendly, MD   6 each at 01/02/21 1010   enoxaparin (LOVENOX) injection 40 mg  40 mg Subcutaneous Q24H Hall, Carole N, DO   40 mg at 01/01/21 2045   feeding supplement (ENSURE ENLIVE / ENSURE PLUS) liquid 237 mL  237 mL Oral TID BM Pokhrel, Laxman, MD   237 mL at 01/01/21 0913   ferrous sulfate tablet 325 mg  325 mg Oral Q breakfast Alma Friendly, MD   325 mg at 03/00/92 3300   folic acid (FOLVITE) tablet 1 mg  1 mg Oral Daily Tu, Ching T, DO  1 mg at 01/02/21 1010   furosemide (LASIX) tablet 20 mg  20 mg Oral BID Irene Pap N, DO   20 mg at 01/02/21 1010   hydrALAZINE (APRESOLINE) injection 5 mg  5 mg Intravenous Q6H PRN Tu, Ching T, DO       lactulose (CHRONULAC) 10 GM/15ML solution 10 g  10 g Oral BID Pokhrel, Laxman, MD   10 g at 01/02/21 1010   MEDLINE mouth rinse  15 mL Mouth Rinse BID Alma Friendly, MD   15 mL at 01/02/21 1010   multivitamin with minerals tablet 1 tablet  1 tablet Oral Daily Tu, Ching T, DO   1 tablet at 01/02/21 1010   QUEtiapine (SEROQUEL) tablet 25 mg  25 mg Oral Once Neila Gear, NP         Discharge Medications: Please see  discharge summary for a list of discharge medications.  Relevant Imaging Results:  Relevant Lab Results:   Additional Information SSN [id number]  Ross Ludwig, LCSW

## 2021-01-02 NOTE — Progress Notes (Signed)
Physical Therapy Treatment Patient Details Name: Tommy Morris MRN: [id number] DOB: 1940/03/27 Today's Date: 01/02/2021   History of Present Illness Pt is 80 yo male admitted on 12/22/20 with pericardial effusion, afib,, AKI, hyperkalemia, ETOH use, Metabolic encephalopathy.  Pt with hx of  bladder cancer s/p transurethral resection in 4315, chronic systolic heart failure, atrial fibrillation and hypertension, and ETOH use    PT Comments    The patient is alert, able to cooperate with therapy.  Patient frequently asking when  can he go home, does not know why he is still here. \The patient did  ambulate x 80' using RW.   Patient could not indicate what family is available to assist him. Continue PT.  Recommendations for follow up therapy are one component of a multi-disciplinary discharge planning process, led by the attending physician.  Recommendations may be updated based on patient status, additional functional criteria and insurance authorization.  Follow Up Recommendations  Skilled nursing-short term rehab (<3 hours/day) unless has 24/7 caregivers.     Assistance Recommended at Discharge Frequent or constant Supervision/Assistance  Equipment Recommendations  None recommended by PT    Recommendations for Other Services       Precautions / Restrictions Precautions Precautions: Fall     Mobility  Bed Mobility   Bed Mobility: Supine to Sit     Supine to sit: Min assist     General bed mobility comments: min assistance  to sit fully upright. Able to move the legs to bed edge    Transfers Overall transfer level: Needs assistance Equipment used: Rolling walker (2 wheels) Transfers: Sit to/from Stand Sit to Stand: Min assist           General transfer comment: Stood at RW from bed raised..    Ambulation/Gait Ambulation/Gait assistance: Min assist Gait Distance (Feet): 80 Feet Assistive device: Rolling walker (2 wheels) Gait Pattern/deviations: Step-through  pattern;Trunk flexed;Decreased stride length Gait velocity: decreased     General Gait Details: Patient  stands and supported on RW.   Stairs             Wheelchair Mobility    Modified Rankin (Stroke Patients Only)       Balance Overall balance assessment: Needs assistance Sitting-balance support: Feet supported;Bilateral upper extremity supported Sitting balance-Leahy Scale: Fair Sitting balance - Comments: Patietn sits  with trunk control   Standing balance support: Bilateral upper extremity supported;During functional activity Standing balance-Leahy Scale: Poor Standing balance comment: does rely  on RW, Stood x 3 minutes for pericare post BM.                            Cognition Arousal/Alertness: Awake/alert Behavior During Therapy: WFL for tasks assessed/performed Overall Cognitive Status: No family/caregiver present to determine baseline cognitive functioning Area of Impairment: Orientation;Awareness                 Orientation Level: Place;Time;Situation Current Attention Level: Selective   Following Commands: Follows one step commands consistently Safety/Judgement: Decreased awareness of deficits Awareness: Emergent Problem Solving: Requires verbal cues General Comments: Oriented to hospital.  Repeats" I want to go home. why am I here?" Perseverated on being moved all over the hospital.        Exercises      General Comments        Pertinent Vitals/Pain Pain Assessment: No/denies pain    Home Living Family/patient expects to be discharged to:: Private residence  Prior Function            PT Goals (current goals can now be found in the care plan section) Progress towards PT goals: Progressing toward goals    Frequency    Min 2X/week      PT Plan Current plan remains appropriate;Frequency needs to be updated;Discharge plan needs to be updated    Co-evaluation               AM-PAC PT "6 Clicks" Mobility   Outcome Measure  Help needed turning from your back to your side while in a flat bed without using bedrails?: A Little Help needed moving from lying on your back to sitting on the side of a flat bed without using bedrails?: A Little Help needed moving to and from a bed to a chair (including a wheelchair)?: A Lot Help needed standing up from a chair using your arms (e.g., wheelchair or bedside chair)?: A Lot Help needed to walk in hospital room?: A Lot Help needed climbing 3-5 steps with a railing? : Total 6 Click Score: 13    End of Session Equipment Utilized During Treatment: Gait belt Activity Tolerance: Patient tolerated treatment well Patient left: in chair;with call bell/phone within reach;with chair alarm set Nurse Communication: Mobility status PT Visit Diagnosis: Other abnormalities of gait and mobility (R26.89);Muscle weakness (generalized) (M62.81)     Time: 3358-2518 PT Time Calculation (min) (ACUTE ONLY): 41 min  Charges:  $Gait Training: 23-37 mins $Therapeutic Activity: 8-22 mins                     Tresa Endo PT Acute Rehabilitation Services Pager [phone] Office [phone]    Claretha Cooper 01/02/2021, 1:59 PM

## 2021-01-03 NOTE — Progress Notes (Signed)
OT Cancellation Note  Patient Details Name: Tommy Morris MRN: [id number] DOB: Jun 09, 1940   Cancelled Treatment:    Reason Eval/Treat Not Completed: Other (comment). Pt eating breakfast. Plan to reattempt in a bit.  Tyrone Schimke, OT Acute Rehabilitation Services Office: [phone]  01/03/2021, 9:19 AM

## 2021-01-03 NOTE — Discharge Summary (Signed)
Discharge Summary  Tommy Morris TJQ:[id number] DOB: October 05, 1940  PCP: Axel Filler, MD  Admit date: 12/22/2020 Discharge date: 01/03/2021  Time spent: 35 minutes  Recommendations for Outpatient Follow-up:  Follow-up with GI Follow-up with your primary care provider Follow-up with cardiology Follow-up with palliative care outpatient. Take your medications as prescribed Continue PT OT with assistance and fall precautions.  Discharge Diagnoses:  Active Hospital Problems   Diagnosis Date Noted   Pericardial effusion 12/22/2020   AKI (acute kidney injury) ([place]) 12/22/2020   Elevated bilirubin 76/22/6333   Chronic systolic heart failure ([place]) 03/28/2015   Alcohol use disorder 05/04/2009   Atrial fibrillation ([place]) 04/27/2009   Anemia 04/24/2009   Essential hypertension 02/01/2006    Resolved Hospital Problems  No resolved problems to display.    Discharge Condition: Stable  Diet recommendation: Resume previous diet.  Vitals:   01/02/21 2120 01/03/21 0544  BP: (!) 144/108 (!) 130/94  Pulse: 83 62  Resp: 18 18  Temp: 97.6 F (36.4 C) 97.7 F (36.5 C)  SpO2: 98% 97%    History of present illness:  Tommy Morris is a 80 y.o. male with medical history significant for remote history of bladder cancer s/p transurethral resection in 2014, alcohol use disorder, chronic systolic CHF, chronic atrial fibrillation not on anticoagulation, mitral regurgitation, and hypertension who presented to [facility] ED from home with generalized weakness and confusion.  Associated with decreased oral intake, dizziness and difficulty with ambulation.    Upon presentation to the ED, chest x-ray concerning for pericardial effusion, elevated LFTs, elevated INR, cirrhotic morphology of the liver with ascites in the right upper quadrant seen on right upper quadrant abdominal ultrasound done on 12/22/2020.  Patient was seen by cardiology, GI and interventional radiology, signed off.  Paracentesis could  not be done by IR due to not enough ascites fluid.  However patient's meld score was elevated at 21 with concern for decompensated cirrhosis.  Restarted p.o. Lasix 20 mg twice daily to avoid ascites and for his chronic systolic CHF.  Patient was evaluated by PT OT recommendation for SNF.  TOC assisting with SNF placement.   01/03/21: Patient was seen and examined at his bedside.  There were no acute events overnight.  He denies any abdominal distention or pain.  No nausea.  Tolerating a diet well.  He has no new complaints at this time.  Hospital Course:  Principal Problem:   Pericardial effusion Active Problems:   Anemia   Alcohol use disorder   Essential hypertension   Atrial fibrillation (HCC)   Chronic systolic heart failure (HCC)   Elevated bilirubin   AKI (acute kidney injury) ([place])  Resolved acute metabolic encephalopathy- More awake Resolved hypothermia- Noted to be somewhat lethargic on 12/28/20, but able to follow commands and answer simple questions CT head with hypodensity in the pons and right cerebellar MRI brain 12/27/20 no acute intracranial abnormality Urine culture no growth. Blood cultures negative to date. Chest x-ray nonacute. 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  Encephalopathy has resolved. Continue lactulose with goal bowel movement 2 to 4/day. Follow-up with GI outpatient.  Coagulopathy in the setting of decompensated cirrhosis INR 1.4 No overt bleeding.  Follow-up with GI.  Decompensated cirrhosis Meld score 21 Recent encephalopathy, resolved, on lactulose. Completely abstain from  alcohol use Completely abstain from hepatotoxic agents Continue salt restriction 2 g/day Continue p.o. Lasix 20 mg twice daily with potassium replacement Follow-up with GI   Resolved nonoliguric AKI, improving with Elevated CK Creatinine levels were elevated at 1.6  Baseline creatinine 1.1 with GFR greater than 60 Back to his baseline creatinine Continue to avoid nephrotoxic agents   Chronic systolic HF  BNP 5,188, currently euvolemic on exam. 2D Echo 12/23/2020 with EF 35 to 40%, global hypokinesis Seen by cardiology, might ideally be on hydralazine and nitroglycerin which can be initiated as outpatient as per cardiology Continue home p.o. Lasix 20 mg twice daily with potassium replacement. Continue to hold off home Toprol Xl due to soft Bps to avoid hypotension. Not on ARB due to soft BPs and AKI.  Moderate to severe mitral valve regurgitation Seen by cardiology Management per cardiology   Generalized weakness/failure to thrive Physical therapy has seen the patient and recommend skilled nursing facility placement at this time Continue thiamine, folic acid, multivitamins.  Continue to encourage increase in oral protein calorie intake. Continue oral supplement   Pericardial effusion, suspected secondary to CHF Seen by cardiology, signed off. Continue home diuretics Follow-up with cardiology outpatient   Essential hypertension BP currently soft Hold off home oral antihypertensives except for p.o. Lasix 20 mg twice daily Continue to monitor vital signs   Pancytopenia in the setting of cirrhosis Continue iron supplement, vitamin C16 supplement, folic acid supplement. Follow-up with your PCP and GI.  Alcohol use disorder  Received high-dose IV thiamine. Completed CIWA protocol Continue p.o. thiamine, multivitamin, folic acid supplement.   Chronic atrial fibrillation CHA2DS2-VASc of 4 Has declined anticoagulation in the past Heart rate is controlled Hold Coreg for now  due to soft BPs to avoid hypotension   Ethics/goals of care.  Currently full code Recommend follow-up with palliative outpatient.       Code Status: Full code      Consultants: Cardiology Palliative care GI   Procedures: 2D echo   Anti-infectives:   None     Discharge Exam: BP (!) 130/94 (BP Location: Left Arm)    Pulse 62    Temp 97.7 F (36.5 C) (Oral)    Resp 18    Ht 6\' 1"  (1.854 m)    Wt 69 kg    SpO2 97% Comment: Error fixed. -Yasmine NT   BMI 20.07 kg/m  General: 80 y.o. year-old male frail-appearing no acute distress.  He is alert and interactive.   Cardiovascular: Irregular rate and rhythm no rubs or gallops.  No JVD or thyromegaly noted. Respiratory: Clear to auscultation with no wheezes or rales.  Poor respiratory effort.   Abdomen: Soft nontender normal bowel sounds present.  Nondistended.   Musculoskeletal: Trace lower extremity edema bilaterally. Skin: Hyperpigmentation affecting lower extremities bilaterally.  Psychiatry: Mood is appropriate for condition and setting.  Discharge Instructions You were cared for by a hospitalist during your hospital stay. If you have any questions about your discharge medications or the care you received while you were in the hospital after you are discharged, you can call the unit and asked to speak with the hospitalist on call if the hospitalist that took care of you is not available. Once you are discharged, your primary care physician will handle any further medical issues. Please note that NO REFILLS for any discharge medications will be authorized once you are discharged,  as it is imperative that you return to your primary care physician (or establish a relationship with a primary care physician if you do not have one) for your aftercare needs so that they can reassess your need for medications and monitor your lab values.   Allergies as of 01/03/2021   No Known Allergies      Medication List     STOP taking these  medications    amLODipine 5 MG tablet Commonly known as: NORVASC   metoprolol succinate 25 MG 24 hr tablet Commonly known as: TOPROL-XL   triamcinolone ointment 0.1 % Commonly known as: KENALOG       TAKE these medications    aspirin 81 MG EC tablet Take 1 tablet (81 mg total) by mouth daily.   eucerin cream Apply topically as needed for dry skin. What changed:  how much to take when to take this   feeding supplement Liqd Take 237 mLs by mouth 3 (three) times daily between meals for 7 days.   ferrous sulfate 325 (65 FE) MG tablet Take 1 tablet (325 mg total) by mouth daily with breakfast.   folic acid 1 MG tablet Commonly known as: FOLVITE Take 1 tablet (1 mg total) by mouth daily.   furosemide 20 MG tablet Commonly known as: Lasix Take 1 tablet (20 mg total) by mouth 2 (two) times daily.   lactulose 10 GM/15ML solution Commonly known as: CHRONULAC Take 15 mLs (10 g total) by mouth 2 (two) times daily.   multivitamin with minerals Tabs tablet Take 1 tablet by mouth daily.   potassium chloride 10 MEQ tablet Commonly known as: KLOR-CON M Take 1 tablet (10 mEq total) by mouth daily.       No Known Allergies  Follow-up Information     Axel Filler, MD. Call today.   Specialty: Internal Medicine Why: Please call for a post hospital follow-up appointment. Contact information: [place] [address] [phone]         Wilford Corner, MD. Call today.   Specialty: Gastroenterology Why: Please call for a post hospital follow-up appointment for decompensated cirrhosis. Contact information: [address] [place] [address] [phone]                  The results of significant diagnostics from this hospitalization (including imaging, microbiology, ancillary and laboratory) are listed below for reference.    Significant Diagnostic Studies: CT HEAD WO CONTRAST (5MM)  Result Date:  12/27/2020 CLINICAL DATA:  Mental status change EXAM: CT HEAD WITHOUT CONTRAST TECHNIQUE: Contiguous axial images were obtained from the base of the skull through the vertex without intravenous contrast. COMPARISON:  None. FINDINGS: Brain: Hypodensity in the pons and right cerebellar hemisphere (series 2, images 11 and 10). No acute hemorrhage, mass, mass effect, or midline shift. Degree of central volume loss is likely at the upper limits of normal for age. Periventricular white matter changes, likely the sequela of chronic small vessel ischemic disease. No extra-axial collection or hydrocephalus. Vascular: No hyperdense vessel. Skull: No acute osseous abnormality. Sinuses/Orbits: Left phthisis bulbi. Normal right globe. The sinuses are unremarkable. Other: Trace fluid in the mastoid air cells. IMPRESSION: Hypodensity in the pons and right cerebellar hemisphere, of indeterminate acuity the. Consider MRI for further evaluation if clinically indicated. These results were called by telephone at the time of interpretation on 12/27/2020 at 3:48 pm to provider [facility] , who verbally acknowledged these results. Electronically Signed  By: Merilyn Baba M.D.   On: 12/27/2020 15:48   MR BRAIN WO CONTRAST  Result Date: 12/27/2020 CLINICAL DATA:  Delirium abnormal CT EXAM: MRI HEAD WITHOUT CONTRAST TECHNIQUE: Multiplanar, multiecho pulse sequences of the brain and surrounding structures were obtained without intravenous contrast. COMPARISON:  Same day CT head. FINDINGS: Mildly motion limited study. Brain: No acute infarction, hemorrhage, hydrocephalus, extra-axial collection or mass lesion. Moderate patchy T2/FLAIR hyperintensity within the supratentorial and pontine white matter, nonspecific but compatible with chronic microvascular ischemic disease. Moderate atrophy with ex vacuo ventricular dilation. Punctate foci of susceptibility artifact in the left cerebellum and right frontal lobe, probably chronic  microhemorrhages. Vascular: Major arterial flow voids are maintained at the skull base. Skull and upper cervical spine: Normal marrow signal. Sinuses/Orbits: Clear sinuses.  Left phthisis bulbi. Other: Trace mastoid effusions. IMPRESSION: 1. No evidence of acute intracranial abnormality. 2. Moderate chronic microvascular ischemic disease and atrophy. Electronically Signed   By: Margaretha Sheffield M.D.   On: 12/27/2020 18:14   DG Chest Port 1 View  Result Date: 12/28/2020 CLINICAL DATA:  Altered mental status. EXAM: PORTABLE CHEST 1 VIEW COMPARISON:  December 22, 2020. FINDINGS: Marked cardiomegaly is noted. Left lung is clear. Mild right basilar atelectasis is noted with small right pleural effusion. Bony thorax is unremarkable. IMPRESSION: Marked cardiomegaly. Mild right basilar atelectasis is noted with small right pleural effusion. Electronically Signed   By: Marijo Conception M.D.   On: 12/28/2020 14:59   DG Chest Portable 1 View  Result Date: 12/22/2020 CLINICAL DATA:  Evaluate for pneumonia. EXAM: PORTABLE CHEST 1 VIEW COMPARISON:  Chest x-ray 10/07/2014. FINDINGS: Cardiac silhouette is markedly enlarged and has increased in size. The lungs are clear. There is no pleural effusion or pneumothorax. No acute fractures are seen. IMPRESSION: 1. Marked enlargement of the cardiac silhouette. This has increased in size. Findings may related to pericardial effusion and/or worsening cardiomegaly. Electronically Signed   By: Ronney Asters M.D.   On: 12/22/2020 19:02   ECHOCARDIOGRAM COMPLETE  Result Date: 12/23/2020    ECHOCARDIOGRAM REPORT   Patient Name:   MING MCMANNIS Date of Exam: 12/23/2020 Medical Rec #:  [id number]     Height:       73.0 in Accession #:    [id number]    Weight:       152.1 lb Date of Birth:  [birth year]      BSA:          1.915 m Patient Age:    44 years      BP:           130/103 mmHg Patient Gender: M             HR:           88 bpm. Exam Location:  Inpatient Procedure: 2D Echo, 3D Echo,  Color Doppler and Cardiac Doppler Indications:    I31.3 Pericardial effusion (noninflammatory)  History:        Patient has prior history of Echocardiogram examinations, most                 recent 05/03/2009. CHF, Abnormal ECG, Mitral Valve Disease;                 Arrythmias:Atrial Fibrillation. ETOH. Cancer. Mild MVP.  Sonographer:    Roseanna Rainbow RDCS Referring Phys: [id number] Jud  1. Left ventricular ejection fraction, by estimation, is 35 to 40%. The left ventricle has moderately decreased function. The  left ventricle demonstrates global hypokinesis. There is moderate left ventricular hypertrophy. Left ventricular diastolic parameters are indeterminate.  2. Right ventricular systolic function is moderately reduced. The right ventricular size is severely enlarged. There is mildly elevated pulmonary artery systolic pressure. The estimated right ventricular systolic pressure is 69.6 mmHg.  3. Left atrial size was severely dilated.  4. Right atrial size was severely dilated.  5. A small to moderte pericardial effusion is present, measures 74mm adjacent to LV inferior wall. There is no evidence of cardiac tamponade.  6. The mitral valve is abnormal. Moderate to severe mitral valve regurgitation.  7. The tricuspid valve is abnormal. Tricuspid valve regurgitation is severe.  8. The aortic valve is tricuspid. Aortic valve regurgitation is mild. Aortic valve sclerosis/calcification is present, without any evidence of aortic stenosis.  9. Aortic dilatation noted. There is dilatation of the ascending aorta, measuring 39 mm. 10. The inferior vena cava is dilated in size with <50% respiratory variability, suggesting right atrial pressure of 15 mmHg. FINDINGS  Left Ventricle: Left ventricular ejection fraction, by estimation, is 35 to 40%. The left ventricle has moderately decreased function. The left ventricle demonstrates global hypokinesis. The left ventricular internal cavity size was normal in size. There  is moderate left ventricular hypertrophy. Left ventricular diastolic parameters are indeterminate. Right Ventricle: The right ventricular size is severely enlarged. No increase in right ventricular wall thickness. Right ventricular systolic function is moderately reduced. There is mildly elevated pulmonary artery systolic pressure. The tricuspid regurgitant velocity is 2.70 m/s, and with an assumed right atrial pressure of 15 mmHg, the estimated right ventricular systolic pressure is 78.9 mmHg. Left Atrium: Left atrial size was severely dilated. Right Atrium: Right atrial size was severely dilated. Pericardium: A small pericardial effusion is present. There is no evidence of cardiac tamponade. Mitral Valve: The mitral valve is abnormal. Moderate to severe mitral valve regurgitation. Tricuspid Valve: The tricuspid valve is abnormal. Tricuspid valve regurgitation is severe. Aortic Valve: The aortic valve is tricuspid. Aortic valve regurgitation is mild. Aortic regurgitation PHT measures 721 msec. Aortic valve sclerosis/calcification is present, without any evidence of aortic stenosis. Pulmonic Valve: The pulmonic valve was grossly normal. Pulmonic valve regurgitation is mild. Aorta: The aortic root is normal in size and structure and aortic dilatation noted. There is dilatation of the ascending aorta, measuring 39 mm. Venous: The inferior vena cava is dilated in size with less than 50% respiratory variability, suggesting right atrial pressure of 15 mmHg. IAS/Shunts: The interatrial septum was not well visualized.  LEFT VENTRICLE PLAX 2D LVIDd:         5.20 cm LVIDs:         4.70 cm LV PW:         1.30 cm LV IVS:        1.30 cm LVOT diam:     2.30 cm      3D Volume EF: LV SV:         40           3D EF:        39 % LV SV Index:   21           LV EDV:       222 ml LVOT Area:     4.15 cm     LV ESV:       135 ml  LV SV:        87 ml  LV Volumes (MOD) LV vol d, MOD A2C: 167.0 ml LV vol d, MOD  A4C: 171.0 ml LV vol s, MOD A2C: 121.0 ml LV vol s, MOD A4C: 100.0 ml LV SV MOD A2C:     46.0 ml LV SV MOD A4C:     171.0 ml LV SV MOD BP:      62.7 ml RIGHT VENTRICLE            IVC RV S prime:     3.87 cm/s  IVC diam: 3.70 cm TAPSE (M-mode): 1.2 cm LEFT ATRIUM              Index        RIGHT ATRIUM           Index LA diam:        5.40 cm  2.82 cm/m   RA Area:     55.70 cm LA Vol (A2C):   191.0 ml 99.72 ml/m  RA Volume:   283.00 ml 147.75 ml/m LA Vol (A4C):   109.0 ml 56.91 ml/m LA Biplane Vol: 159.0 ml 83.01 ml/m  AORTIC VALVE             PULMONIC VALVE LVOT Vmax:   82.20 cm/s  PR End Diast Vel: 2.43 msec LVOT Vmean:  47.550 cm/s LVOT VTI:    0.096 m AI PHT:      721 msec  AORTA Ao Root diam: 3.80 cm Ao Asc diam:  3.90 cm MITRAL VALVE                  TRICUSPID VALVE MV Area (PHT): 5.82 cm       TR Peak grad:   29.2 mmHg MV Decel Time: 130 msec       TR Vmax:        270.00 cm/s MR Peak grad:    116.2 mmHg MR Mean grad:    62.5 mmHg    SHUNTS MR Vmax:         539.00 cm/s  Systemic VTI:  0.10 m MR Vmean:        357.5 cm/s   Systemic Diam: 2.30 cm MR PISA:         2.26 cm MR PISA Eff ROA: 16 mm MR PISA Radius:  0.60 cm MV E velocity: 79.55 cm/s Oswaldo Milian MD Electronically signed by Oswaldo Milian MD Signature Date/Time: 12/23/2020/2:23:00 PM    Final    [place] ASCITES (ABDOMEN LIMITED)  Result Date: 12/29/2020 CLINICAL DATA:  Evaluate for paracentesis. EXAM: LIMITED ABDOMEN ULTRASOUND FOR ASCITES TECHNIQUE: Limited ultrasound survey for ascites was performed in all four abdominal quadrants. COMPARISON:  Ultrasound 12/22/2020 FINDINGS: Trace perihepatic ascites. Minimal fluid in the right lower quadrant, left upper quadrant and left lower quadrant. IMPRESSION: Small amount of ascites. Paracentesis was not performed due to the small amount of fluid. Electronically Signed   By: Markus Daft M.D.   On: 12/29/2020 17:21   US Abdomen Limited RUQ (LIVER/GB)  Result Date: 12/22/2020 CLINICAL DATA:   Elevated bilirubin EXAM: ULTRASOUND ABDOMEN LIMITED RIGHT UPPER QUADRANT COMPARISON:  CT 03/30/2014 FINDINGS: Gallbladder: No gallstones or wall thickening visualized. No sonographic Murphy sign noted by sonographer. Common bile duct: Diameter: 3 mm Liver: Nodular liver contour suspicious for cirrhosis. No focal hepatic mass lesion is seen. There is antegrade flow in the main portal vein. Other: Multiple cysts in the right kidney. 5.8 by 5.7 x 4.8 cm  mildly complicated right renal cyst contains a small amount of internal debris. IMPRESSION: 1. Cirrhotic morphology of the liver with ascites in the right upper quadrant 2. Negative for gallstones or biliary dilatation 3. Multiple right renal cysts with slightly complex exophytic cyst off the lower pole of right kidney. Dedicated renal sonogram may be obtained for more complete assessment. Electronically Signed   By: Donavan Foil M.D.   On: 12/22/2020 22:58    Microbiology: Recent Results (from the past 240 hour(s))  Urine Culture     Status: None   Collection Time: 12/28/20  2:35 PM   Specimen: In/Out Cath Urine  Result Value Ref Range Status   Specimen Description   Final    IN/OUT CATH URINE Performed at [facility], [place] [address], [place], [place] [address]    Special Requests   Final    NONE Performed at [facility], [place] [address] [place], [place] [address]    Culture   Final    NO GROWTH Performed at [facility], [place] [address], [place], [place] [address]    Report Status 12/30/2020 FINAL  Final  Culture, blood (routine x 2)     Status: None   Collection Time: 12/28/20  3:06 PM   Specimen: BLOOD  Result Value Ref Range Status   Specimen Description   Final    BLOOD LEFT ANTECUBITAL Performed at [place] [address] [place], [place] [address]    Special Requests   Final    BOTTLES DRAWN AEROBIC ONLY Blood Culture results may not be optimal due to an  inadequate volume of blood received in culture bottles Performed at [place] [address], [place], [place] [address]    Culture   Final    NO GROWTH 5 DAYS Performed at [facility], [place] [address], [place], [place] [address]    Report Status 01/02/2021 FINAL  Final  Culture, blood (routine x 2)     Status: None   Collection Time: 12/28/20  3:06 PM   Specimen: BLOOD  Result Value Ref Range Status   Specimen Description   Final    BLOOD BLOOD LEFT WRIST Performed at [place] [address] [place], [place] [address]    Special Requests   Final    BOTTLES DRAWN AEROBIC ONLY Blood Culture results may not be optimal due to an inadequate volume of blood received in culture bottles Performed at [place] [address] [place], [place] [address]    Culture   Final    NO GROWTH 5 DAYS Performed at [facility], [place] [address], [place], [place] [address]    Report Status 01/02/2021 FINAL  Final  MRSA Next Gen by PCR, Nasal     Status: None   Collection Time: 12/28/20  8:18 PM   Specimen: Nasal Mucosa; Nasal Swab  Result Value Ref Range Status   MRSA by PCR Next Gen NOT DETECTED NOT DETECTED Final    Comment: (NOTE) The GeneXpert MRSA Assay (FDA approved for NASAL specimens only), is one component of a comprehensive MRSA colonization surveillance program. It is not intended to diagnose MRSA infection nor to guide or monitor treatment for MRSA infections. Test performance is not FDA approved in patients less than 74 years old. Performed at [facility], [place] [address], [place],  [address]   Resp Panel by RT-PCR (Flu A&B, Covid)  Nasopharyngeal Swab     Status: None   Collection Time: 01/02/21  1:08 PM   Specimen: Nasopharyngeal Swab; Nasopharyngeal(NP) swabs in vial transport medium  Result Value Ref Range Status   SARS Coronavirus 2 by RT PCR NEGATIVE NEGATIVE Final     Comment: (NOTE) SARS-CoV-2 target nucleic acids are NOT DETECTED.  The SARS-CoV-2 RNA is generally detectable in upper respiratory specimens during the acute phase of infection. The lowest concentration of SARS-CoV-2 viral copies this assay can detect is 138 copies/mL. A negative result does not preclude SARS-Cov-2 infection and should not be used as the sole basis for treatment or other patient management decisions. A negative result may occur with  improper specimen collection/handling, submission of specimen other than nasopharyngeal swab, presence of viral mutation(s) within the areas targeted by this assay, and inadequate number of viral copies(<138 copies/mL). A negative result must be combined with clinical observations, patient history, and epidemiological information. The expected result is Negative.  Fact Sheet for Patients:  EntrepreneurPulse.com.au  Fact Sheet for Healthcare Providers:  IncredibleEmployment.be  This test is no t yet approved or cleared by the [place] FDA and  has been authorized for detection and/or diagnosis of SARS-CoV-2 by FDA under an Emergency Use Authorization (EUA). This EUA will remain  in effect (meaning this test can be used) for the duration of the COVID-19 declaration under Section 564(b)(1) of the Act, 21 U.S.C.section 360bbb-3(b)(1), unless the authorization is terminated  or revoked sooner.       Influenza A by PCR NEGATIVE NEGATIVE Final   Influenza B by PCR NEGATIVE NEGATIVE Final    Comment: (NOTE) The Xpert Xpress SARS-CoV-2/FLU/RSV plus assay is intended as an aid in the diagnosis of influenza from Nasopharyngeal swab specimens and should not be used as a sole basis for treatment. Nasal washings and aspirates are unacceptable for Xpert Xpress SARS-CoV-2/FLU/RSV testing.  Fact Sheet for Patients: EntrepreneurPulse.com.au  Fact Sheet for Healthcare  Providers: IncredibleEmployment.be  This test is not yet approved or cleared by the [place] FDA and has been authorized for detection and/or diagnosis of SARS-CoV-2 by FDA under an Emergency Use Authorization (EUA). This EUA will remain in effect (meaning this test can be used) for the duration of the COVID-19 declaration under Section 564(b)(1) of the Act, 21 U.S.C. section 360bbb-3(b)(1), unless the authorization is terminated or revoked.  Performed at [facility], [place] [address] [place], [place] [address]      Labs: Basic Metabolic Panel: Recent Labs  Lab 12/28/20 0356 12/29/20 0251 12/30/20 0311 12/31/20 0549 01/01/21 0606 01/02/21 0522  NA 142 143 142 140 138 137  K 4.3 4.1 4.3 3.7 3.8 3.9  CL 109 112* 109 107 105 104  CO2 24 23 24 25 26 25   GLUCOSE 117* 91 79 76 80 76  BUN 36* 39* 40* 41* 39* 34*  CREATININE 1.79* 1.85* 1.90* 1.78* 1.33* 1.14  CALCIUM 8.9 8.9 9.0 8.8* 8.8* 8.7*  MG 2.2  --   --   --   --   --    Liver Function Tests: Recent Labs  Lab 12/29/20 0251 12/30/20 0311 12/31/20 0549 01/01/21 0606 01/02/21 0522  AST 69* 70* 59* 57* 60*  ALT 43 45* 41 40 42  ALKPHOS 136* 152* 153* 159* 154*  BILITOT 2.2* 2.9* 2.8* 2.3* 2.6*  PROT 6.7 7.1 7.0 6.9 6.6  ALBUMIN 2.1* 2.2* 2.2* 2.0* 2.1*   No results for input(s): LIPASE, AMYLASE in the last 168 hours. Recent  Labs  Lab 12/29/20 1703  AMMONIA 34   CBC: Recent Labs  Lab 12/29/20 0251 12/30/20 0311 12/31/20 0549 01/01/21 0606 01/02/21 0522  WBC 5.0 4.0 3.8* 2.8* 2.7*  NEUTROABS 4.4 3.4 3.2 2.2 2.1  HGB 9.0* 10.0* 9.5* 9.5* 9.3*  HCT 28.4* 30.2* 28.7* 28.6* 27.4*  MCV 86.6 84.6 84.4 84.1 83.0  PLT 99* 95* 110* 110* 112*   Cardiac Enzymes: Recent Labs  Lab 12/31/20 0549  CKTOTAL 89   BNP: BNP (last 3 results) Recent Labs    07/04/20 1032 12/30/20 0311  BNP 558.9* 1,654.9*    ProBNP (last 3 results) No results for input(s): PROBNP in  the last 8760 hours.  CBG: Recent Labs  Lab 12/28/20 1312 12/29/20 0747  GLUCAP 121* 85       Signed:  Kayleen Memos, MD [facility] 01/03/2021, 11:57 AM

## 2021-01-03 NOTE — Plan of Care (Signed)

## 2021-01-03 NOTE — Evaluation (Addendum)
Occupational Therapy Evaluation Patient Details Name: Tommy Morris MRN: [id number] DOB: 1940-05-31 Today's Date: 01/03/2021   History of Present Illness Pt is 80 yo male admitted on 12/22/20 with pericardial effusion, afib,, AKI, hyperkalemia, ETOH use, Metabolic encephalopathy.  Pt with hx of  bladder cancer s/p transurethral resection in 4696, chronic systolic heart failure, atrial fibrillation and hypertension, and ETOH use   Clinical Impression   Pt admitted with the above diagnoses and presents with below problem list. Pt will benefit from continued acute OT to address the below listed deficits and maximize independence with basic ADLs prior to d/c to venue below. Pt with unknown PLOF and home setup. Pt is unable to give reliable history and no family present. Pt currently needs up to mod A with LB ADLs, min A with short distance, functional mobility. Impaired cognition, unclear baseline. Up in recliner with chair alarm on at end of session.        Recommendations for follow up therapy are one component of a multi-disciplinary discharge planning process, led by the attending physician.  Recommendations may be updated based on patient status, additional functional criteria and insurance authorization.   Follow Up Recommendations  Skilled nursing-short term rehab (<3 hours/day)    Assistance Recommended at Discharge Frequent or constant Supervision/Assistance  Functional Status Assessment  Patient has had a recent decline in their functional status and demonstrates the ability to make significant improvements in function in a reasonable and predictable amount of time.  Equipment Recommendations  Other (comment) (defer to next venue)    Recommendations for Other Services       Precautions / Restrictions Precautions Precautions: Fall Restrictions Weight Bearing Restrictions: No      Mobility Bed Mobility Overal bed mobility: Needs Assistance Bed Mobility: Supine to Sit      Supine to sit: Min guard     General bed mobility comments: extra time and effort. pt losing balance a couple of times during trunk elevation, able to self-correct.    Transfers Overall transfer level: Needs assistance Equipment used: Rolling walker (2 wheels) Transfers: Sit to/from Stand Sit to Stand: Min assist           General transfer comment: elevated seat surface. assist to stabilize rw as pt pulls up with both hands. success on second attempt.      Balance Overall balance assessment: Needs assistance Sitting-balance support: Feet supported;Bilateral upper extremity supported Sitting balance-Leahy Scale: Fair     Standing balance support: Bilateral upper extremity supported;During functional activity Standing balance-Leahy Scale: Poor                             ADL either performed or assessed with clinical judgement   ADL Overall ADL's : Needs assistance/impaired Eating/Feeding: Set up   Grooming: Set up;Sitting   Upper Body Bathing: Set up;Sitting   Lower Body Bathing: Moderate assistance;Sit to/from stand   Upper Body Dressing : Set up;Sitting   Lower Body Dressing: Moderate assistance;Sit to/from stand   Toilet Transfer: Minimal assistance;Ambulation;Comfort height toilet;Rolling walker (2 wheels);Grab bars;BSC/3in1   Toileting- Clothing Manipulation and Hygiene: Minimal assistance;Moderate assistance;Sit to/from stand;Sitting/lateral lean   Tub/ Shower Transfer: Moderate assistance;Ambulation;Shower seat;Rolling walker (2 wheels)   Functional mobility during ADLs: Minimal assistance;Rolling walker (2 wheels) General ADL Comments: supported sitting for UB ADLs.     Vision Baseline Vision/History:  (L eye blindness)       Perception     Praxis  Pertinent Vitals/Pain Pain Assessment: No/denies pain     Hand Dominance     Extremity/Trunk Assessment Upper Extremity Assessment Upper Extremity Assessment: Generalized  weakness   Lower Extremity Assessment Lower Extremity Assessment: Defer to PT evaluation   Cervical / Trunk Assessment Cervical / Trunk Assessment: Kyphotic   Communication Communication Communication: Other (comment) (difficult to understand at times, low volume, words tend to run together)   Cognition Arousal/Alertness: Awake/alert Behavior During Therapy: Flat affect (easily annoyed) Overall Cognitive Status: No family/caregiver present to determine baseline cognitive functioning Area of Impairment: Orientation;Memory;Following commands;Safety/judgement;Awareness;Problem solving;Attention                 Orientation Level: Place;Time;Situation Current Attention Level: Selective Memory: Decreased short-term memory;Decreased recall of precautions Following Commands: Follows one step commands with increased time Safety/Judgement: Decreased awareness of safety;Decreased awareness of deficits Awareness: Intellectual Problem Solving: Slow processing;Decreased initiation;Difficulty sequencing;Requires verbal cues       General Comments       Exercises     Shoulder Instructions      Home Living Family/patient expects to be discharged to:: Private residence Living Arrangements: Alone                               Additional Comments: pt giving inconsistent home setup data. Consistently endorses that he lives alone. pt reports he has 5 children who live locally, unable to confirm.      Prior Functioning/Environment Prior Level of Function : Patient poor historian/Family not available             Mobility Comments: unsure , chart states he lives alone and was able to walk around . pt very poor historian, and difficult to understand.          OT Problem List: Decreased strength;Decreased activity tolerance;Impaired balance (sitting and/or standing);Decreased cognition;Decreased safety awareness;Decreased knowledge of use of DME or AE;Decreased  knowledge of precautions      OT Treatment/Interventions: Self-care/ADL training;DME and/or AE instruction;Therapeutic activities;Patient/family education;Balance training;Cognitive remediation/compensation    OT Goals(Current goals can be found in the care plan section) Acute Rehab OT Goals Patient Stated Goal: values independence OT Goal Formulation: With patient Time For Goal Achievement: 01/17/21 Potential to Achieve Goals: Fair ADL Goals Pt Will Perform Grooming: with modified independence;sitting;standing Pt Will Perform Lower Body Bathing: with supervision;sit to/from stand Pt Will Perform Lower Body Dressing: with supervision;sit to/from stand Pt Will Transfer to Toilet: with supervision;ambulating Pt Will Perform Toileting - Clothing Manipulation and hygiene: with supervision;sitting/lateral leans;sit to/from stand  OT Frequency: Min 2X/week   Barriers to D/C:            Co-evaluation              AM-PAC OT "6 Clicks" Daily Activity     Outcome Measure Help from another person eating meals?: None Help from another person taking care of personal grooming?: None Help from another person toileting, which includes using toliet, bedpan, or urinal?: A Little Help from another person bathing (including washing, rinsing, drying)?: A Little Help from another person to put on and taking off regular upper body clothing?: A Little Help from another person to put on and taking off regular lower body clothing?: A Little 6 Click Score: 20   End of Session Equipment Utilized During Treatment: Rolling walker (2 wheels) Nurse Communication: Mobility status;Other (comment) (NT)  Activity Tolerance: Patient limited by fatigue;Patient tolerated treatment well Patient left: in chair;with call  bell/phone within reach;with chair alarm set  OT Visit Diagnosis: Unsteadiness on feet (R26.81);Muscle weakness (generalized) (M62.81);Other symptoms and signs involving cognitive function                 Time: 1000-1019 OT Time Calculation (min): 19 min Charges:  OT General Charges $OT Visit: 1 Visit OT Evaluation $OT Eval Moderate Complexity: [place], OT Acute Rehabilitation Services Office: [phone]   Hortencia Pilar 01/03/2021, 10:43 AM

## 2021-01-03 NOTE — Progress Notes (Signed)
°   01/03/21 1300  Mobility  Activity Refused mobility   Pt refused secondary to wanting to rest. NT stated he was just moved to bed from the chair PTA.  [place] Specialist Acute Rehab Services Office: [phone]

## 2021-01-04 NOTE — Plan of Care (Signed)

## 2021-01-04 NOTE — Plan of Care (Signed)

## 2021-01-04 NOTE — Progress Notes (Signed)
Escorted off unit @ 2120 via [facility].  Message left for [facility] to contact this Rn for discharge report

## 2021-01-04 NOTE — Progress Notes (Signed)
Physical Therapy Treatment Patient Details Name: Tommy Morris MRN: [id number] DOB: [birth year] Today's Date: 01/04/2021   History of Present Illness Tommy Morris is a 80 y.o. male with medical history significant for remote history of bladder cancer s/p transurethral resection in 2014, alcohol use disorder, chronic systolic CHF, chronic atrial fibrillation not on anticoagulation, mitral regurgitation, and hypertension who presented to [facility] ED from home with generalized weakness and confusion.  Associated with decreased oral intake, dizziness and difficulty with ambulation.       Upon presentation to the ED, chest x-ray concerning for pericardial effusion, elevated LFTs, elevated INR, cirrhotic morphology of the liver with ascites in the right upper quadrant seen on right upper quadrant abdominal ultrasound done on 12/22/2020.  Patient was seen by cardiology, GI and interventional radiology, signed off.  Paracentesis could not be done by IR due to not enough ascites fluid.  However patient's meld score was elevated at 21 with concern for decompensated cirrhosis.  Restarted p.o. Lasix 20 mg twice daily to avoid ascites and for his chronic systolic CHF.     Patient was evaluated by PT OT recommendation for SNF.  TOC assisting with SNF placement.    PT Comments    General Comments: AxO x 1 he is not sure why he is in the hospital.  Does follow all commands and able to exopress needs.  Present with poor Medical Self insight. Assisted OOB to amb was limited.  General bed mobility comments: pt required increased assist thius session due to weakness.  Increased assist back to bed to support B LE. General transfer comment: pt required increased assist this session due to weakness.  From elevated bed he required Mod Assist present with posterior LOB and required 50% VC's on proper hand placement to avoid pulling up on walker.  From lower toilet level, pt was unable to rise and required Max Assist.  Very unsteady  with transfers and turns with poor self correction to miodline.  HIGH FALL RISK.  General Gait Details: Very limited amb distance to and from bathroom only due to fatigue and weakness.  B knees buckling R>L.  Very unsteady with poor forward flex posture.  Required increased assist with turns and back gait as pt exhibits delayed correction reaction and poor self ability to achieve midline.  HIGH FALL RISK. Pt lives home alone and will need ST Rehab at SNF prior to safely returning.    Recommendations for follow up therapy are one component of a multi-disciplinary discharge planning process, led by the attending physician.  Recommendations may be updated based on patient status, additional functional criteria and insurance authorization.  Follow Up Recommendations  Skilled nursing-short term rehab (<3 hours/day)     Assistance Recommended at Discharge Frequent or constant Supervision/Assistance  Equipment Recommendations  None recommended by PT    Recommendations for Other Services       Precautions / Restrictions Precautions Precautions: Fall     Mobility  Bed Mobility Overal bed mobility: Needs Assistance Bed Mobility: Supine to Sit;Sit to Supine     Supine to sit: Min assist Sit to supine: Mod assist   General bed mobility comments: pt required increased assist thius session due to weakness.  Increased assist back to bed to support B LE.    Transfers Overall transfer level: Needs assistance Equipment used: Rolling walker (2 wheels) Transfers: Sit to/from Stand Sit to Stand: Mod assist;Max assist           General transfer comment: pt  required increased assist this session due to weakness.  From elevated bed he required Mod Assist present with posterior LOB and required 50% VC's on proper hand placement to avoid pulling up on walker.  From lower toilet level, pt was unable to rise and required Max Assist.  Very unsteady with transfers and turns with poor self correction to  miodline.  HIGH FALL RISK.    Ambulation/Gait Ambulation/Gait assistance: Min assist;Mod assist Gait Distance (Feet): 16 Feet Assistive device: Rolling walker (2 wheels) Gait Pattern/deviations: Step-through pattern;Trunk flexed;Decreased stride length Gait velocity: decreased     General Gait Details: Very limited amb distance to and from bathroom only due to fatigue and weakness.  B knees buckling R>L.  Very unsteady with poor forward flex posture.  Required increased assist with turns and back gait as pt exhibits delayed correction reaction and poor self ability to achieve midline.  HIGH FALL RISK.   Stairs             Wheelchair Mobility    Modified Rankin (Stroke Patients Only)       Balance                                            Cognition Arousal/Alertness: Awake/alert Behavior During Therapy: Flat affect Overall Cognitive Status: No family/caregiver present to determine baseline cognitive functioning                                 General Comments: AxO x 1 he is not sure why he is in the hospital.  Does follow all commands and able to exopress needs.  Present with poor Medical Self insight.        Exercises      General Comments        Pertinent Vitals/Pain Pain Assessment: No/denies pain    Home Living                          Prior Function            PT Goals (current goals can now be found in the care plan section) Progress towards PT goals: Progressing toward goals    Frequency    Min 2X/week      PT Plan Current plan remains appropriate;Frequency needs to be updated;Discharge plan needs to be updated    Co-evaluation              AM-PAC PT "6 Clicks" Mobility   Outcome Measure  Help needed turning from your back to your side while in a flat bed without using bedrails?: A Lot Help needed moving from lying on your back to sitting on the side of a flat bed without using  bedrails?: A Lot Help needed moving to and from a bed to a chair (including a wheelchair)?: A Lot Help needed standing up from a chair using your arms (e.g., wheelchair or bedside chair)?: A Lot Help needed to walk in hospital room?: A Lot Help needed climbing 3-5 steps with a railing? : Total 6 Click Score: 11    End of Session Equipment Utilized During Treatment: Gait belt Activity Tolerance: Patient limited by fatigue Patient left: in bed;with bed alarm set;with call bell/phone within reach Nurse Communication: Mobility status (Pt had a large BM) PT Visit Diagnosis:  Other abnormalities of gait and mobility (R26.89);Muscle weakness (generalized) (M62.81)     Time: 2800-3491 PT Time Calculation (min) (ACUTE ONLY): 34 min  Charges:  $Gait Training: 8-22 mins $Therapeutic Activity: 8-22 mins                    Rica Koyanagi  PTA Acute  Rehabilitation Services Pager      [phone] Office      [phone]

## 2021-01-04 NOTE — Discharge Summary (Signed)
Discharge Summary  Tommy Morris WUX:[id number] DOB: 07/13/1940  PCP: Axel Filler, MD  Admit date: 12/22/2020 Discharge date: 01/04/2021  Time spent: 35 minutes  Recommendations for Outpatient Follow-up:  Follow-up with GI Follow-up with your primary care provider Follow-up with cardiology Follow-up with palliative care outpatient. Take your medications as prescribed Continue PT OT with assistance and fall precautions.  Discharge Diagnoses:  Active Hospital Problems   Diagnosis Date Noted   Pericardial effusion 12/22/2020   AKI (acute kidney injury) ([place]) 12/22/2020   Elevated bilirubin 25/36/6440   Chronic systolic heart failure ([place]) 03/28/2015   Alcohol use disorder 05/04/2009   Atrial fibrillation ([place]) 04/27/2009   Anemia 04/24/2009   Essential hypertension 02/01/2006    Resolved Hospital Problems  No resolved problems to display.    Discharge Condition: Stable  Diet recommendation: Resume previous diet.  Vitals:   01/03/21 2106 01/04/21 0525  BP: (!) 126/91 (!) 124/98  Pulse: 63 96  Resp: 18 18  Temp: (!) 97.5 F (36.4 C) 97.8 F (36.6 C)  SpO2: 97% 97%    History of present illness:  Tommy Morris is a 80 y.o. male with medical history significant for remote history of bladder cancer s/p transurethral resection in 2014, alcohol use disorder, chronic systolic CHF, chronic atrial fibrillation not on anticoagulation, mitral regurgitation, and hypertension who presented to [facility] ED from home with generalized weakness and confusion.  Associated with decreased oral intake, dizziness and difficulty with ambulation.    Upon presentation to the ED, chest x-ray concerning for pericardial effusion, elevated LFTs, elevated INR, cirrhotic morphology of the liver with ascites in the right upper quadrant seen on right upper quadrant abdominal ultrasound done on 12/22/2020.  Patient was seen by cardiology, GI and interventional radiology, signed off.  Paracentesis  could not be done by IR due to not enough ascites fluid.  However patient's meld score was elevated at 21 with concern for decompensated cirrhosis.  Restarted p.o. Lasix 20 mg twice daily to avoid ascites and for his chronic systolic CHF.  Patient was evaluated by PT OT recommendation for SNF.  TOC assisting with SNF placement.   01/04/21: Patient was seen at bedside.  There were no acute events overnight.  He has no new complaints.  Hospital Course:  Principal Problem:   Pericardial effusion Active Problems:   Anemia   Alcohol use disorder   Essential hypertension   Atrial fibrillation (HCC)   Chronic systolic heart failure (HCC)   Elevated bilirubin   AKI (acute kidney injury) ([place])  Resolved acute metabolic encephalopathy- More awake Resolved hypothermia- Noted to be somewhat lethargic on 12/28/20, but able to follow commands and answer simple questions CT head with hypodensity in the pons and right cerebellar MRI brain 12/27/20 no acute intracranial abnormality Urine culture no growth. Blood cultures negative to date. Chest x-ray nonacute. 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  Encephalopathy has resolved. Continue lactulose with goal bowel movement 2 to 4/day. Follow-up with GI outpatient.  Coagulopathy in the setting of decompensated cirrhosis INR 1.4 No overt bleeding. Follow-up with GI.  Decompensated cirrhosis Meld score 21 Recent encephalopathy, resolved, on lactulose. Completely abstain from alcohol use Completely abstain  from hepatotoxic agents Continue salt restriction 2 g/day Continue p.o.  Lasix 20 mg twice daily with potassium replacement Follow-up with GI   Resolved nonoliguric AKI, improving with Elevated CK Creatinine levels were elevated at 1.6  Baseline creatinine 1.1 with GFR greater than 60 Back to his baseline creatinine Continue to avoid nephrotoxic agents   Chronic systolic HF  BNP 1,610, currently euvolemic on exam. 2D Echo 12/23/2020 with EF 35 to 40%, global hypokinesis Seen by cardiology, might ideally be on hydralazine and nitroglycerin which can be initiated as outpatient as per cardiology Continue home p.o. Lasix 20 mg twice daily with potassium replacement. Continue to hold off home Toprol Xl due to soft Bps to avoid hypotension. Not on ARB due to soft BPs and AKI.  Moderate to severe mitral valve regurgitation Seen by cardiology Management per cardiology   Generalized weakness/failure to thrive Physical therapy has seen the patient and recommend skilled nursing facility placement at this time Continue thiamine, folic acid, multivitamins.  Continue to encourage increase in oral protein calorie intake. Continue oral supplement   Pericardial effusion, suspected secondary to CHF Seen by cardiology, signed off. Continue home diuretics Follow-up with cardiology outpatient   Essential hypertension BP currently soft Hold off home oral antihypertensives except for p.o. Lasix 20 mg twice daily Continue to monitor vital signs   Pancytopenia in the setting of cirrhosis Continue iron supplement, vitamin R60 supplement, folic acid supplement. Follow-up with your PCP and GI.  Alcohol use disorder  Received high-dose IV thiamine. Completed CIWA protocol Continue p.o. thiamine, multivitamin, folic acid supplement.   Chronic atrial fibrillation CHA2DS2-VASc of 4 Has declined anticoagulation in the past Heart rate is controlled Hold Coreg for now due to soft BPs to avoid hypotension   Ethics/goals of care.  Currently full code Recommend follow-up  with palliative outpatient.       Code Status: Full code      Consultants: Cardiology Palliative care GI   Procedures: 2D echo   Anti-infectives:   None     Discharge Exam: BP (!) 124/98 (BP Location: Right Arm)    Pulse 96    Temp 97.8 F (36.6 C)    Resp 18    Ht 6\' 1"  (1.854 m)    Wt 69 kg    SpO2 97%    BMI 20.07 kg/m  General: 80 y.o. year-old male frail-appearing no acute distress.  He is alert and interactive.   Cardiovascular: Irregular rate and rhythm no rubs or gallops.  No JVD or thyromegaly noted. Respiratory: Clear to auscultation with no wheezes or rales.  Poor respiratory effort.   Abdomen: Soft nontender normal bowel sounds present.  Nondistended.   Musculoskeletal: Trace lower extremity edema bilaterally. Skin: Hyperpigmentation affecting lower extremities bilaterally.  Psychiatry: Mood is appropriate for condition and setting.  Discharge Instructions You were cared for by a hospitalist during your hospital stay. If you have any questions about your discharge medications or the care you received while you were in the hospital after you are discharged, you can call the unit and asked to speak with the hospitalist on call if the hospitalist that took care of you is not available. Once you are discharged, your primary care physician will handle any further medical issues. Please note that NO REFILLS for any discharge medications will be authorized once you are discharged, as it is imperative that you return to your primary care physician (or establish a relationship with a primary care physician if you do not have  one) for your aftercare needs so that they can reassess your need for medications and monitor your lab values.   Allergies as of 01/04/2021   No Known Allergies      Medication List     STOP taking these medications    amLODipine 5 MG tablet Commonly known as: NORVASC   metoprolol succinate 25 MG 24 hr tablet Commonly known as: TOPROL-XL    triamcinolone ointment 0.1 % Commonly known as: KENALOG       TAKE these medications    aspirin 81 MG EC tablet Take 1 tablet (81 mg total) by mouth daily.   eucerin cream Apply topically as needed for dry skin. What changed:  how much to take when to take this   feeding supplement Liqd Take 237 mLs by mouth 3 (three) times daily between meals for 7 days.   ferrous sulfate 325 (65 FE) MG tablet Take 1 tablet (325 mg total) by mouth daily with breakfast.   folic acid 1 MG tablet Commonly known as: FOLVITE Take 1 tablet (1 mg total) by mouth daily.   furosemide 20 MG tablet Commonly known as: Lasix Take 1 tablet (20 mg total) by mouth 2 (two) times daily.   lactulose 10 GM/15ML solution Commonly known as: CHRONULAC Take 15 mLs (10 g total) by mouth 2 (two) times daily.   multivitamin with minerals Tabs tablet Take 1 tablet by mouth daily.   potassium chloride 10 MEQ tablet Commonly known as: KLOR-CON M Take 1 tablet (10 mEq total) by mouth daily.       No Known Allergies  Contact information for follow-up providers     Axel Filler, MD. Call today.   Specialty: Internal Medicine Why: Please call for a post hospital follow-up appointment. Contact information: [place] [address] [phone]         Wilford Corner, MD. Call today.   Specialty: Gastroenterology Why: Please call for a post hospital follow-up appointment for decompensated cirrhosis. Contact information: [address] [place] [address] [phone]              Contact information for after-discharge care     Destination     [facility] .   Service: Skilled Nursing Contact information: [address] [place] [phone]                      The results of significant diagnostics from this hospitalization (including imaging, microbiology, ancillary  and laboratory) are listed below for reference.    Significant Diagnostic Studies: CT HEAD WO CONTRAST (5MM)  Result Date: 12/27/2020 CLINICAL DATA:  Mental status change EXAM: CT HEAD WITHOUT CONTRAST TECHNIQUE: Contiguous axial images were obtained from the base of the skull through the vertex without intravenous contrast. COMPARISON:  None. FINDINGS: Brain: Hypodensity in the pons and right cerebellar hemisphere (series 2, images 11 and 10). No acute hemorrhage, mass, mass effect, or midline shift. Degree of central volume loss is likely at the upper limits of normal for age. Periventricular white matter changes, likely the sequela of chronic small vessel ischemic disease. No extra-axial collection or hydrocephalus. Vascular: No hyperdense vessel. Skull: No acute osseous abnormality. Sinuses/Orbits: Left phthisis bulbi. Normal right globe. The sinuses are unremarkable. Other: Trace fluid in the mastoid air cells. IMPRESSION: Hypodensity in the pons and right cerebellar hemisphere, of indeterminate acuity the. Consider MRI for further evaluation if clinically  indicated. These results were called by telephone at the time of interpretation on 12/27/2020 at 3:48 pm to provider [facility] , who verbally acknowledged these results. Electronically Signed   By: Merilyn Baba M.D.   On: 12/27/2020 15:48   MR BRAIN WO CONTRAST  Result Date: 12/27/2020 CLINICAL DATA:  Delirium abnormal CT EXAM: MRI HEAD WITHOUT CONTRAST TECHNIQUE: Multiplanar, multiecho pulse sequences of the brain and surrounding structures were obtained without intravenous contrast. COMPARISON:  Same day CT head. FINDINGS: Mildly motion limited study. Brain: No acute infarction, hemorrhage, hydrocephalus, extra-axial collection or mass lesion. Moderate patchy T2/FLAIR hyperintensity within the supratentorial and pontine white matter, nonspecific but compatible with chronic microvascular ischemic disease. Moderate atrophy with ex vacuo  ventricular dilation. Punctate foci of susceptibility artifact in the left cerebellum and right frontal lobe, probably chronic microhemorrhages. Vascular: Major arterial flow voids are maintained at the skull base. Skull and upper cervical spine: Normal marrow signal. Sinuses/Orbits: Clear sinuses.  Left phthisis bulbi. Other: Trace mastoid effusions. IMPRESSION: 1. No evidence of acute intracranial abnormality. 2. Moderate chronic microvascular ischemic disease and atrophy. Electronically Signed   By: Margaretha Sheffield M.D.   On: 12/27/2020 18:14   DG Chest Port 1 View  Result Date: 12/28/2020 CLINICAL DATA:  Altered mental status. EXAM: PORTABLE CHEST 1 VIEW COMPARISON:  December 22, 2020. FINDINGS: Marked cardiomegaly is noted. Left lung is clear. Mild right basilar atelectasis is noted with small right pleural effusion. Bony thorax is unremarkable. IMPRESSION: Marked cardiomegaly. Mild right basilar atelectasis is noted with small right pleural effusion. Electronically Signed   By: Marijo Conception M.D.   On: 12/28/2020 14:59   DG Chest Portable 1 View  Result Date: 12/22/2020 CLINICAL DATA:  Evaluate for pneumonia. EXAM: PORTABLE CHEST 1 VIEW COMPARISON:  Chest x-ray 10/07/2014. FINDINGS: Cardiac silhouette is markedly enlarged and has increased in size. The lungs are clear. There is no pleural effusion or pneumothorax. No acute fractures are seen. IMPRESSION: 1. Marked enlargement of the cardiac silhouette. This has increased in size. Findings may related to pericardial effusion and/or worsening cardiomegaly. Electronically Signed   By: Ronney Asters M.D.   On: 12/22/2020 19:02   ECHOCARDIOGRAM COMPLETE  Result Date: 12/23/2020    ECHOCARDIOGRAM REPORT   Patient Name:   JASIEL BELISLE Date of Exam: 12/23/2020 Medical Rec #:  [id number]     Height:       73.0 in Accession #:    [id number]    Weight:       152.1 lb Date of Birth:  [birth year]      BSA:          1.915 m Patient Age:    36 years      BP:            130/103 mmHg Patient Gender: M             HR:           88 bpm. Exam Location:  Inpatient Procedure: 2D Echo, 3D Echo, Color Doppler and Cardiac Doppler Indications:    I31.3 Pericardial effusion (noninflammatory)  History:        Patient has prior history of Echocardiogram examinations, most                 recent 05/03/2009. CHF, Abnormal ECG, Mitral Valve Disease;                 Arrythmias:Atrial Fibrillation. ETOH. Cancer. Mild MVP.  Sonographer:  [place] Referring Phys: [id number] [place]  1. Left ventricular ejection fraction, by estimation, is 35 to 40%. The left ventricle has moderately decreased function. The left ventricle demonstrates global hypokinesis. There is moderate left ventricular hypertrophy. Left ventricular diastolic parameters are indeterminate.  2. Right ventricular systolic function is moderately reduced. The right ventricular size is severely enlarged. There is mildly elevated pulmonary artery systolic pressure. The estimated right ventricular systolic pressure is 58.0 mmHg.  3. Left atrial size was severely dilated.  4. Right atrial size was severely dilated.  5. A small to moderte pericardial effusion is present, measures 71mm adjacent to LV inferior wall. There is no evidence of cardiac tamponade.  6. The mitral valve is abnormal. Moderate to severe mitral valve regurgitation.  7. The tricuspid valve is abnormal. Tricuspid valve regurgitation is severe.  8. The aortic valve is tricuspid. Aortic valve regurgitation is mild. Aortic valve sclerosis/calcification is present, without any evidence of aortic stenosis.  9. Aortic dilatation noted. There is dilatation of the ascending aorta, measuring 39 mm. 10. The inferior vena cava is dilated in size with <50% respiratory variability, suggesting right atrial pressure of 15 mmHg. FINDINGS  Left Ventricle: Left ventricular ejection fraction, by estimation, is 35 to 40%. The left ventricle has moderately decreased  function. The left ventricle demonstrates global hypokinesis. The left ventricular internal cavity size was normal in size. There is moderate left ventricular hypertrophy. Left ventricular diastolic parameters are indeterminate. Right Ventricle: The right ventricular size is severely enlarged. No increase in right ventricular wall thickness. Right ventricular systolic function is moderately reduced. There is mildly elevated pulmonary artery systolic pressure. The tricuspid regurgitant velocity is 2.70 m/s, and with an assumed right atrial pressure of 15 mmHg, the estimated right ventricular systolic pressure is 99.8 mmHg. Left Atrium: Left atrial size was severely dilated. Right Atrium: Right atrial size was severely dilated. Pericardium: A small pericardial effusion is present. There is no evidence of cardiac tamponade. Mitral Valve: The mitral valve is abnormal. Moderate to severe mitral valve regurgitation. Tricuspid Valve: The tricuspid valve is abnormal. Tricuspid valve regurgitation is severe. Aortic Valve: The aortic valve is tricuspid. Aortic valve regurgitation is mild. Aortic regurgitation PHT measures 721 msec. Aortic valve sclerosis/calcification is present, without any evidence of aortic stenosis. Pulmonic Valve: The pulmonic valve was grossly normal. Pulmonic valve regurgitation is mild. Aorta: The aortic root is normal in size and structure and aortic dilatation noted. There is dilatation of the ascending aorta, measuring 39 mm. Venous: The inferior vena cava is dilated in size with less than 50% respiratory variability, suggesting right atrial pressure of 15 mmHg. IAS/Shunts: The interatrial septum was not well visualized.  LEFT VENTRICLE PLAX 2D LVIDd:         5.20 cm LVIDs:         4.70 cm LV PW:         1.30 cm LV IVS:        1.30 cm LVOT diam:     2.30 cm      3D Volume EF: LV SV:         40           3D EF:        39 % LV SV Index:   21           LV EDV:       222 ml LVOT Area:     4.15 cm      LV ESV:  135 ml                             LV SV:        87 ml  LV Volumes (MOD) LV vol d, MOD A2C: 167.0 ml LV vol d, MOD A4C: 171.0 ml LV vol s, MOD A2C: 121.0 ml LV vol s, MOD A4C: 100.0 ml LV SV MOD A2C:     46.0 ml LV SV MOD A4C:     171.0 ml LV SV MOD BP:      62.7 ml RIGHT VENTRICLE            IVC RV S prime:     3.87 cm/s  IVC diam: 3.70 cm TAPSE (M-mode): 1.2 cm LEFT ATRIUM              Index        RIGHT ATRIUM           Index LA diam:        5.40 cm  2.82 cm/m   RA Area:     55.70 cm LA Vol (A2C):   191.0 ml 99.72 ml/m  RA Volume:   283.00 ml 147.75 ml/m LA Vol (A4C):   109.0 ml 56.91 ml/m LA Biplane Vol: 159.0 ml 83.01 ml/m  AORTIC VALVE             PULMONIC VALVE LVOT Vmax:   82.20 cm/s  PR End Diast Vel: 2.43 msec LVOT Vmean:  47.550 cm/s LVOT VTI:    0.096 m AI PHT:      721 msec  AORTA Ao Root diam: 3.80 cm Ao Asc diam:  3.90 cm MITRAL VALVE                  TRICUSPID VALVE MV Area (PHT): 5.82 cm       TR Peak grad:   29.2 mmHg MV Decel Time: 130 msec       TR Vmax:        270.00 cm/s MR Peak grad:    116.2 mmHg MR Mean grad:    62.5 mmHg    SHUNTS MR Vmax:         539.00 cm/s  Systemic VTI:  0.10 m MR Vmean:        357.5 cm/s   Systemic Diam: 2.30 cm MR PISA:         2.26 cm MR PISA Eff ROA: 16 mm MR PISA Radius:  0.60 cm MV E velocity: 79.55 cm/s Oswaldo Milian MD Electronically signed by Oswaldo Milian MD Signature Date/Time: 12/23/2020/2:23:00 PM    Final    [place] ASCITES (ABDOMEN LIMITED)  Result Date: 12/29/2020 CLINICAL DATA:  Evaluate for paracentesis. EXAM: LIMITED ABDOMEN ULTRASOUND FOR ASCITES TECHNIQUE: Limited ultrasound survey for ascites was performed in all four abdominal quadrants. COMPARISON:  Ultrasound 12/22/2020 FINDINGS: Trace perihepatic ascites. Minimal fluid in the right lower quadrant, left upper quadrant and left lower quadrant. IMPRESSION: Small amount of ascites. Paracentesis was not performed due to the small amount of fluid. Electronically  Signed   By: Markus Daft M.D.   On: 12/29/2020 17:21   US Abdomen Limited RUQ (LIVER/GB)  Result Date: 12/22/2020 CLINICAL DATA:  Elevated bilirubin EXAM: ULTRASOUND ABDOMEN LIMITED RIGHT UPPER QUADRANT COMPARISON:  CT 03/30/2014 FINDINGS: Gallbladder: No gallstones or wall thickening visualized. No sonographic Murphy sign noted by sonographer. Common bile duct: Diameter: 3 mm Liver: Nodular liver contour suspicious for cirrhosis.  No focal hepatic mass lesion is seen. There is antegrade flow in the main portal vein. Other: Multiple cysts in the right kidney. 5.8 by 5.7 x 4.8 cm mildly complicated right renal cyst contains a small amount of internal debris. IMPRESSION: 1. Cirrhotic morphology of the liver with ascites in the right upper quadrant 2. Negative for gallstones or biliary dilatation 3. Multiple right renal cysts with slightly complex exophytic cyst off the lower pole of right kidney. Dedicated renal sonogram may be obtained for more complete assessment. Electronically Signed   By: Donavan Foil M.D.   On: 12/22/2020 22:58    Microbiology: Recent Results (from the past 240 hour(s))  Urine Culture     Status: None   Collection Time: 12/28/20  2:35 PM   Specimen: In/Out Cath Urine  Result Value Ref Range Status   Specimen Description   Final    IN/OUT CATH URINE Performed at [facility], [place] [address], [place], [place] [address]    Special Requests   Final    NONE Performed at [facility], [place] [address] [place], [place] [address]    Culture   Final    NO GROWTH Performed at [facility], [place] [address] [place], [place] [address]    Report Status 12/30/2020 FINAL  Final  Culture, blood (routine x 2)     Status: None   Collection Time: 12/28/20  3:06 PM   Specimen: BLOOD  Result Value Ref Range Status   Specimen Description   Final    BLOOD LEFT ANTECUBITAL Performed at [facility] [address],  [place], [place] [address]    Special Requests   Final    BOTTLES DRAWN AEROBIC ONLY Blood Culture results may not be optimal due to an inadequate volume of blood received in culture bottles Performed at [facility] [address], [place], [place] [address]    Culture   Final    NO GROWTH 5 DAYS Performed at [facility], [place] [address] [place], [place] [address]    Report Status 01/02/2021 FINAL  Final  Culture, blood (routine x 2)     Status: None   Collection Time: 12/28/20  3:06 PM   Specimen: BLOOD  Result Value Ref Range Status   Specimen Description   Final    BLOOD BLOOD LEFT WRIST Performed at [facility] [address], [place], [place] [address]    Special Requests   Final    BOTTLES DRAWN AEROBIC ONLY Blood Culture results may not be optimal due to an inadequate volume of blood received in culture bottles Performed at [facility] [address] [place], [place] [address]    Culture   Final    NO GROWTH 5 DAYS Performed at [facility], [place] [address], [place], [place] [address]    Report Status 01/02/2021 FINAL  Final  MRSA Next Gen by PCR, Nasal     Status: None   Collection Time: 12/28/20  8:18 PM   Specimen: Nasal Mucosa; Nasal Swab  Result Value Ref Range Status   MRSA by PCR Next Gen NOT DETECTED NOT DETECTED Final    Comment: (NOTE) The GeneXpert MRSA Assay (FDA approved for NASAL specimens only), is one component of a comprehensive MRSA colonization surveillance program. It is not intended to diagnose MRSA infection nor to guide or monitor treatment for MRSA infections. Test performance is not FDA approved  in patients less than 34 years old. Performed at [facility], [place] [address] [place], [place] [address]   Resp Panel by RT-PCR (Flu A&B, Covid) Nasopharyngeal Swab     Status: None   Collection Time: 01/02/21  1:08 PM   Specimen: Nasopharyngeal Swab;  Nasopharyngeal(NP) swabs in vial transport medium  Result Value Ref Range Status   SARS Coronavirus 2 by RT PCR NEGATIVE NEGATIVE Final    Comment: (NOTE) SARS-CoV-2 target nucleic acids are NOT DETECTED.  The SARS-CoV-2 RNA is generally detectable in upper respiratory specimens during the acute phase of infection. The lowest concentration of SARS-CoV-2 viral copies this assay can detect is 138 copies/mL. A negative result does not preclude SARS-Cov-2 infection and should not be used as the sole basis for treatment or other patient management decisions. A negative result may occur with  improper specimen collection/handling, submission of specimen other than nasopharyngeal swab, presence of viral mutation(s) within the areas targeted by this assay, and inadequate number of viral copies(<138 copies/mL). A negative result must be combined with clinical observations, patient history, and epidemiological information. The expected result is Negative.  Fact Sheet for Patients:  EntrepreneurPulse.com.au  Fact Sheet for Healthcare Providers:  IncredibleEmployment.be  This test is no t yet approved or cleared by the [place] FDA and  has been authorized for detection and/or diagnosis of SARS-CoV-2 by FDA under an Emergency Use Authorization (EUA). This EUA will remain  in effect (meaning this test can be used) for the duration of the COVID-19 declaration under Section 564(b)(1) of the Act, 21 U.S.C.section 360bbb-3(b)(1), unless the authorization is terminated  or revoked sooner.       Influenza A by PCR NEGATIVE NEGATIVE Final   Influenza B by PCR NEGATIVE NEGATIVE Final    Comment: (NOTE) The Xpert Xpress SARS-CoV-2/FLU/RSV plus assay is intended as an aid in the diagnosis of influenza from Nasopharyngeal swab specimens and should not be used as a sole basis for treatment. Nasal washings and aspirates are unacceptable for Xpert Xpress  SARS-CoV-2/FLU/RSV testing.  Fact Sheet for Patients: EntrepreneurPulse.com.au  Fact Sheet for Healthcare Providers: IncredibleEmployment.be  This test is not yet approved or cleared by the [place] FDA and has been authorized for detection and/or diagnosis of SARS-CoV-2 by FDA under an Emergency Use Authorization (EUA). This EUA will remain in effect (meaning this test can be used) for the duration of the COVID-19 declaration under Section 564(b)(1) of the Act, 21 U.S.C. section 360bbb-3(b)(1), unless the authorization is terminated or revoked.  Performed at [facility], [place] [address] [place], [place] [address]      Labs: Basic Metabolic Panel: Recent Labs  Lab 12/29/20 0251 12/30/20 0311 12/31/20 0549 01/01/21 0606 01/02/21 0522  NA 143 142 140 138 137  K 4.1 4.3 3.7 3.8 3.9  CL 112* 109 107 105 104  CO2 23 24 25 26 25   GLUCOSE 91 79 76 80 76  BUN 39* 40* 41* 39* 34*  CREATININE 1.85* 1.90* 1.78* 1.33* 1.14  CALCIUM 8.9 9.0 8.8* 8.8* 8.7*   Liver Function Tests: Recent Labs  Lab 12/29/20 0251 12/30/20 0311 12/31/20 0549 01/01/21 0606 01/02/21 0522  AST 69* 70* 59* 57* 60*  ALT 43 45* 41 40 42  ALKPHOS 136* 152* 153* 159* 154*  BILITOT 2.2* 2.9* 2.8* 2.3* 2.6*  PROT 6.7 7.1 7.0 6.9 6.6  ALBUMIN 2.1* 2.2* 2.2* 2.0* 2.1*   No results for input(s): LIPASE, AMYLASE in the last 168 hours.  Recent Labs  Lab 12/29/20 1703  AMMONIA 34   CBC: Recent Labs  Lab 12/29/20 0251 12/30/20 0311 12/31/20 0549 01/01/21 0606 01/02/21 0522  WBC 5.0 4.0 3.8* 2.8* 2.7*  NEUTROABS 4.4 3.4 3.2 2.2 2.1  HGB 9.0* 10.0* 9.5* 9.5* 9.3*  HCT 28.4* 30.2* 28.7* 28.6* 27.4*  MCV 86.6 84.6 84.4 84.1 83.0  PLT 99* 95* 110* 110* 112*   Cardiac Enzymes: Recent Labs  Lab 12/31/20 0549  CKTOTAL 89   BNP: BNP (last 3 results) Recent Labs    07/04/20 1032 12/30/20 0311  BNP 558.9* 1,654.9*    ProBNP (last  3 results) No results for input(s): PROBNP in the last 8760 hours.  CBG: Recent Labs  Lab 12/28/20 1312 12/29/20 0747  GLUCAP 121* 85       Signed:  Kayleen Memos, MD [facility] 01/04/2021, 12:20 PM

## 2021-01-04 NOTE — TOC Transition Note (Signed)
Transition of Care [facility]) - CM/SW Discharge Note   Patient Details  Name: Tommy Morris MRN: [id number] Date of Birth: 03/12/1940  Transition of Care [facility]) CM/SW Contact:  Trish Mage, LCSW Phone Number: 01/04/2021, 1:37 PM   Clinical Narrative:   Patient who is stable for d/c will transfer to [facility] today.  Family alerted.  PTAR arranged.  Nursing, please call report to [phone], room 120B. TOC sign off.    Final next level of care: Skilled Nursing Facility Barriers to Discharge: Barriers Resolved   Patient Goals and CMS Choice        Discharge Placement                       Discharge Plan and Services                                     Social Determinants of Health (SDOH) Interventions     Readmission Risk Interventions No flowsheet data found.

## 2021-01-09 ENCOUNTER — Encounter (HOSPITAL_COMMUNITY): Payer: Self-pay

## 2021-01-09 ENCOUNTER — Other Ambulatory Visit: Payer: Self-pay

## 2021-01-09 ENCOUNTER — Emergency Department (HOSPITAL_COMMUNITY)
Admission: EM | Admit: 2021-01-09 | Discharge: 2021-01-10 | Disposition: A | Discharge status: Home / Self Care | Payer: Medicare Other | Attending: Emergency Medicine | Admitting: Emergency Medicine

## 2021-01-09 ENCOUNTER — Emergency Department (HOSPITAL_COMMUNITY): Payer: Medicare Other

## 2021-01-09 ENCOUNTER — Encounter: Payer: Medicare Other | Admitting: Student in an Organized Health Care Education/Training Program

## 2021-01-09 DIAGNOSIS — Z79899 Other long term (current) drug therapy: Secondary | ICD-10-CM | POA: Diagnosis not present

## 2021-01-09 DIAGNOSIS — T839XXA Unspecified complication of genitourinary prosthetic device, implant and graft, initial encounter: Secondary | ICD-10-CM

## 2021-01-09 DIAGNOSIS — Z87891 Personal history of nicotine dependence: Secondary | ICD-10-CM | POA: Diagnosis not present

## 2021-01-09 DIAGNOSIS — R319 Hematuria, unspecified: Secondary | ICD-10-CM | POA: Diagnosis present

## 2021-01-09 DIAGNOSIS — Z7982 Long term (current) use of aspirin: Secondary | ICD-10-CM | POA: Diagnosis not present

## 2021-01-09 DIAGNOSIS — Y846 Urinary catheterization as the cause of abnormal reaction of the patient, or of later complication, without mention of misadventure at the time of the procedure: Secondary | ICD-10-CM | POA: Diagnosis not present

## 2021-01-09 DIAGNOSIS — I5022 Chronic systolic (congestive) heart failure: Secondary | ICD-10-CM | POA: Diagnosis not present

## 2021-01-09 DIAGNOSIS — I110 Hypertensive heart disease with heart failure: Secondary | ICD-10-CM | POA: Diagnosis not present

## 2021-01-09 DIAGNOSIS — T83091A Other mechanical complication of indwelling urethral catheter, initial encounter: Secondary | ICD-10-CM | POA: Insufficient documentation

## 2021-01-09 DIAGNOSIS — Z8551 Personal history of malignant neoplasm of bladder: Secondary | ICD-10-CM | POA: Diagnosis not present

## 2021-01-09 LAB — CBC
HCT: 28.7 % — ABNORMAL LOW (ref 39.0–52.0)
Hemoglobin: 9.1 g/dL — ABNORMAL LOW (ref 13.0–17.0)
MCH: 28.2 pg (ref 26.0–34.0)
MCHC: 31.7 g/dL (ref 30.0–36.0)
MCV: 88.9 fL (ref 80.0–100.0)
Platelets: 149 10*3/uL — ABNORMAL LOW (ref 150–400)
RBC: 3.23 MIL/uL — ABNORMAL LOW (ref 4.22–5.81)
RDW: 23.1 % — ABNORMAL HIGH (ref 11.5–15.5)
WBC: 9.4 10*3/uL (ref 4.0–10.5)
nRBC: 0 % (ref 0.0–0.2)

## 2021-01-09 LAB — BASIC METABOLIC PANEL
Anion gap: 12 (ref 5–15)
BUN: 21 mg/dL (ref 8–23)
CO2: 22 mmol/L (ref 22–32)
Calcium: 8.4 mg/dL — ABNORMAL LOW (ref 8.9–10.3)
Chloride: 105 mmol/L (ref 98–111)
Creatinine, Ser: 1.15 mg/dL (ref 0.61–1.24)
GFR, Estimated: >60 mL/min (ref >60)
Glucose, Bld: 72 mg/dL (ref 70–99)
Potassium: 4.1 mmol/L (ref 3.5–5.1)
Sodium: 139 mmol/L (ref 135–145)

## 2021-01-09 LAB — URINALYSIS, ROUTINE W REFLEX MICROSCOPIC
Bacteria, UA: NONE SEEN
Bilirubin Urine: NEGATIVE
Glucose, UA: NEGATIVE mg/dL
Ketones, ur: NEGATIVE mg/dL
Leukocytes,Ua: NEGATIVE
Nitrite: NEGATIVE
Protein, ur: 100 mg/dL — AB
RBC / HPF: >50 RBC/hpf — ABNORMAL HIGH (ref 0–5)
Specific Gravity, Urine: 1.019 (ref 1.005–1.030)
pH: 6 (ref 5.0–8.0)

## 2021-01-09 LAB — PROTIME-INR
INR: 1.5 — ABNORMAL HIGH (ref 0.8–1.2)
Prothrombin Time: 18.5 seconds — ABNORMAL HIGH (ref 11.4–15.2)

## 2021-01-09 NOTE — Discharge Instructions (Signed)
Continue routine foley catheter care.  You may see some bleeding around the catheter but it should not be persistent and copious.  The catheter is seated in the bladder and draining well at this time.  Patient to follow-up with urology in 1 to 2 weeks.

## 2021-01-09 NOTE — ED Triage Notes (Signed)
Per EMS- Patient is a resident of [facility]. Patient pulled his foley cath out approx 30 minutes ago. Staff at [facility] states that they have had to change multiple briefs with blood in them. Staff reports that the patient is at normal cognitive baseline.

## 2021-01-09 NOTE — ED Notes (Signed)
PTAR notified.  ?

## 2021-01-09 NOTE — ED Provider Notes (Signed)
Emergency Medicine Provider Triage Evaluation Note  Tommy Morris , a 80 y.o. male  was evaluated in triage.  Pt complains of hematuria.  Patient from [facility].  It was reported that he pulled out his Foley catheter about 30 minutes prior to arrival.  He had several briefs full of blood afterwards.  No other complaints and patient is reportedly at his baseline per staff.  Review of Systems  Positive: Hematuria Negative: Pain  Physical Exam  BP 119/81 (BP Location: Right Arm)    Temp 97.6 F (36.4 C) (Oral)    Resp 18    Ht 6\' 1"  (1.854 m)    Wt 69 kg    BMI 20.07 kg/m  Gen:   Awake, no distress   Resp:  Normal effort  MSK:   Moves extremities without difficulty  Other:    Medical Decision Making  Medically screening exam initiated at 1:08 PM.  Appropriate orders placed.  Erin Hearing was informed that the remainder of the evaluation will be completed by another provider, this initial triage assessment does not replace that evaluation, and the importance of remaining in the ED until their evaluation is complete.     Carlisle Cater, PA-C 01/09/21 1309    Lacretia Leigh, MD 01/11/21 [phone]

## 2021-01-09 NOTE — ED Provider Notes (Signed)
[facility] DEPT Provider Note   CSN: [id number] Arrival date & time: 01/09/21  1253     History Chief Complaint  Patient presents with   Hematuria    Tommy Morris is a 80 y.o. male.  HPI Patient has history of bladder cancer treated in 2014 by Dr. Jeffie Pollock a transurethral resection.  Patient pulled out his chronic indwelling Foley catheter at SNF.  There was a lot of bleeding afterwards.  Patient sent for evaluation.  Reportedly this happened just prior to arrival. He is denying any pain.  He has no specific complaint.  Although there has been significant bleeding, he denies he is having pain in his penis or testicles or abdomen.    Past Medical History:  Diagnosis Date   Atrial fibrillation ([place]) chronic    echo 05/03/2009 mildly to moderately reduced LV systolic function ef 58-85%  Patient declines anticoagulation for stroke prophylaxis.   BPH (benign prostatic hypertrophy) with urinary obstruction    Diverticulosis of colon    History of bladder cancer    2006  S/P TURBT  UROTHELIAL CARCINOMA   History of gout    History of urinary retention    LAST ISSUE 11/2012 W/ ARF   Hypertension    Mild mitral valve prolapse    Mitral valve regurgitation    OA (osteoarthritis) of knee    Pulmonary nodule    Two small right lung nodules on chest CT 04/25/2009    Patient Active Problem List   Diagnosis Date Noted   Pericardial effusion 12/22/2020   AKI (acute kidney injury) ([place]) 12/22/2020   Health care maintenance 09/14/2019   Elevated bilirubin 03/19/2016   Gout 07/25/2015   Blindness of left eye 07/25/2015   Dermatitis 07/25/2015   Counseling regarding advanced directives and goals of care 02/77/4128   Chronic systolic heart failure ([place]) 03/28/2015   Alcohol use disorder 05/04/2009   Atrial fibrillation ([place]) 04/27/2009   Anemia 04/24/2009   Malignant neoplasm of bladder ([place]) 02/01/2006   Essential hypertension 02/01/2006    Past  Surgical History:  Procedure Laterality Date   CYSTOSCOPY WITH RETROGRADE PYELOGRAM, URETEROSCOPY AND STENT PLACEMENT Bilateral 01/06/2013   Procedure: CYSTOSCOPY WITH BILATERAL RETROGRADE PYELOGRAM, ;  Surgeon: Irine Seal, MD;  Location: [facility];  Service: Urology;  Laterality: Bilateral;   TONSILLECTOMY  AGE 56   TRANSTHORACIC ECHOCARDIOGRAM  05-03-2009   DIFFUSE HYPOKINESIS/ EF 40-45%/  MILD MVP WITH MILD TO MODERATE MR/  MILDLY DILATED LA & RA   TRANSURETHRAL RESECTION OF BLADDER TUMOR  10-26-2004   PLACEMENT RIGHT URETERAL STENT AND TRANSRECTAL PROSTATE BX   TRANSURETHRAL RESECTION OF BLADDER TUMOR WITH GYRUS (TURBT-GYRUS) N/A 01/06/2013   Procedure: TRANSURETHRAL RESECTION OF BLADDER TUMOR WITH GYRUS (TURBT-GYRUS);  Surgeon: Irine Seal, MD;  Location: [facility];  Service: Urology;  Laterality: N/A;   TRANSURETHRAL RESECTION OF PROSTATE N/A 01/06/2013   Procedure: TRANSURETHRAL RESECTION OF THE PROSTATE (TURP);  Surgeon: Irine Seal, MD;  Location: [facility];  Service: Urology;  Laterality: N/A;       Family History  Problem Relation Age of Onset   Alzheimer's disease Mother        Died in 56 at age 53.   Cancer Neg Hx    Diabetes Neg Hx    Hypertension Neg Hx    Heart disease Neg Hx     Social History   Tobacco Use   Smoking status: Former    Packs/day: 0.20  Years: 50.00    Pack years: 10.00    Types: Cigarettes    Quit date: 01/22/2002    Years since quitting: 18.9   Smokeless tobacco: Former    Types: Chew    Quit date: 01/23/1964  Vaping Use   Vaping Use: Never used  Substance Use Topics   Alcohol use: Not Currently    Alcohol/week: 21.0 standard drinks    Types: 21 Standard drinks or equivalent per week    Comment: Average of about 3 drinks per day.   Drug use: No    Home Medications Prior to Admission medications   Medication Sig Start Date End Date Taking? Authorizing Provider  aspirin 81 MG EC tablet  Take 1 tablet (81 mg total) by mouth daily. 09/14/19   Masoudi, Dorthula Rue, MD  feeding supplement (ENSURE ENLIVE / ENSURE PLUS) LIQD Take 237 mLs by mouth 3 (three) times daily between meals for 7 days. 01/02/21 01/09/21  Kayleen Memos, DO  ferrous sulfate 325 (65 FE) MG tablet Take 1 tablet (325 mg total) by mouth daily with breakfast. 01/03/21 04/03/21  Kayleen Memos, DO  folic acid (FOLVITE) 1 MG tablet Take 1 tablet (1 mg total) by mouth daily. 01/02/21 04/02/21  Kayleen Memos, DO  furosemide (LASIX) 20 MG tablet Take 1 tablet (20 mg total) by mouth 2 (two) times daily. 08/17/20 08/17/21  Angelica Pou, MD  lactulose (CHRONULAC) 10 GM/15ML solution Take 15 mLs (10 g total) by mouth 2 (two) times daily. 01/02/21   Kayleen Memos, DO  Multiple Vitamin (MULTIVITAMIN WITH MINERALS) TABS tablet Take 1 tablet by mouth daily. 01/02/21 04/02/21  Kayleen Memos, DO  potassium chloride (KLOR-CON M) 10 MEQ tablet Take 1 tablet (10 mEq total) by mouth daily. 01/02/21 02/01/21  Kayleen Memos, DO  Skin Protectants, Misc. (EUCERIN) cream Apply topically as needed for dry skin. Patient taking differently: Apply 1 application topically daily as needed for dry skin. 02/05/17   Axel Filler, MD    Allergies    Patient has no known allergies.  Review of Systems   Review of Systems Constitutional: No fever no chills GI: No abdominal pain no nausea no vomiting Physical Exam Updated Vital Signs BP (!) 125/109 (BP Location: Left Arm)    Pulse 66    Temp 97.6 F (36.4 C) (Oral)    Resp 14    Ht 6\' 1"  (1.854 m)    Wt 69 kg    SpO2 94%    BMI 20.07 kg/m   Physical Exam Constitutional:      Comments: Alert.  No respiratory distress  Pulmonary:     Effort: Pulmonary effort is normal.  Abdominal:     General: There is no distension.     Palpations: Abdomen is soft.     Tenderness: There is no abdominal tenderness. There is no guarding.  Genitourinary:    Comments: Foley catheter is in the  meatus.  There is fresh blood around the meatus but does not appear to be briskly bleeding.  There is substantial amount of blood in the diaper that had just been replaced after fresh catheter. Musculoskeletal:        General: No swelling.  Skin:    General: Skin is warm and dry.  Neurological:     Comments: Alert.    ED Results / Procedures / Treatments   Labs (all labs ordered are listed, but only abnormal results are displayed) Labs Reviewed  BASIC METABOLIC PANEL -  Abnormal; Notable for the following components:      Result Value   Calcium 8.4 (*)    All other components within normal limits  CBC - Abnormal; Notable for the following components:   RBC 3.23 (*)    Hemoglobin 9.1 (*)    HCT 28.7 (*)    RDW 23.1 (*)    Platelets 149 (*)    All other components within normal limits  PROTIME-INR - Abnormal; Notable for the following components:   Prothrombin Time 18.5 (*)    INR 1.5 (*)    All other components within normal limits    EKG None  Radiology US Abdomen Limited  Result Date: 01/09/2021 CLINICAL DATA:  Hematuria. EXAM: ULTRASOUND ABDOMEN LIMITED COMPARISON:  None. FINDINGS: The insufflator bulb of a Foley catheter is seen within an empty urinary bladder. As a result, the urinary bladder is markedly limited in evaluation. A marked amount of ascites is seen. IMPRESSION: 1. Limited evaluation of the urinary bladder secondary to the presence of a Foley catheter. 2. Ascites. Electronically Signed   By: Virgina Norfolk M.D.   On: 01/09/2021 19:42    Procedures Procedures  Coud catheter placement by MD. Sterile prep and dressing used. Lidocaine jelly for catheter insertion. 81 French coud catheter passed without difficulty.  Return of blood-tinged urine. Medications Ordered in ED Medications - No data to display  ED Course  I have reviewed the triage vital signs and the nursing notes.  Pertinent labs & imaging results that were available during my care of the  patient were reviewed by me and considered in my medical decision making (see chart for details).    MDM Rules/Calculators/A&P                         Patient presents for having traumatically pulled out his Foley catheter with essential bleeding at SNF.  Catheter was replaced by nursing staff but continues to have substantial bleeding around the catheter.  We will try irrigating the catheter and see if bleeding stops.  Patient is not on anticoagulants.  He is not in any distress.  Consult: Reviewed with Dr. Jeffie Pollock.  Suggest trying an 18 coud and then getting formal ultrasound to assess placement.  After coud catheter placed, patient had no pain complaints.  Rusty colored urine continued to pass.  Ultrasound confirmed Foley catheter in bladder.  Rechecked catheter and there is no further bleeding around the catheter.  Patient is comfortable with no complaints.  He is eating and drinking.  Will add urine culture for persistent rusty appearing urine after draining the bladder.   Final Clinical Impression(s) / ED Diagnoses Final diagnoses:  Hematuria  Complication of Foley catheter, initial encounter [facility])    Rx / [place] Orders ED Discharge Orders     None        Charlesetta Shanks, MD 01/09/21 2008

## 2021-01-10 NOTE — ED Notes (Signed)
Report called and given to [facility] LPN at [facility].

## 2021-01-11 LAB — URINE CULTURE: Culture: 80000 — AB

## 2021-01-12 ENCOUNTER — Telehealth: Payer: Self-pay

## 2021-01-12 NOTE — Progress Notes (Signed)
ED Antimicrobial Stewardship Positive Culture Follow Up   Tommy Morris is an 80 y.o. male who presented to [facility] on 01/09/2021 with a chief complaint of  Chief Complaint  Patient presents with   Hematuria    Recent Results (from the past 720 hour(s))  Urine Culture     Status: Abnormal   Collection Time: 12/22/20  6:17 PM   Specimen: Urine, Clean Catch  Result Value Ref Range Status   Specimen Description   Final    URINE, CLEAN CATCH Performed at [facility], [place] [address], [place], [place] [address]    Special Requests   Final    NONE Performed at [facility], [place] [address] [place], [place] [address]    Culture (A)  Final    <10,000 COLONIES/mL INSIGNIFICANT GROWTH Performed at [facility] [address] [place], [place] [address]    Report Status 12/25/2020 FINAL  Final  Resp Panel by RT-PCR (Flu A&B, Covid) Nasopharyngeal Swab     Status: None   Collection Time: 12/22/20 10:17 PM   Specimen: Nasopharyngeal Swab; Nasopharyngeal(NP) swabs in vial transport medium  Result Value Ref Range Status   SARS Coronavirus 2 by RT PCR NEGATIVE NEGATIVE Final    Comment: (NOTE) SARS-CoV-2 target nucleic acids are NOT DETECTED.  The SARS-CoV-2 RNA is generally detectable in upper respiratory specimens during the acute phase of infection. The lowest concentration of SARS-CoV-2 viral copies this assay can detect is 138 copies/mL. A negative result does not preclude SARS-Cov-2 infection and should not be used as the sole basis for treatment or other patient management decisions. A negative result may occur with  improper specimen collection/handling, submission of specimen other than nasopharyngeal swab, presence of viral mutation(s) within the areas targeted by this assay, and inadequate number of viral copies(<138 copies/mL). A negative result must be combined with clinical observations, patient history, and  epidemiological information. The expected result is Negative.  Fact Sheet for Patients:  EntrepreneurPulse.com.au  Fact Sheet for Healthcare Providers:  IncredibleEmployment.be  This test is no t yet approved or cleared by the [place] FDA and  has been authorized for detection and/or diagnosis of SARS-CoV-2 by FDA under an Emergency Use Authorization (EUA). This EUA will remain  in effect (meaning this test can be used) for the duration of the COVID-19 declaration under Section 564(b)(1) of the Act, 21 U.S.C.section 360bbb-3(b)(1), unless the authorization is terminated  or revoked sooner.       Influenza A by PCR NEGATIVE NEGATIVE Final   Influenza B by PCR NEGATIVE NEGATIVE Final    Comment: (NOTE) The Xpert Xpress SARS-CoV-2/FLU/RSV plus assay is intended as an aid in the diagnosis of influenza from Nasopharyngeal swab specimens and should not be used as a sole basis for treatment. Nasal washings and aspirates are unacceptable for Xpert Xpress SARS-CoV-2/FLU/RSV testing.  Fact Sheet for Patients: EntrepreneurPulse.com.au  Fact Sheet for Healthcare Providers: IncredibleEmployment.be  This test is not yet approved or cleared by the [place] FDA and has been authorized for detection and/or diagnosis of SARS-CoV-2 by FDA under an Emergency Use Authorization (EUA). This EUA will remain in effect (meaning this test can be used) for the duration of the COVID-19 declaration under Section 564(b)(1) of the Act, 21 U.S.C. section 360bbb-3(b)(1), unless the authorization is terminated or revoked.  Performed at [facility], [place] [address], [place], [place] [address]   Urine Culture     Status: None  Collection Time: 12/28/20  2:35 PM   Specimen: In/Out Cath Urine  Result Value Ref Range Status   Specimen Description   Final    IN/OUT CATH URINE Performed at [facility], [place] [address] [place], [place] [address]    Special Requests   Final    NONE Performed at [facility], [place] [address], [place], [place] [address]    Culture   Final    NO GROWTH Performed at [facility], [place] [address] [place], [place] [address]    Report Status 12/30/2020 FINAL  Final  Culture, blood (routine x 2)     Status: None   Collection Time: 12/28/20  3:06 PM   Specimen: BLOOD  Result Value Ref Range Status   Specimen Description   Final    BLOOD LEFT ANTECUBITAL Performed at [facility] [address] [place], [place] [address]    Special Requests   Final    BOTTLES DRAWN AEROBIC ONLY Blood Culture results may not be optimal due to an inadequate volume of blood received in culture bottles Performed at [facility] [address], [place], [place] [address]    Culture   Final    NO GROWTH 5 DAYS Performed at [facility], [place] [address], [place], [place] [address]    Report Status 01/02/2021 FINAL  Final  Culture, blood (routine x 2)     Status: None   Collection Time: 12/28/20  3:06 PM   Specimen: BLOOD  Result Value Ref Range Status   Specimen Description   Final    BLOOD BLOOD LEFT WRIST Performed at [place] [address], [place], [place] [address]    Special Requests   Final    BOTTLES DRAWN AEROBIC ONLY Blood Culture results may not be optimal due to an inadequate volume of blood received in culture bottles Performed at [facility] [address] [place], [place] [address]    Culture   Final    NO GROWTH 5 DAYS Performed at [facility], [place] [address], [place],  [address]    Report Status 01/02/2021 FINAL  Final  MRSA Next Gen by PCR, Nasal     Status: None   Collection Time: 12/28/20  8:18 PM   Specimen: Nasal Mucosa; Nasal Swab  Result Value Ref Range Status   MRSA by PCR Next Gen NOT DETECTED  NOT DETECTED Final    Comment: (NOTE) The GeneXpert MRSA Assay (FDA approved for NASAL specimens only), is one component of a comprehensive MRSA colonization surveillance program. It is not intended to diagnose MRSA infection nor to guide or monitor treatment for MRSA infections. Test performance is not FDA approved in patients less than 12 years old. Performed at [facility], [place] [address], [place],  [address]   Resp Panel by RT-PCR (Flu A&B, Covid) Nasopharyngeal Swab     Status: None   Collection Time: 01/02/21  1:08 PM   Specimen: Nasopharyngeal Swab; Nasopharyngeal(NP) swabs in vial transport medium  Result Value Ref Range Status   SARS Coronavirus 2 by RT PCR NEGATIVE NEGATIVE Final    Comment: (NOTE) SARS-CoV-2 target nucleic acids are NOT DETECTED.  The SARS-CoV-2 RNA is generally detectable in upper respiratory specimens during the acute phase of infection. The lowest concentration of SARS-CoV-2 viral copies this assay can detect is 138 copies/mL. A negative result does not preclude SARS-Cov-2 infection  and should not be used as the sole basis for treatment or other patient management decisions. A negative result may occur with  improper specimen collection/handling, submission of specimen other than nasopharyngeal swab, presence of viral mutation(s) within the areas targeted by this assay, and inadequate number of viral copies(<138 copies/mL). A negative result must be combined with clinical observations, patient history, and epidemiological information. The expected result is Negative.  Fact Sheet for Patients:  EntrepreneurPulse.com.au  Fact Sheet for Healthcare Providers:  IncredibleEmployment.be  This test is no t yet approved or cleared by the [place] FDA and  has been authorized for detection and/or diagnosis of SARS-CoV-2 by FDA under an Emergency Use Authorization (EUA). This EUA will remain   in effect (meaning this test can be used) for the duration of the COVID-19 declaration under Section 564(b)(1) of the Act, 21 U.S.C.section 360bbb-3(b)(1), unless the authorization is terminated  or revoked sooner.       Influenza A by PCR NEGATIVE NEGATIVE Final   Influenza B by PCR NEGATIVE NEGATIVE Final    Comment: (NOTE) The Xpert Xpress SARS-CoV-2/FLU/RSV plus assay is intended as an aid in the diagnosis of influenza from Nasopharyngeal swab specimens and should not be used as a sole basis for treatment. Nasal washings and aspirates are unacceptable for Xpert Xpress SARS-CoV-2/FLU/RSV testing.  Fact Sheet for Patients: EntrepreneurPulse.com.au  Fact Sheet for Healthcare Providers: IncredibleEmployment.be  This test is not yet approved or cleared by the [place] FDA and has been authorized for detection and/or diagnosis of SARS-CoV-2 by FDA under an Emergency Use Authorization (EUA). This EUA will remain in effect (meaning this test can be used) for the duration of the COVID-19 declaration under Section 564(b)(1) of the Act, 21 U.S.C. section 360bbb-3(b)(1), unless the authorization is terminated or revoked.  Performed at [facility], [place] [address], [place], [place] [address]   Urine Culture     Status: Abnormal   Collection Time: 01/09/21  8:07 PM   Specimen: Urine, Catheterized  Result Value Ref Range Status   Specimen Description   Final    URINE, CATHETERIZED Performed at [facility] [address], [place], [place] [address]    Special Requests   Final    NONE Performed at [facility], [place] [address], [place], [place] [address]    Culture (A)  Final    80,000 COLONIES/mL ESCHERICHIA COLI Confirmed Extended Spectrum Beta-Lactamase Producer (ESBL).  In bloodstream infections from ESBL organisms, carbapenems are preferred over piperacillin/tazobactam. They are  shown to have a lower risk of mortality.    Report Status 01/11/2021 FINAL  Final   Organism ID, Bacteria ESCHERICHIA COLI (A)  Final      Susceptibility   Escherichia coli - MIC*    AMPICILLIN >=32 RESISTANT Resistant     CEFAZOLIN >=64 RESISTANT Resistant     CEFEPIME 16 RESISTANT Resistant     CEFTRIAXONE >=64 RESISTANT Resistant     CIPROFLOXACIN <=0.25 SENSITIVE Sensitive     GENTAMICIN <=1 SENSITIVE Sensitive     IMIPENEM <=0.25 SENSITIVE Sensitive     NITROFURANTOIN 64 INTERMEDIATE Intermediate     TRIMETH/SULFA <=20 SENSITIVE Sensitive     AMPICILLIN/SULBACTAM 8 SENSITIVE Sensitive     PIP/TAZO <=4 SENSITIVE Sensitive     * 80,000 COLONIES/mL ESCHERICHIA COLI    [x]  Patient discharged originally without antimicrobial agent and treatment is now indicated  New antibiotic prescription: Give fosfomycin 3 gm X 1 at [facility]  ED Provider: Calvert Cantor, MD    Jimmy Footman, PharmD, BCPS, [place] [facility] Clinical Pharmacist Phone: [phone] 01/12/2021, 8:20 AM

## 2021-01-12 NOTE — Telephone Encounter (Signed)
Post ED Visit - Positive Culture Follow-up: Successful Patient Follow-Up  Culture assessed and recommendations reviewed by:  []  Elenor Quinones, Pharm.D. []  Heide Guile, Pharm.D., BCPS AQ-ID []  Parks Neptune, Pharm.D., BCPS []  Alycia Rossetti, Pharm.D., BCPS []  [place], Pharm.D., BCPS, AAHIVP []  Legrand Como, Pharm.D., BCPS, AAHIVP []  Salome Arnt, PharmD, BCPS []  Johnnette Gourd, PharmD, BCPS []  [place] Better, PharmD, BCPS []  Leeroy Cha, PharmD  Positive urine culture  [x]  Patient discharged without antimicrobial prescription and treatment is now indicated []  Organism is resistant to prescribed ED discharge antimicrobial []  Patient with positive blood cultures  Changes discussed with ED provider: Calvert Cantor, MD New antibiotic prescription Fosfemycin 3 gm po x 1 Faxed to [facility] @ [phone]  [facility], date 01/12/2021, time [place], [place] 01/12/2021, 8:50 AM

## 2021-01-17 ENCOUNTER — Encounter: Payer: Medicare Other | Admitting: Student in an Organized Health Care Education/Training Program

## 2021-01-23 ENCOUNTER — Encounter (HOSPITAL_COMMUNITY): Payer: Self-pay | Admitting: Radiology

## 2021-02-22 DEATH — deceased
